# Patient Record
Sex: Female | Born: 1985 | Race: White | Hispanic: No | Marital: Married | Smoking: Never smoker
Health system: Southern US, Community
[De-identification: ages and names within clinical notes are randomized; demographics above are authoritative.]

## PROBLEM LIST (undated history)

## (undated) DIAGNOSIS — E559 Vitamin D deficiency, unspecified: Secondary | ICD-10-CM

## (undated) DIAGNOSIS — G4730 Sleep apnea, unspecified: Secondary | ICD-10-CM

## (undated) DIAGNOSIS — D649 Anemia, unspecified: Secondary | ICD-10-CM

## (undated) DIAGNOSIS — M549 Dorsalgia, unspecified: Secondary | ICD-10-CM

## (undated) DIAGNOSIS — K219 Gastro-esophageal reflux disease without esophagitis: Secondary | ICD-10-CM

## (undated) DIAGNOSIS — E669 Obesity, unspecified: Secondary | ICD-10-CM

## (undated) DIAGNOSIS — M2550 Pain in unspecified joint: Secondary | ICD-10-CM

## (undated) HISTORY — PX: TONSILLECTOMY: SUR1361

## (undated) HISTORY — DX: Dorsalgia, unspecified: M54.9

## (undated) HISTORY — PX: CHOLECYSTECTOMY: SHX55

## (undated) HISTORY — DX: Anemia, unspecified: D64.9

## (undated) HISTORY — DX: Vitamin D deficiency, unspecified: E55.9

## (undated) HISTORY — DX: Gastro-esophageal reflux disease without esophagitis: K21.9

## (undated) HISTORY — PX: TUBAL LIGATION: SHX77

## (undated) HISTORY — DX: Sleep apnea, unspecified: G47.30

## (undated) HISTORY — DX: Pain in unspecified joint: M25.50

---
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
## 2007-01-09 ENCOUNTER — Inpatient Hospital Stay (HOSPITAL_COMMUNITY): Admission: AD | Admit: 2007-01-09 | Discharge: 2007-01-09 | Payer: Self-pay | Admitting: Obstetrics and Gynecology

## 2007-01-22 ENCOUNTER — Inpatient Hospital Stay (HOSPITAL_COMMUNITY): Admission: AD | Admit: 2007-01-22 | Discharge: 2007-01-22 | Payer: Self-pay | Admitting: Obstetrics and Gynecology

## 2007-01-23 ENCOUNTER — Inpatient Hospital Stay (HOSPITAL_COMMUNITY): Admission: AD | Admit: 2007-01-23 | Discharge: 2007-01-24 | Payer: Self-pay | Admitting: Obstetrics & Gynecology

## 2007-01-30 ENCOUNTER — Inpatient Hospital Stay (HOSPITAL_COMMUNITY): Admission: RE | Admit: 2007-01-30 | Discharge: 2007-02-02 | Payer: Self-pay | Admitting: Obstetrics and Gynecology

## 2007-03-14 ENCOUNTER — Emergency Department: Payer: Self-pay | Admitting: Emergency Medicine

## 2007-03-29 ENCOUNTER — Emergency Department: Payer: Self-pay | Admitting: Emergency Medicine

## 2008-01-08 ENCOUNTER — Emergency Department (HOSPITAL_BASED_OUTPATIENT_CLINIC_OR_DEPARTMENT_OTHER): Admission: EM | Admit: 2008-01-08 | Discharge: 2008-01-08 | Payer: Self-pay | Admitting: Emergency Medicine

## 2009-09-13 ENCOUNTER — Emergency Department (HOSPITAL_BASED_OUTPATIENT_CLINIC_OR_DEPARTMENT_OTHER): Admission: EM | Admit: 2009-09-13 | Discharge: 2009-09-13 | Payer: Self-pay | Admitting: Emergency Medicine

## 2010-10-06 ENCOUNTER — Emergency Department (INDEPENDENT_AMBULATORY_CARE_PROVIDER_SITE_OTHER): Payer: Medicaid Other

## 2010-10-06 ENCOUNTER — Emergency Department (HOSPITAL_BASED_OUTPATIENT_CLINIC_OR_DEPARTMENT_OTHER)
Admission: EM | Admit: 2010-10-06 | Discharge: 2010-10-07 | Disposition: A | Discharge status: Home / Self Care | Payer: Medicaid Other | Attending: Emergency Medicine | Admitting: Emergency Medicine

## 2010-10-06 DIAGNOSIS — R51 Headache: Secondary | ICD-10-CM | POA: Insufficient documentation

## 2010-10-07 LAB — CBC
HCT: 38.8 % (ref 36.0–46.0)
Hemoglobin: 13.2 g/dL (ref 12.0–15.0)
MCH: 27 pg (ref 26.0–34.0)
MCHC: 34 g/dL (ref 30.0–36.0)
MCV: 79.3 fL (ref 78.0–100.0)

## 2010-10-07 LAB — BASIC METABOLIC PANEL
BUN: 9 mg/dL (ref 6–23)
CO2: 27 mEq/L (ref 19–32)
Calcium: 9.2 mg/dL (ref 8.4–10.5)
Creatinine, Ser: 0.8 mg/dL (ref 0.4–1.2)
Glucose, Bld: 87 mg/dL (ref 70–99)
Sodium: 143 mEq/L (ref 135–145)

## 2010-10-07 LAB — DIFFERENTIAL
Eosinophils Relative: 2 % (ref 0–5)
Lymphocytes Relative: 31 % (ref 12–46)
Lymphs Abs: 3.1 10*3/uL (ref 0.7–4.0)
Monocytes Absolute: 0.5 10*3/uL (ref 0.1–1.0)
Monocytes Relative: 5 % (ref 3–12)
Neutro Abs: 6.2 10*3/uL (ref 1.7–7.7)

## 2010-10-07 LAB — URINALYSIS, ROUTINE W REFLEX MICROSCOPIC
Ketones, ur: NEGATIVE mg/dL
Nitrite: NEGATIVE
Protein, ur: 30 mg/dL — AB

## 2010-10-07 LAB — URINE MICROSCOPIC-ADD ON

## 2010-10-07 LAB — PREGNANCY, URINE: Preg Test, Ur: NEGATIVE

## 2010-12-14 NOTE — Op Note (Signed)
NAME:  JORI, FRERICHS NO.:  [id number]   MEDICAL RECORD NO.:  [id number]          PATIENT TYPE:  INP   LOCATION:  9147                          FACILITY:  [facility]   PHYSICIAN:  Malva Limes, M.D.    DATE OF BIRTH:  10-17-1985   DATE OF PROCEDURE:  01/31/2007  DATE OF DISCHARGE:                               OPERATIVE REPORT   PREOPERATIVE DIAGNOSES:  1. Intrauterine pregnancy at term.  2. Failure to progress.   POSTOPERATIVE DIAGNOSES:  1. Intrauterine pregnancy at term.  2. Failure to progress.   PROCEDURE:  Primary low transverse cesarean section.   SURGEON:  Malva Limes, M.D.   ANESTHESIA:  Epidural.   ANTIBIOTIC:  Ancef one gram.   DRAINS:  Foley to bedside drainage.   ESTIMATED BLOOD LOSS:  900 mL.   COMPLICATIONS:  None.   SPECIMENS:  None.   FINDINGS:  The patient had normal fallopian tubes and ovaries  bilaterally.  The patient delivered one live, viable, white female infant,  weighing 7 pounds even, Apgars 9 at one minute and 10 at five minutes.   DESCRIPTION OF PROCEDURE:  The patient was taken to the operating room,  where she was placed in the dorsal supine position with a left lateral  tilt.  Once adequate level was reached, the patient was prepped with  Betadine and draped in the usual fashion for this procedure.  A  Pfannenstiel incision was made.  This was carried down through the  fascia.  The fascia was entered in the midline and extended laterally  with blunt dissection.  The fascia was then dissected from the rectus  muscles with the Bovie.  Rectus muscles were divided in the midline,  taken superiorly and inferiorly.  Parietal peritoneum was entered  sharply and taken superiorly and inferiorly.  The bladder flap was taken  down with sharp dissection.  A low transverse uterine incision was made  in the midline and extended laterally with blunt dissection.  Amniotic  fluid was noted to be clear.  The infant was delivered in the  vertex  presentation with the vacuum extractor.  There was a nuchal cord times  one.  Once the infant was delivered, the cord was doubly clamped and cut  and the infant handed to the waiting NICU team.  The placenta was then  manually removed.   The uterus was exteriorized.  The uterine cavity was cleaned with a wet  lap.  The uterine incision was closed with a single layer of 0 Monocryl  in a running, locking fashion.  The bladder flap was closed, using 2-0  Monocryl in a running fashion.  The uterus was placed back in the  abdominal cavity.  Hemostasis was checked and felt to be adequate.  Parietal peritoneum and rectus muscles were reapproximated in the  midline, using 0 Monocryl suture in a running fashion.  The fascia was  closed using 0 Monocryl suture in running fashion.  Subcuticular tissue  was made hemostatic with Bovie.  The subcuticular tissue, which was  rather thick, was closed using interrupted 2-0 plain gut suture.  Stainless steel clips were used to close the skin.   The patient tolerated the procedure well.  She was taken to the recovery  room in stable condition.  Instrument and lap counts were correct times  two.           ______________________________  Malva Limes, M.D.     MA/MEDQ  D:  01/31/2007  T:  01/31/2007  Job:  [id number]

## 2010-12-17 NOTE — Discharge Summary (Signed)
Deborah Mcbride, Deborah Mcbride NO.:  [id number]   MEDICAL RECORD NO.:  [id number]          PATIENT TYPE:  INP   LOCATION:  9147                          FACILITY:  [facility]   PHYSICIAN:  Ilda Mori, M.D.   DATE OF BIRTH:  [birth year]   DATE OF ADMISSION:  01/30/2007  DATE OF DISCHARGE:  02/02/2007                               DISCHARGE SUMMARY   FINAL DIAGNOSIS:  Intrauterine pregnancy at term, induction secondary to  possible macrosomia.  Failure to progress.   PROCEDURE:  Primary low transverse cesarean section.  Surgeon Dr. Malva Limes.   COMPLICATIONS:  None.   HISTORY OF PRESENT ILLNESS:  This 25 year old G1 P0 presents at [redacted] weeks  gestation for induction secondary to possible macrosomia.  The patient's  antepartum course up to this point had been complicated by late prenatal  care which she did not have any prenatal care until about 20 weeks.  She  is also a single female.  The patient did have a normal one-hour glucose  tolerance test and negative group B strep culture at 35 weeks.  The  patient was admitted on July 1 by Dr. Malva Limes.  She progressed to  about 6 cm of dilation.  Did not change over a four hour period.  A  discussion was held with the patient, and decision was made to proceed  with a cesarean section at this time secondary to failure to progress.  The patient was taken to the operating room on January 31, 2007, by Dr. Malva Limes where a primary low transverse cesarean section was performed  with the delivery of a 7 pounds 0 ounces female infant with Apgars of 9  and 10.  Delivery went without complications.  The patient's  postoperative course was benign without complications.  The patient does  not want her little boy circumcised before discharge.  The patient was  felt ready for discharge on postoperative day #3, was sent home on a  regular diet, told to decrease her activities, told to continue her  prenatal vitamins, was given Percocet  #36 one to two every 46 hours as  needed for the pain, told she could use over-the-counter ibuprofen up to  600 mg every 6 hours as needed for pain and was to follow up in our  office in 4 weeks.   LABORATORIES ON DISCHARGE:  The patient had a hemoglobin of 10.0, white  blood cell count of 14.1 and platelets of 2226,000.      Leilani Able, P.A.-C.      Ilda Mori, M.D.  Electronically Signed    MB/MEDQ  D:  03/07/2007  T:  03/07/2007  Job:  [id number]

## 2010-12-17 NOTE — Consult Note (Signed)
NAME:  Deborah Mcbride, Deborah Mcbride NO.:  [id number]   MEDICAL RECORD NO.:  [id number]          PATIENT TYPE:  OIB   LOCATION:  LDR2                          FACILITY:  [facility]   PHYSICIAN:  Tilda Burrow, M.D. DATE OF BIRTH:  [birth year]   DATE OF CONSULTATION:  10/29/2006  DATE OF DISCHARGE:                                 CONSULTATION   CHIEF COMPLAINT:  Epigastric pain x24 hours, mild lower abdominal pain.   HISTORY OF PRESENT ILLNESS:  This 26 year old gravida 1, para 0, EDC  February 01, 2007 at 26 weeks 3 days by LMP criteria.  She presents to [facility] labor and delivery complaining of epigastric discomfort  and lower abdominal discomfort noted while she was in church today at  the [employer] [facility] during services.  She describes the  service itself was uneventful.  The pain has been going on and off for  24 hours, became acutely worse during the service.  No bleeding, no  rupture of membranes.  The pain has resolved since she arrived here.  External monitoring shows a wonderfully reactive fetal heart rate  pattern.  No uterine activity.  Fetal heart rate is within normal range.  There is no bleeding, no gush of fluid.  In discussing with the patient,  the pain is not associated with bowel activity.  The lower abdominal  discomfort is very nonspecific in the inguinal area as noted.  Abdomen  is nontender, gravid uterus consistent with gestational age.  Pelvic  exam deferred.  Blood pressure is 136/58.   IMPRESSION:  1. Round ligament pain lower abdomen.  2. Epigastric discomfort, left upper quadrant predominance.  Probably      heartburn.   PLAN:  Maalox to be given prior to discharge as well as single dose  Protonix.  The patient is advised to use Maalox p.r.n. heartburn to sort  out etiology.  Return for headaches, right quadrant pain of a severe  nature.  Follow-up with Dr. Dareen Piano of [facility] next week if  symptoms persist.      Tilda Burrow, M.D.  Electronically Signed     JVF/MEDQ  D:  10/29/2006  T:  10/29/2006  Job:  [id number]   cc:   [facility]   Malva Limes, M.D.  Fax: [phone]

## 2011-04-28 LAB — URINALYSIS, ROUTINE W REFLEX MICROSCOPIC
Bilirubin Urine: NEGATIVE
Glucose, UA: NEGATIVE
Hgb urine dipstick: NEGATIVE
Protein, ur: NEGATIVE

## 2011-04-28 LAB — URINE MICROSCOPIC-ADD ON

## 2011-04-28 LAB — GC/CHLAMYDIA PROBE AMP, GENITAL: GC Probe Amp, Genital: NEGATIVE

## 2011-04-28 LAB — WET PREP, GENITAL: Trich, Wet Prep: NONE SEEN

## 2011-05-17 LAB — CBC
HCT: 29.2 — ABNORMAL LOW
Hemoglobin: 11.6 — ABNORMAL LOW
MCHC: 34
MCV: 81.6
MCV: 81.7
Platelets: 226
RBC: 4.17
RDW: 13.8

## 2011-05-18 LAB — URINALYSIS, ROUTINE W REFLEX MICROSCOPIC
Bilirubin Urine: NEGATIVE
Ketones, ur: NEGATIVE
Nitrite: NEGATIVE
Urobilinogen, UA: 0.2

## 2016-01-17 ENCOUNTER — Emergency Department (HOSPITAL_BASED_OUTPATIENT_CLINIC_OR_DEPARTMENT_OTHER): Payer: Medicaid Other

## 2016-01-17 ENCOUNTER — Emergency Department (HOSPITAL_BASED_OUTPATIENT_CLINIC_OR_DEPARTMENT_OTHER)
Admission: EM | Admit: 2016-01-17 | Discharge: 2016-01-18 | Disposition: A | Discharge status: Home / Self Care | Payer: Medicaid Other | Attending: Emergency Medicine | Admitting: Emergency Medicine

## 2016-01-17 ENCOUNTER — Encounter (HOSPITAL_BASED_OUTPATIENT_CLINIC_OR_DEPARTMENT_OTHER): Payer: Self-pay | Admitting: Emergency Medicine

## 2016-01-17 DIAGNOSIS — M25541 Pain in joints of right hand: Secondary | ICD-10-CM | POA: Diagnosis not present

## 2016-01-17 DIAGNOSIS — M79641 Pain in right hand: Secondary | ICD-10-CM | POA: Diagnosis present

## 2016-01-17 DIAGNOSIS — M2550 Pain in unspecified joint: Secondary | ICD-10-CM

## 2016-01-17 MED ORDER — HYDROCODONE-ACETAMINOPHEN 5-325 MG PO TABS
1.0000 | ORAL_TABLET | ORAL | Status: AC
  Administered 2016-01-17: 1 via ORAL
  Filled 2016-01-17: qty 1

## 2016-01-17 NOTE — ED Provider Notes (Signed)
CSN: [id number]     Arrival date & time 01/17/16  2200 History  By signing my name below, I, Marisue HumbleMichelle Chaffee, attest that this documentation has been prepared under the direction and in the presence of Linwood DibblesJon Trayonna Bachmeier, MD . Electronically Signed: Marisue HumbleMichelle Chaffee, Scribe. 01/17/2016. 11:02 PM.   Chief Complaint  Patient presents with  . Hand Pain   The history is provided by the patient. No language interpreter was used.   HPI Comments:  Neta MendsHeather D Englert is a 30 y.o. female who presents to the Emergency Department complaining of generalized right hand pain radiating up to right shoulder onset earlier today. Pt states she has not been using the hand more than normal. No alleviating factors noted or treatments attempted PTA. Pt is right handed. Denies injury, trauma, fever or chills.  History reviewed. No pertinent past medical history. Past Surgical History  Procedure Laterality Date  . Cholecystectomy    . Cesarean section    . Tubal ligation    . Tonsillectomy     History reviewed. No pertinent family history. Social History  Substance Use Topics  . Smoking status: Never Smoker   . Smokeless tobacco: None  . Alcohol Use: No   OB History    No data available     Review of Systems  Constitutional: Negative for fever and chills.  Musculoskeletal: Positive for arthralgias.  All other systems reviewed and are negative.   Allergies  Dilaudid  Home Medications   Prior to Admission medications   Medication Sig Start Date End Date Taking? Authorizing Provider  naproxen (NAPROSYN) 500 MG tablet Take 1 tablet (500 mg total) by mouth 2 (two) times daily. 01/18/16   Linwood DibblesJon Davidmichael Zarazua, MD   BP 130/44 mmHg  Pulse 80  Temp(Src) 98.2 F (36.8 C) (Oral)  Resp 20  Ht 5\' 3"  (1.6 m)  Wt 140.615 kg  BMI 54.93 kg/m2  SpO2 99%  LMP 12/21/2015   Physical Exam  Constitutional: She appears well-developed and well-nourished. No distress.  HENT:  Head: Normocephalic and atraumatic.  Right Ear:  External ear normal.  Left Ear: External ear normal.  Eyes: Conjunctivae are normal. Right eye exhibits no discharge. Left eye exhibits no discharge. No scleral icterus.  Neck: Neck supple. No tracheal deviation present.  Cardiovascular: Normal rate.   Pulmonary/Chest: Effort normal. No stridor. No respiratory distress.  Musculoskeletal: She exhibits no edema.       Right shoulder: Normal.       Right elbow: Normal.      Right wrist: She exhibits tenderness. She exhibits no swelling and no effusion.       Right hand: She exhibits tenderness. She exhibits no deformity, no laceration and no swelling.  No swelling, normal radial pulses  Neurological: She is alert. Cranial nerve deficit: no gross deficits.  Skin: Skin is warm and dry. No rash noted.  Psychiatric: She has a normal mood and affect.  Nursing note and vitals reviewed.   ED Course  Procedures  DIAGNOSTIC STUDIES:  Oxygen Saturation is 100% on RA, normal by my interpretation.    COORDINATION OF CARE:  10:59 PM Will order imaging. Discussed treatment plan with pt at bedside and pt agreed to plan.  Labs Review Labs Reviewed - No data to display  Imaging Review Dg Wrist Complete Right  01/18/2016  CLINICAL DATA:  30 year old female with right wrist pain EXAM: RIGHT HAND - COMPLETE 3+ VIEW; RIGHT WRIST - COMPLETE 3+ VIEW COMPARISON:  None. FINDINGS: There is no  evidence of fracture or dislocation. There is no evidence of arthropathy or other focal bone abnormality. Soft tissues are unremarkable. IMPRESSION: Negative. Electronically Signed   By: Elgie Collard M.D.   On: 01/18/2016 00:33   Dg Hand Complete Right  01/18/2016  CLINICAL DATA:  30 year old female with right wrist pain EXAM: RIGHT HAND - COMPLETE 3+ VIEW; RIGHT WRIST - COMPLETE 3+ VIEW COMPARISON:  None. FINDINGS: There is no evidence of fracture or dislocation. There is no evidence of arthropathy or other focal bone abnormality. Soft tissues are unremarkable.  IMPRESSION: Negative. Electronically Signed   By: Elgie Collard M.D.   On: 01/18/2016 00:33   I have personally reviewed and evaluated these images and lab results as part of my medical decision-making.    MDM   Final diagnoses:  Arthralgia   Normal pulses.  No sign of infection.  Doubt referred pain since she has tenderness to palpation.  Denies any recent injuries.  ?tendonities vs arthralgia  Will dc home on nsaids.  Follow up with PCP  I personally performed the services described in this documentation, which was scribed in my presence.  The recorded information has been reviewed and is accurate.    Linwood Dibbles, MD 01/18/16 0040

## 2016-01-17 NOTE — ED Notes (Signed)
Patient states that she started to have pain to her right hand starting in the middle of the day. Denies any injury reports " it just started hurting"

## 2016-01-18 MED ORDER — NAPROXEN 500 MG PO TABS: 500.0000 mg | ORAL_TABLET | Freq: Two times a day (BID) | ORAL | Status: DC

## 2016-01-18 NOTE — Discharge Instructions (Signed)

## 2016-02-14 ENCOUNTER — Encounter (HOSPITAL_BASED_OUTPATIENT_CLINIC_OR_DEPARTMENT_OTHER): Payer: Self-pay | Admitting: *Deleted

## 2016-02-14 ENCOUNTER — Emergency Department (HOSPITAL_BASED_OUTPATIENT_CLINIC_OR_DEPARTMENT_OTHER)
Admission: EM | Admit: 2016-02-14 | Discharge: 2016-02-15 | Disposition: A | Discharge status: Home / Self Care | Payer: Medicaid Other | Attending: Emergency Medicine | Admitting: Emergency Medicine

## 2016-02-14 DIAGNOSIS — K029 Dental caries, unspecified: Secondary | ICD-10-CM | POA: Diagnosis not present

## 2016-02-14 DIAGNOSIS — K0889 Other specified disorders of teeth and supporting structures: Secondary | ICD-10-CM | POA: Diagnosis present

## 2016-02-14 MED ORDER — NAPROXEN 250 MG PO TABS
500.0000 mg | ORAL_TABLET | Freq: Once | ORAL | Status: AC
  Administered 2016-02-14: 500 mg via ORAL
  Filled 2016-02-14: qty 2

## 2016-02-14 MED ORDER — PENICILLIN V POTASSIUM 250 MG PO TABS
500.0000 mg | ORAL_TABLET | Freq: Once | ORAL | Status: AC
  Administered 2016-02-14: 500 mg via ORAL
  Filled 2016-02-14: qty 2

## 2016-02-14 MED ORDER — LIDOCAINE VISCOUS 2 % MT SOLN
15.0000 mL | Freq: Once | OROMUCOSAL | Status: AC
  Administered 2016-02-14: 15 mL via OROMUCOSAL
  Filled 2016-02-14: qty 15

## 2016-02-14 NOTE — ED Provider Notes (Signed)
CSN: [id number]     Arrival date & time 02/14/16  2127 History  By signing my name below, I, Deborah Mcbride, attest that this documentation has been prepared under the direction and in the presence of Wynonia Medero, MD . Electronically Signed: Levon HedgerElizabeth Mcbride, Scribe. 02/14/2016. 11:39 PM.   Chief Complaint  Patient presents with  . Dental Pain   Patient is a 30 y.o. female presenting with tooth pain. The history is provided by the patient. No language interpreter was used.  Dental Pain Location:  Upper Upper teeth location:  15/LU 2nd molar Quality:  Dull Severity:  Severe Onset quality:  Gradual Timing:  Constant Progression:  Unchanged Chronicity:  Recurrent Context: dental caries   Previous work-up:  Dental exam and filled cavity Relieved by:  Nothing Worsened by:  Nothing tried Ineffective treatments:  None tried Associated symptoms: no congestion, no facial swelling and no fever   Risk factors: no diabetes     HPI Comments:  Deborah Mcbride is a 30 y.o. female who presents to the Emergency Department complaining of left sided upper dental pain with radiation to entire mouth onset yesterday. Pain worsened by movement. Pt notes associated difficulty swallowing. She states she has a broken tooth that was previously filled and capped, but the cap broke. No other complaints at this time.  History reviewed. No pertinent past medical history. Past Surgical History  Procedure Laterality Date  . Cholecystectomy    . Cesarean section    . Tubal ligation    . Tonsillectomy     History reviewed. No pertinent family history. Social History  Substance Use Topics  . Smoking status: Never Smoker   . Smokeless tobacco: None  . Alcohol Use: No   OB History    No data available     Review of Systems  Constitutional: Negative for fever.  HENT: Positive for dental problem and trouble swallowing. Negative for congestion and facial swelling.   All other systems reviewed and are  negative.   Allergies  Dilaudid  Home Medications   Prior to Admission medications   Not on File   BP 158/92 mmHg  Pulse 90  Temp(Src) 98.5 F (36.9 C) (Oral)  Resp 18  Ht 5\' 3"  (1.6 m)  Wt 300 lb (136.079 kg)  BMI 53.16 kg/m2  SpO2 98%  LMP 01/29/2016 Physical Exam  Constitutional: She is oriented to person, place, and time. She appears well-developed and well-nourished. No distress.  HENT:  Head: Normocephalic and atraumatic.  Mouth/Throat: Oropharynx is clear and moist. No trismus in the jaw. Dental caries present. No oropharyngeal exudate.  Moist mucous membranes  Left upper 2nd molar ellis tooth fracture  Eyes: Conjunctivae are normal. Pupils are equal, round, and reactive to light.  Neck: No JVD present.  Trachea midline No lymphadenopathy No bruit  Cardiovascular: Normal rate, regular rhythm and normal heart sounds.   Pulmonary/Chest: Effort normal and breath sounds normal. No stridor. No respiratory distress.  Abdominal: Soft. Bowel sounds are normal. She exhibits no distension.  Neurological: She is alert and oriented to person, place, and time. She has normal reflexes.  Skin: Skin is warm and dry.  Psychiatric: She has a normal mood and affect. Her behavior is normal.  Nursing note and vitals reviewed.   ED Course  Procedures  DIAGNOSTIC STUDIES:  Oxygen Saturation is 98% on RA, normal by my interpretation.    COORDINATION OF CARE:  11:36 PM Discussed treatment plan with pt at bedside and pt agreed to  plan.  Labs Review Labs Reviewed - No data to display  Imaging Review No results found. I have personally reviewed and evaluated these images and lab results as part of my medical decision-making.   EKG Interpretation None      MDM   Final diagnoses:  None    Filed Vitals:   02/14/16 2146 02/14/16 2340  BP: 158/92 137/69  Pulse: 90 80  Temp: 98.5 F (36.9 C) 98.1 F (36.7 C)  Resp: 18 20   Medications  penicillin v potassium  (VEETID) tablet 500 mg (500 mg Oral Given 02/14/16 2346)  naproxen (NAPROSYN) tablet 500 mg (500 mg Oral Given 02/14/16 2347)  lidocaine (XYLOCAINE) 2 % viscous mouth solution 15 mL (15 mLs Mouth/Throat Given 02/14/16 2348)     Medication List    TAKE these medications        meloxicam 15 MG tablet  Commonly known as:  MOBIC  Take 1 tablet (15 mg total) by mouth daily.     penicillin v potassium 500 MG tablet  Commonly known as:  VEETID  Take 1 tablet (500 mg total) by mouth 4 (four) times daily.      All questions answered to patient's satisfaction. Based on history and exam patient has been appropriately medically screened and emergency conditions excluded. Patient is stable for discharge at this time. Follow up provided and strict return precautions given  I personally performed the services described in this documentation, which was scribed in my presence. The recorded information has been reviewed and is accurate.     Cy Blamer, MD 02/15/16 [phone]

## 2016-02-14 NOTE — ED Notes (Signed)
MD at bedside. 

## 2016-02-14 NOTE — ED Notes (Signed)
Dental pain x 2 days. No relief with OTC meds.

## 2016-02-14 NOTE — ED Notes (Signed)
Dental pain from a broken tooth x 2 days.

## 2016-02-15 MED ORDER — MELOXICAM 15 MG PO TABS: 15.0000 mg | ORAL_TABLET | Freq: Every day | ORAL | Status: DC

## 2016-02-15 MED ORDER — PENICILLIN V POTASSIUM 500 MG PO TABS: 500.0000 mg | ORAL_TABLET | Freq: Four times a day (QID) | ORAL | Status: AC

## 2016-02-15 NOTE — Discharge Instructions (Signed)
Dental Care and Dentist Visits °Dental care supports good overall health. Regular dental visits can also help you avoid dental pain, bleeding, infection, and other more serious health problems in the future. It is important to keep the mouth healthy because diseases in the teeth, gums, and other oral tissues can spread to other areas of the body. Some problems, such as diabetes, heart disease, and pre-term labor have been associated with poor oral health.  °See your dentist every 6 months. If you experience emergency problems such as a toothache or broken tooth, go to the dentist right away. If you see your dentist regularly, you may catch problems early. It is easier to be treated for problems in the early stages.  °WHAT TO EXPECT AT A DENTIST VISIT  °Your dentist will look for many common oral health problems and recommend proper treatment. At your regular dental visit, you can expect: °· Gentle cleaning of the teeth and gums. This includes scraping and polishing. This helps to remove the sticky substance around the teeth and gums (plaque). Plaque forms in the mouth shortly after eating. Over time, plaque hardens on the teeth as tartar. If tartar is not removed regularly, it can cause problems. Cleaning also helps remove stains. °· Periodic X-rays. These pictures of the teeth and supporting bone will help your dentist assess the health of your teeth. °· Periodic fluoride treatments. Fluoride is a natural mineral shown to help strengthen teeth. Fluoride treatment involves applying a fluoride gel or varnish to the teeth. It is most commonly done in children. °· Examination of the mouth, tongue, jaws, teeth, and gums to look for any oral health problems, such as: °¨ Cavities (dental caries). This is decay on the tooth caused by plaque, sugar, and acid in the mouth. It is best to catch a cavity when it is small. °¨ Inflammation of the gums caused by plaque buildup (gingivitis). °¨ Problems with the mouth or malformed  or misaligned teeth. °¨ Oral cancer or other diseases of the soft tissues or jaws.  °KEEP YOUR TEETH AND GUMS HEALTHY °For healthy teeth and gums, follow these general guidelines as well as your dentist's specific advice: °· Have your teeth professionally cleaned at the dentist every 6 months. °· Brush twice daily with a fluoride toothpaste. °· Floss your teeth daily.  °· Ask your dentist if you need fluoride supplements, treatments, or fluoride toothpaste. °· Eat a healthy diet. Reduce foods and drinks with added sugar. °· Avoid smoking. °TREATMENT FOR ORAL HEALTH PROBLEMS °If you have oral health problems, treatment varies depending on the conditions present in your teeth and gums. °· Your caregiver will most likely recommend good oral hygiene at each visit. °· For cavities, gingivitis, or other oral health disease, your caregiver will perform a procedure to treat the problem. This is typically done at a separate appointment. Sometimes your caregiver will refer you to another dental specialist for specific tooth problems or for surgery. °SEEK IMMEDIATE DENTAL CARE IF: °· You have pain, bleeding, or soreness in the gum, tooth, jaw, or mouth area. °· A permanent tooth becomes loose or separated from the gum socket. °· You experience a blow or injury to the mouth or jaw area. °  °This information is not intended to replace advice given to you by your health care provider. Make sure you discuss any questions you have with your health care provider. °  °Document Released: 03/30/2011 Document Revised: 10/10/2011 Document Reviewed: 03/30/2011 °Elsevier Interactive Patient Education ©2016 [employer] ° °

## 2016-02-15 NOTE — ED Notes (Signed)
Pt given d/c instructions as per chart. Rx x 2. Verbalizes understanding. No questions. 

## 2016-05-16 ENCOUNTER — Encounter (HOSPITAL_BASED_OUTPATIENT_CLINIC_OR_DEPARTMENT_OTHER): Payer: Self-pay

## 2016-05-16 ENCOUNTER — Emergency Department (HOSPITAL_BASED_OUTPATIENT_CLINIC_OR_DEPARTMENT_OTHER): Payer: Medicaid Other

## 2016-05-16 ENCOUNTER — Emergency Department (HOSPITAL_BASED_OUTPATIENT_CLINIC_OR_DEPARTMENT_OTHER)
Admission: EM | Admit: 2016-05-16 | Discharge: 2016-05-17 | Disposition: A | Discharge status: Home / Self Care | Payer: Medicaid Other | Attending: Emergency Medicine | Admitting: Emergency Medicine

## 2016-05-16 DIAGNOSIS — N390 Urinary tract infection, site not specified: Secondary | ICD-10-CM | POA: Insufficient documentation

## 2016-05-16 DIAGNOSIS — R1031 Right lower quadrant pain: Secondary | ICD-10-CM | POA: Diagnosis present

## 2016-05-16 DIAGNOSIS — N3000 Acute cystitis without hematuria: Secondary | ICD-10-CM

## 2016-05-16 DIAGNOSIS — I880 Nonspecific mesenteric lymphadenitis: Secondary | ICD-10-CM

## 2016-05-16 LAB — URINALYSIS, ROUTINE W REFLEX MICROSCOPIC
Bilirubin Urine: NEGATIVE
GLUCOSE, UA: NEGATIVE mg/dL
HGB URINE DIPSTICK: NEGATIVE
Ketones, ur: NEGATIVE mg/dL
Nitrite: NEGATIVE
PROTEIN: NEGATIVE mg/dL
SPECIFIC GRAVITY, URINE: 1.021 (ref 1.005–1.030)
pH: 7.5 (ref 5.0–8.0)

## 2016-05-16 LAB — BASIC METABOLIC PANEL
ANION GAP: 8 (ref 5–15)
BUN: 10 mg/dL (ref 6–20)
CALCIUM: 9.4 mg/dL (ref 8.9–10.3)
CHLORIDE: 103 mmol/L (ref 101–111)
CO2: 25 mmol/L (ref 22–32)
Creatinine, Ser: 0.72 mg/dL (ref 0.44–1.00)
GFR calc Af Amer: >60 mL/min (ref >60)
GFR calc non Af Amer: >60 mL/min (ref >60)
GLUCOSE: 92 mg/dL (ref 65–99)
POTASSIUM: 3.7 mmol/L (ref 3.5–5.1)
Sodium: 136 mmol/L (ref 135–145)

## 2016-05-16 LAB — URINE MICROSCOPIC-ADD ON

## 2016-05-16 LAB — CBC WITH DIFFERENTIAL/PLATELET
BAND NEUTROPHILS: 2 %
BASOS ABS: 0 10*3/uL (ref 0.0–0.1)
BASOS PCT: 0 %
Eosinophils Absolute: 0.2 10*3/uL (ref 0.0–0.7)
Eosinophils Relative: 2 %
HEMATOCRIT: 39.5 % (ref 36.0–46.0)
Hemoglobin: 13.1 g/dL (ref 12.0–15.0)
LYMPHS ABS: 3.5 10*3/uL (ref 0.7–4.0)
LYMPHS PCT: 30 %
MCH: 27.3 pg (ref 26.0–34.0)
MCHC: 33.2 g/dL (ref 30.0–36.0)
MCV: 82.5 fL (ref 78.0–100.0)
Metamyelocytes Relative: 2 %
Monocytes Absolute: 0.6 10*3/uL (ref 0.1–1.0)
Monocytes Relative: 5 %
Neutro Abs: 7.5 10*3/uL (ref 1.7–7.7)
Neutrophils Relative %: 59 %
Platelets: 409 10*3/uL — ABNORMAL HIGH (ref 150–400)
RBC: 4.79 MIL/uL (ref 3.87–5.11)
RDW: 15.4 % (ref 11.5–15.5)
WBC: 11.8 10*3/uL — ABNORMAL HIGH (ref 4.0–10.5)

## 2016-05-16 LAB — PREGNANCY, URINE: Preg Test, Ur: NEGATIVE

## 2016-05-16 MED ORDER — IOPAMIDOL (ISOVUE-300) INJECTION 61%
100.0000 mL | Freq: Once | INTRAVENOUS | Status: AC | PRN
  Administered 2016-05-16: 100 mL via INTRAVENOUS

## 2016-05-16 MED ORDER — CEPHALEXIN 500 MG PO CAPS: 500.0000 mg | ORAL_CAPSULE | Freq: Two times a day (BID) | ORAL | 0 refills | Status: DC

## 2016-05-16 NOTE — Discharge Instructions (Signed)
Take antibiotics and as prescribed for potential urinary tract infection.  Return for worsening symptoms, including fever, worsening pain, intractable vomiting or any other symptoms concerning to you.

## 2016-05-16 NOTE — ED Triage Notes (Signed)
Pt c/o lower abdominal pain for the last few days with painful urination.  No n/v/d, also c/o lower back pain that is worse with sitting or standing for long periods of time.  Pt denies vaginal discharge and fevers as well.

## 2016-05-16 NOTE — ED Provider Notes (Signed)
[facility] Provider Note   CSN: [id number] Arrival date & time: 05/16/16  2029  By signing my name below, I, Soijett Blue, attest that this documentation has been prepared under the direction and in the presence of Lavera Guise, MD. Electronically Signed: Soijett Blue, ED Scribe. 05/16/16. 9:24 PM.   History   Chief Complaint Chief Complaint  Patient presents with  . Abdominal Pain    HPI  Deborah Mcbride is a 30 y.o. female who presents to the Emergency Department complaining of 7/10, constant, RLQ abdominal pain onset 2 days. Pt denies having an UTI in the past. She states that she is having associated symptoms of lower back pain and abdominal pain with urination. She states that she has not tried any medications for the relief of her symptoms. She denies diarrhea, vaginal bleeding, vaginal discharge, fever, urinary frequency, and any other symptoms. Pt reports that she has had two C-sections and a tubal ligation.    The history is provided by the patient. No language interpreter was used.    History reviewed. No pertinent past medical history.  There are no active problems to display for this patient.   Past Surgical History:  Procedure Laterality Date  . CESAREAN SECTION    . CHOLECYSTECTOMY    . TONSILLECTOMY    . TUBAL LIGATION      OB History    No data available       Home Medications    Prior to Admission medications   Medication Sig Start Date End Date Taking? Authorizing Provider  cephALEXin (KEFLEX) 500 MG capsule Take 1 capsule (500 mg total) by mouth 2 (two) times daily. 05/16/16   Lavera Guise, MD  meloxicam (MOBIC) 15 MG tablet Take 1 tablet (15 mg total) by mouth daily. 02/15/16   April Palumbo, MD    Family History No family history on file.  Social History Social History  Substance Use Topics  . Smoking status: Never Smoker  . Smokeless tobacco: Not on file  . Alcohol use No     Allergies   Dilaudid [hydromorphone  hcl]   Review of Systems Review of Systems 10/14 systems reviewed and are negative other than those stated in the HPI  Physical Exam Updated Vital Signs BP 109/59 (BP Location: Right Arm)   Pulse 80   Temp 98.1 F (36.7 C) (Oral)   Resp 19   Ht 5\' 3"  (1.6 m)   Wt (!) 322 lb 8 oz (146.3 kg)   LMP 03/23/2016 Comment: (-) urine preg//a.c.  SpO2 97%   BMI 57.13 kg/m   Physical Exam Physical Exam  Nursing note and vitals reviewed. Constitutional: Well developed, well nourished, non-toxic, and in no acute distress Head: Normocephalic and atraumatic.  Mouth/Throat: Oropharynx is clear and moist.  Neck: Normal range of motion. Neck supple.  Cardiovascular: Normal rate and regular rhythm.   Pulmonary/Chest: Effort normal and breath sounds normal.  Abdominal: Soft. There is no tenderness. There is no rebound and no guarding. Non-distended. Bilateral lower abdominal TTP. Bilateral flank tenderness to palpation. No significant CVA tenderenss.  Musculoskeletal: Normal range of motion.  Neurological: Alert, no facial droop, fluent speech, moves all extremities symmetrically Skin: Skin is warm and dry.  Psychiatric: Cooperative  ED Treatments / Results  DIAGNOSTIC STUDIES: Oxygen Saturation is 98% on RA, nl by my interpretation.    COORDINATION OF CARE: 9:23 PM Discussed treatment plan with pt at bedside which includes UA, labs, CT abdomen pelvis, and pt  agreed to plan.   Labs (all labs ordered are listed, but only abnormal results are displayed) Labs Reviewed  URINALYSIS, ROUTINE W REFLEX MICROSCOPIC (NOT AT [facility]) - Abnormal; Notable for the following:       Result Value   APPearance CLOUDY (*)    Leukocytes, UA MODERATE (*)    All other components within normal limits  URINE MICROSCOPIC-ADD ON - Abnormal; Notable for the following:    Squamous Epithelial / LPF 6-30 (*)    Bacteria, UA MANY (*)    All other components within normal limits  CBC WITH DIFFERENTIAL/PLATELET -  Abnormal; Notable for the following:    WBC 11.8 (*)    Platelets 409 (*)    All other components within normal limits  URINE CULTURE  PREGNANCY, URINE  BASIC METABOLIC PANEL    Radiology Ct Abdomen Pelvis W Contrast  Result Date: 05/17/2016 CLINICAL DATA:  Right lower quadrant, suprapubic and bilateral flank pain for 3 days. EXAM: CT ABDOMEN AND PELVIS WITH CONTRAST TECHNIQUE: Multidetector CT imaging of the abdomen and pelvis was performed using the standard protocol following bolus administration of intravenous contrast. CONTRAST:  100mL ISOVUE-300 IOPAMIDOL (ISOVUE-300) INJECTION 61% COMPARISON:  08/16/2011 CT FINDINGS: LOWER CHEST: Lung bases are clear. Included heart size is normal. No pericardial effusion. HEPATOBILIARY: Hepatic steatosis without space-occupying mass. Cholecystectomy. No biliary dilatation. PANCREAS: Normal. SPLEEN: Normal. ADRENALS/URINARY TRACT: Kidneys are orthotopic, demonstrating symmetric enhancement. No nephrolithiasis, hydronephrosis or solid renal masses. The unopacified ureters are normal in course and caliber. Urinary bladder is partially distended and unremarkable. Normal adrenal glands. STOMACH/BOWEL: The stomach, small and large bowel are normal in course and caliber without inflammatory changes. The appendix is not visualized overt no pericecal inflammation is noted. VASCULAR/LYMPHATIC: No aortic aneurysm. No significant calcific atherosclerosis. Small mesenteric lymph nodes are stable in appearance in the right lower quadrant the largest approximately 11 mm. REPRODUCTIVE: Normal. OTHER: No intraperitoneal free fluid or free air. MUSCULOSKELETAL: Chronic bilateral pars defects at L5. No spondylolisthesis. IMPRESSION: 1. Hepatic steatosis. 2. Cholecystectomy. 3. Small right lower quadrant mesenteric lymph nodes. Mesenteric adenitis is not entirely excluded. 4. Appendix is not visualized but no pericecal inflammation is noted. Electronically Signed   By: Tollie Ethavid  Kwon  M.D.   On: 05/17/2016 00:16    Procedures Procedures (including critical care time)  Medications Ordered in ED Medications  iopamidol (ISOVUE-300) 61 % injection 100 mL (100 mLs Intravenous Contrast Given 05/16/16 2312)     Initial Impression / Assessment and Plan / ED Course  I have reviewed the triage vital signs and the nursing notes.  Pertinent labs & imaging results that were available during my care of the patient were reviewed by me and considered in my medical decision making (see chart for details).  Clinical Course    Presenting with 2 days of RLQ abdominal pain with some urinary complaints. Afebrile and hemodynamically stable. Blood work with mild leukocytosis. Abdomen soft and nonsurgical with suprapubic and RLQ pain.  UA with signs of infection. Will obtain CT abd/pelvis to rule out acute appendicitis. If negative will treat outpatient for UTI with course of keflex.   Final Clinical Impressions(s) / ED Diagnoses   Final diagnoses:  Acute cystitis without hematuria  Lower urinary tract infectious disease  Mesenteric adenitis    New Prescriptions Discharge Medication List as of 05/17/2016 12:24 AM    START taking these medications   Details  cephALEXin (KEFLEX) 500 MG capsule Take 1 capsule (500 mg total) by mouth 2 (two)  times daily., Starting Mon 05/16/2016, Print        I personally performed the services described in this documentation, which was scribed in my presence. The recorded information has been reviewed and is accurate.     Lavera Guise, MD 05/17/16 [phone]

## 2016-05-18 LAB — URINE CULTURE

## 2017-01-19 ENCOUNTER — Encounter (HOSPITAL_BASED_OUTPATIENT_CLINIC_OR_DEPARTMENT_OTHER): Payer: Self-pay | Admitting: *Deleted

## 2017-01-19 DIAGNOSIS — K0889 Other specified disorders of teeth and supporting structures: Secondary | ICD-10-CM | POA: Diagnosis present

## 2017-01-19 DIAGNOSIS — Z5321 Procedure and treatment not carried out due to patient leaving prior to being seen by health care provider: Secondary | ICD-10-CM | POA: Diagnosis not present

## 2017-01-19 NOTE — ED Triage Notes (Signed)
Pt c/o dental pain x 3 days.

## 2017-01-20 ENCOUNTER — Emergency Department (HOSPITAL_BASED_OUTPATIENT_CLINIC_OR_DEPARTMENT_OTHER)
Admission: EM | Admit: 2017-01-20 | Discharge: 2017-01-20 | Disposition: A | Discharge status: Home / Self Care | Payer: Medicaid Other | Attending: Emergency Medicine | Admitting: Emergency Medicine

## 2017-05-15 ENCOUNTER — Emergency Department (HOSPITAL_BASED_OUTPATIENT_CLINIC_OR_DEPARTMENT_OTHER)
Admission: EM | Admit: 2017-05-15 | Discharge: 2017-05-15 | Disposition: A | Discharge status: Home / Self Care | Payer: Medicaid Other | Attending: Emergency Medicine | Admitting: Emergency Medicine

## 2017-05-15 ENCOUNTER — Encounter (HOSPITAL_BASED_OUTPATIENT_CLINIC_OR_DEPARTMENT_OTHER): Payer: Self-pay

## 2017-05-15 DIAGNOSIS — M5489 Other dorsalgia: Secondary | ICD-10-CM | POA: Diagnosis not present

## 2017-05-15 DIAGNOSIS — M549 Dorsalgia, unspecified: Secondary | ICD-10-CM

## 2017-05-15 MED ORDER — KETOROLAC TROMETHAMINE 60 MG/2ML IM SOLN
60.0000 mg | Freq: Once | INTRAMUSCULAR | Status: DC
  Filled 2017-05-15: qty 2

## 2017-05-15 MED ORDER — IBUPROFEN 200 MG PO TABS
600.0000 mg | ORAL_TABLET | Freq: Once | ORAL | Status: AC
  Administered 2017-05-15: 600 mg via ORAL
  Filled 2017-05-15: qty 1

## 2017-05-15 MED ORDER — PREDNISONE 20 MG PO TABS: 40.0000 mg | ORAL_TABLET | Freq: Every day | ORAL | 0 refills | Status: AC

## 2017-05-15 MED ORDER — PREDNISONE 50 MG PO TABS
60.0000 mg | ORAL_TABLET | Freq: Once | ORAL | Status: AC
  Administered 2017-05-15: 60 mg via ORAL
  Filled 2017-05-15: qty 1

## 2017-05-15 MED ORDER — IBUPROFEN 400 MG PO TABS: 400.0000 mg | ORAL_TABLET | Freq: Three times a day (TID) | ORAL | 0 refills | Status: DC | PRN

## 2017-05-15 MED ORDER — CYCLOBENZAPRINE HCL 5 MG PO TABS
5.0000 mg | ORAL_TABLET | Freq: Once | ORAL | Status: AC
  Administered 2017-05-15: 5 mg via ORAL
  Filled 2017-05-15: qty 1

## 2017-05-15 MED ORDER — OXYCODONE-ACETAMINOPHEN 5-325 MG PO TABS
1.0000 | ORAL_TABLET | Freq: Once | ORAL | Status: AC
  Administered 2017-05-15: 1 via ORAL
  Filled 2017-05-15: qty 1

## 2017-05-15 MED ORDER — CYCLOBENZAPRINE HCL 10 MG PO TABS: 10.0000 mg | ORAL_TABLET | Freq: Three times a day (TID) | ORAL | 0 refills | Status: DC | PRN

## 2017-05-15 NOTE — ED Provider Notes (Signed)
[facility]   CSN: [id number] Arrival date & time: 05/15/17  1104     History   Chief Complaint Chief Complaint  Patient presents with  . Back Pain    HPI Deborah Mcbride is a 31 y.o. female.  HPI Patient said 31 year old female who reports 4 days of worsening right-sided back pain including both her upper and lower back.  No recent injury or heavy lifting.  Denies fevers and chills.  Denies weakness of her lower extremities. No history of IV drug abuse. o use of anticoagulants.  No recent back surgery. No chest pain or shortness of breath.  No recent cough.  Denies dysuria or urinary frequency.  No vaginal complaints. Patient reports that she is intermittently had back pain before but reports this is more severe than usual.    History reviewed. No pertinent past medical history.  There are no active problems to display for this patient.   Past Surgical History:  Procedure Laterality Date  . CESAREAN SECTION    . CHOLECYSTECTOMY    . TONSILLECTOMY    . TUBAL LIGATION      OB History    No data available       Home Medications    Prior to Admission medications   Medication Sig Start Date End Date Taking? Authorizing Provider  cyclobenzaprine (FLEXERIL) 10 MG tablet Take 1 tablet (10 mg total) by mouth 3 (three) times daily as needed for muscle spasms. 05/15/17   Azalia Bilis, MD  ibuprofen (ADVIL,MOTRIN) 400 MG tablet Take 1 tablet (400 mg total) by mouth every 8 (eight) hours as needed. 05/15/17   Azalia Bilis, MD  predniSONE (DELTASONE) 20 MG tablet Take 2 tablets (40 mg total) by mouth daily. 05/15/17 05/20/17  Azalia Bilis, MD    Family History No family history on file.  Social History Social History  Substance Use Topics  . Smoking status: Never Smoker  . Smokeless tobacco: Never Used  . Alcohol use No     Allergies   Dilaudid [hydromorphone hcl]   Review of Systems Review of Systems  All other systems reviewed  and are negative.    Physical Exam Updated Vital Signs BP (!) 122/57 (BP Location: Right Arm)   Pulse 87   Temp 98.3 F (36.8 C) (Oral)   Resp 20   Ht  (1.6 m)   Wt (!) 144.2 kg (318 lb)   LMP 04/21/2017   SpO2 99%   BMI 56.33 kg/m   Physical Exam  Constitutional: She is oriented to person, place, and time. She appears well-developed and well-nourished.  Morbidly obese  HENT:  Head: Normocephalic.  Eyes: EOM are normal.  Neck: Normal range of motion.  Cardiovascular: Normal rate.   Pulmonary/Chest: Effort normal and breath sounds normal.  Abdominal: She exhibits no distension. There is no tenderness.  Musculoskeletal:  full range of motion of right knee and right hip.  Normal pulses right foot.  Mild right sided parathoracic and paralumbar tenderness without significant spasm. No rash.  No erythema  Neurological: She is alert and oriented to person, place, and time.  Psychiatric: She has a normal mood and affect.  Nursing note and vitals reviewed.    ED Treatments / Results  Labs (all labs ordered are listed, but only abnormal results are displayed) Labs Reviewed - No data to display  EKG  EKG Interpretation None       Radiology No results found.  Procedures Procedures (including critical care  time)  Medications Ordered in ED Medications  ketorolac (TORADOL) injection 60 mg (not administered)  predniSONE (DELTASONE) tablet 60 mg (not administered)  cyclobenzaprine (FLEXERIL) tablet 5 mg (not administered)  oxyCODONE-acetaminophen (PERCOCET/ROXICET) 5-325 MG per tablet 1 tablet (not administered)     Initial Impression / Assessment and Plan / ED Course  I have reviewed the triage vital signs and the nursing notes.  Pertinent labs & imaging results that were available during my care of the patient were reviewed by me and considered in my medical decision making (see chart for details).     Appears to be muscle skeletal upper lower back pain.   Home with anti-inflammatories and muscle relaxants as well as a short course of steroids for inflammation.  Pain treated in the emergency department.  No weakness of her lower extremities.  No thoracic or abdominal complaints  Patient understands return to the ER for new or worsening symptoms.  Recommended close primary care follow-up.  Final Clinical Impressions(s) / ED Diagnoses   Final diagnoses:  Upper back pain    New Prescriptions New Prescriptions   CYCLOBENZAPRINE (FLEXERIL) 10 MG TABLET    Take 1 tablet (10 mg total) by mouth 3 (three) times daily as needed for muscle spasms.   IBUPROFEN (ADVIL,MOTRIN) 400 MG TABLET    Take 1 tablet (400 mg total) by mouth every 8 (eight) hours as needed.   PREDNISONE (DELTASONE) 20 MG TABLET    Take 2 tablets (40 mg total) by mouth daily.     Azalia Bilis, MD 05/15/17 1149

## 2017-05-15 NOTE — ED Triage Notes (Signed)
C/o pain to entire back x 4 days-denies injury-ambulated into ED presents to triage in w/c

## 2017-06-11 ENCOUNTER — Encounter (HOSPITAL_BASED_OUTPATIENT_CLINIC_OR_DEPARTMENT_OTHER): Payer: Self-pay | Admitting: Emergency Medicine

## 2017-06-11 ENCOUNTER — Other Ambulatory Visit: Payer: Self-pay

## 2017-06-11 ENCOUNTER — Emergency Department (HOSPITAL_BASED_OUTPATIENT_CLINIC_OR_DEPARTMENT_OTHER)
Admission: EM | Admit: 2017-06-11 | Discharge: 2017-06-11 | Disposition: A | Discharge status: Home / Self Care | Payer: Medicaid Other | Attending: Emergency Medicine | Admitting: Emergency Medicine

## 2017-06-11 DIAGNOSIS — J3489 Other specified disorders of nose and nasal sinuses: Secondary | ICD-10-CM

## 2017-06-11 LAB — RAPID STREP SCREEN (MED CTR MEBANE ONLY): STREPTOCOCCUS, GROUP A SCREEN (DIRECT): NEGATIVE

## 2017-06-11 MED ORDER — SALINE SPRAY 0.65 % NA SOLN: 1.0000 | NASAL | 0 refills | Status: DC | PRN

## 2017-06-11 MED ORDER — FLUTICASONE PROPIONATE 50 MCG/ACT NA SUSP: 2.0000 | Freq: Every day | NASAL | 2 refills | Status: DC

## 2017-06-11 NOTE — Discharge Instructions (Signed)
You were seen today for congestion and upper respiratory symptoms.  You may have some sinus pressure.  Use Flonase and nasal saline to drain the sinuses.  You can use over-the-counter medications for systemic relief.  If you develop fevers or worsening symptoms she may need to be reevaluated.  At this time no antibiotics are indicated.  This is likely a viral infection.

## 2017-06-11 NOTE — ED Provider Notes (Signed)
[facility] EMERGENCY DEPARTMENT Provider Note   CSN: [id number] Arrival date & time: 06/11/17  2132     History   Chief Complaint No chief complaint on file.   HPI Deborah Mcbride is a 31 y.o. female.  HPI  This is a 31 year old female who presents with congestion, sinus pressure, and neck pain.  Patient reports 2-3-day history of worsening sinus congestion.  She has noted today that she feels like her neck is swollen.  Mild sore throat.  No fevers.  She took Sudafed, ibuprofen, and Tylenol with minimal relief.  No difficulty swallowing.  She denies any chest pain, shortness of breath, abdominal pain, nausea, vomiting, diarrhea.  She does endorse dry cough.  History reviewed. No pertinent past medical history.  There are no active problems to display for this patient.   Past Surgical History:  Procedure Laterality Date  . CESAREAN SECTION    . CHOLECYSTECTOMY    . TONSILLECTOMY    . TUBAL LIGATION      OB History    No data available       Home Medications    Prior to Admission medications   Medication Sig Start Date End Date Taking? Authorizing Provider  cyclobenzaprine (FLEXERIL) 10 MG tablet Take 1 tablet (10 mg total) by mouth 3 (three) times daily as needed for muscle spasms. 05/15/17   Azalia Bilisampos, Kevin, MD  fluticasone (FLONASE) 50 MCG/ACT nasal spray Place 2 sprays daily into both nostrils. 06/11/17   Horton, Mayer Maskerourtney F, MD  ibuprofen (ADVIL,MOTRIN) 400 MG tablet Take 1 tablet (400 mg total) by mouth every 8 (eight) hours as needed. 05/15/17   Azalia Bilisampos, Kevin, MD  sodium chloride (OCEAN) 0.65 % SOLN nasal spray Place 1 spray as needed into both nostrils for congestion. 06/11/17   Horton, Mayer Maskerourtney F, MD    Family History No family history on file.  Social History Social History   Tobacco Use  . Smoking status: Never Smoker  . Smokeless tobacco: Never Used  Substance Use Topics  . Alcohol use: No  . Drug use: No     Allergies   Dilaudid  [hydromorphone hcl]   Review of Systems Review of Systems  Constitutional: Negative for fever.  HENT: Positive for congestion, sinus pressure and sore throat. Negative for trouble swallowing.   Respiratory: Positive for cough. Negative for shortness of breath.   Cardiovascular: Negative for chest pain.  Gastrointestinal: Negative for abdominal pain, diarrhea and vomiting.  All other systems reviewed and are negative.    Physical Exam Updated Vital Signs BP (!) 148/67 (BP Location: Left Arm)   Pulse 81   Temp 97.8 F (36.6 C) (Oral)   Resp 17   LMP 05/28/2017   SpO2 98%   Physical Exam  Constitutional: She is oriented to person, place, and time. She appears well-developed and well-nourished.  Obese, no acute distress  HENT:  Head: Normocephalic and atraumatic.  Right Ear: External ear normal.  Left Ear: External ear normal.  Status post tonsillectomy, uvula midline, no significant erythema noted, postnasal drip noted, no fullness noted under the tongue  Eyes: Pupils are equal, round, and reactive to light.  Neck: Neck supple.  Bilateral symmetric cervical adenopathy  Cardiovascular: Normal rate, regular rhythm and normal heart sounds.  Pulmonary/Chest: Effort normal and breath sounds normal. No respiratory distress. She has no wheezes.  Abdominal: Soft. There is no tenderness.  Lymphadenopathy:    She has cervical adenopathy.  Neurological: She is alert and oriented to person,  place, and time.  Skin: Skin is warm and dry.  Psychiatric: She has a normal mood and affect.  Nursing note and vitals reviewed.    ED Treatments / Results  Labs (all labs ordered are listed, but only abnormal results are displayed) Labs Reviewed  RAPID STREP SCREEN (NOT AT [facility])  CULTURE, GROUP A STREP [facility])    EKG  EKG Interpretation None       Radiology No results found.  Procedures Procedures (including critical care time)  Medications Ordered in ED Medications - No data  to display   Initial Impression / Assessment and Plan / ED Course  I have reviewed the triage vital signs and the nursing notes.  Pertinent labs & imaging results that were available during my care of the patient were reviewed by me and considered in my medical decision making (see chart for details).     Patient presents with upper respiratory symptoms and sinus pressure.  Likely related to a viral illness.  She is nontoxic appearing.  Vital signs are reassuring.  Afebrile.  No suggestion on exam of deep space infection of the throat or neck.  Recommend supportive measures including continued ibuprofen or Tylenol.  Flonase and nasal saline for congestion and sinus pressure.  No indication at this time for antibiotics.  Patient was advised if symptoms persist for 10 days or more she should be reevaluated have indications for antibiotics at that time.  After history, exam, and medical workup I feel the patient has been appropriately medically screened and is safe for discharge home. Pertinent diagnoses were discussed with the patient. Patient was given return precautions.   Final Clinical Impressions(s) / ED Diagnoses   Final diagnoses:  Sinus pressure    ED Discharge Orders        Ordered    fluticasone (FLONASE) 50 MCG/ACT nasal spray  Daily     06/11/17 2317    sodium chloride (OCEAN) 0.65 % SOLN nasal spray  As needed     06/11/17 2317       Horton, Mayer Maskerourtney F, MD 06/11/17 2324

## 2017-06-11 NOTE — ED Notes (Signed)
Pt given d/c instructions as per chart. Rx x 2. Verbalizes understanding. No questions. 

## 2017-06-11 NOTE — ED Triage Notes (Signed)
PT presents with c/o neck pain she states from her sinus. No c/o sore throat.

## 2017-06-12 ENCOUNTER — Emergency Department (HOSPITAL_COMMUNITY)
Admission: EM | Admit: 2017-06-12 | Discharge: 2017-06-12 | Disposition: A | Discharge status: Home / Self Care | Payer: Medicaid Other | Attending: Emergency Medicine | Admitting: Emergency Medicine

## 2017-06-12 ENCOUNTER — Encounter (HOSPITAL_COMMUNITY): Payer: Self-pay | Admitting: Emergency Medicine

## 2017-06-12 DIAGNOSIS — B9789 Other viral agents as the cause of diseases classified elsewhere: Secondary | ICD-10-CM | POA: Insufficient documentation

## 2017-06-12 DIAGNOSIS — J069 Acute upper respiratory infection, unspecified: Secondary | ICD-10-CM | POA: Diagnosis not present

## 2017-06-12 DIAGNOSIS — R05 Cough: Secondary | ICD-10-CM | POA: Diagnosis present

## 2017-06-12 MED ORDER — BENZONATATE 100 MG PO CAPS: 100.0000 mg | ORAL_CAPSULE | Freq: Three times a day (TID) | ORAL | 0 refills | Status: DC

## 2017-06-12 NOTE — ED Triage Notes (Signed)
Per pt, states [facility]-states she is worse-states voice is hoarse and throat still hurts

## 2017-06-12 NOTE — Discharge Instructions (Signed)
Please read attached information. If you experience any new or worsening signs or symptoms please return to the emergency room for evaluation. Please follow-up with your primary care provider or specialist as discussed. Please use medication prescribed only as directed and discontinue taking if you have any concerning signs or symptoms.   °

## 2017-06-12 NOTE — ED Provider Notes (Signed)
[facility]-EMERGENCY DEPT Provider Note   CSN: [id number] Arrival date & time: 06/12/17  1030     History   Chief Complaint Chief Complaint  Patient presents with  . URI    HPI Deborah Mcbride is a 31 y.o. female.  HPI   31 year old female presents today with complaints of upper respiratory infection.  Patient notes that for the last 3 days she has had rhinorrhea, congestion, sore throat and cough.  She notes she was seen yesterday at urgent care, was diagnosed with viral upper respiratory infection and discharge.  She notes using Sudafed last night which did not improve her symptoms.  No medications prior to arrival.  Patient without any shortness of breath, fever, difficulty swallowing.  She does note a history of allergies, not currently taking any antihistamines.  History reviewed. No pertinent past medical history.  There are no active problems to display for this patient.   Past Surgical History:  Procedure Laterality Date  . CESAREAN SECTION    . CHOLECYSTECTOMY    . TONSILLECTOMY    . TUBAL LIGATION      OB History    No data available       Home Medications    Prior to Admission medications   Medication Sig Start Date End Date Taking? Authorizing Provider  benzonatate (TESSALON) 100 MG capsule Take 1 capsule (100 mg total) every 8 (eight) hours by mouth. 06/12/17   Dashawn Golda, Tinnie GensJeffrey, PA-C  cyclobenzaprine (FLEXERIL) 10 MG tablet Take 1 tablet (10 mg total) by mouth 3 (three) times daily as needed for muscle spasms. 05/15/17   Azalia Bilisampos, Kevin, MD  fluticasone (FLONASE) 50 MCG/ACT nasal spray Place 2 sprays daily into both nostrils. 06/11/17   Horton, Mayer Maskerourtney F, MD  ibuprofen (ADVIL,MOTRIN) 400 MG tablet Take 1 tablet (400 mg total) by mouth every 8 (eight) hours as needed. 05/15/17   Azalia Bilisampos, Kevin, MD  sodium chloride (OCEAN) 0.65 % SOLN nasal spray Place 1 spray as needed into both nostrils for congestion. 06/11/17   Horton, Mayer Maskerourtney F,  MD    Family History No family history on file.  Social History Social History   Tobacco Use  . Smoking status: Never Smoker  . Smokeless tobacco: Never Used  Substance Use Topics  . Alcohol use: No  . Drug use: No     Allergies   Dilaudid [hydromorphone hcl]   Review of Systems Review of Systems  All other systems reviewed and are negative.    Physical Exam Updated Vital Signs BP (!) 146/94 (BP Location: Left Arm)   Pulse 69   Temp 97.8 F (36.6 C) (Oral)   Resp 18   LMP 05/28/2017   SpO2 100%   Physical Exam  Constitutional: She is oriented to person, place, and time. She appears well-developed and well-nourished.  HENT:  Head: Normocephalic and atraumatic.  Surgically absent tonsils, no swelling or edema, no exudate, no pooling of secretions  Tender anterior cervical lymphadenopathy  Bilateral TMs normal  Eyes: Conjunctivae are normal. Pupils are equal, round, and reactive to light. Right eye exhibits no discharge. Left eye exhibits no discharge. No scleral icterus.  Neck: Normal range of motion. No JVD present. No tracheal deviation present.  Pulmonary/Chest: Effort normal and breath sounds normal. No stridor. No respiratory distress. She has no wheezes. She has no rales. She exhibits no tenderness.  Neurological: She is alert and oriented to person, place, and time. Coordination normal.  Psychiatric: She has a normal mood and  affect. Her behavior is normal. Judgment and thought content normal.  Nursing note and vitals reviewed.    ED Treatments / Results  Labs (all labs ordered are listed, but only abnormal results are displayed) Labs Reviewed - No data to display  EKG  EKG Interpretation None       Radiology No results found.  Procedures Procedures (including critical care time)  Medications Ordered in ED Medications - No data to display   Initial Impression / Assessment and Plan / ED Course  I have reviewed the triage vital signs  and the nursing notes.  Pertinent labs & imaging results that were available during my care of the patient were reviewed by me and considered in my medical decision making (see chart for details).      Final Clinical Impressions(s) / ED Diagnoses   Final diagnoses:  Viral URI with cough    31 year old female presents today with 3-day history of upper respiratory infection.  This is most likely viral in nature.  She is afebrile with no signs of systemic illness.  I find no objective findings on her exam.  Patient was seen in urgent care yesterday diagnosed with the same, rapid strep at that time..  Patient is requesting antibiotics, had a lengthy counseling sensation with her on the appropriate use of antibiotics and strict return precautions.  Patient verbalized understanding and agreement to today's plan had no further questions or concerns the time discharge.  ED Discharge Orders        Ordered    benzonatate (TESSALON) 100 MG capsule  Every 8 hours     06/12/17 1130         Eyvonne MechanicHedges, Aqsa Sensabaugh, PA-C 06/12/17 1139    Rolland PorterJames, Mark, MD 06/18/17 [phone]

## 2017-06-14 LAB — CULTURE, GROUP A STREP (THRC)

## 2017-09-10 ENCOUNTER — Emergency Department (HOSPITAL_BASED_OUTPATIENT_CLINIC_OR_DEPARTMENT_OTHER)
Admission: EM | Admit: 2017-09-10 | Discharge: 2017-09-10 | Disposition: A | Discharge status: Home / Self Care | Payer: Medicaid Other | Attending: Emergency Medicine | Admitting: Emergency Medicine

## 2017-09-10 ENCOUNTER — Encounter (HOSPITAL_BASED_OUTPATIENT_CLINIC_OR_DEPARTMENT_OTHER): Payer: Self-pay | Admitting: *Deleted

## 2017-09-10 ENCOUNTER — Emergency Department (HOSPITAL_BASED_OUTPATIENT_CLINIC_OR_DEPARTMENT_OTHER): Payer: Medicaid Other

## 2017-09-10 ENCOUNTER — Other Ambulatory Visit: Payer: Self-pay

## 2017-09-10 DIAGNOSIS — Z79899 Other long term (current) drug therapy: Secondary | ICD-10-CM | POA: Diagnosis not present

## 2017-09-10 DIAGNOSIS — J209 Acute bronchitis, unspecified: Secondary | ICD-10-CM | POA: Diagnosis not present

## 2017-09-10 DIAGNOSIS — J208 Acute bronchitis due to other specified organisms: Secondary | ICD-10-CM

## 2017-09-10 DIAGNOSIS — R030 Elevated blood-pressure reading, without diagnosis of hypertension: Secondary | ICD-10-CM | POA: Insufficient documentation

## 2017-09-10 DIAGNOSIS — R05 Cough: Secondary | ICD-10-CM | POA: Diagnosis present

## 2017-09-10 MED ORDER — AEROCHAMBER PLUS W/MASK MISC
1.0000 | Freq: Once | Status: AC
  Administered 2017-09-10: 1
  Filled 2017-09-10: qty 1

## 2017-09-10 MED ORDER — ALBUTEROL SULFATE HFA 108 (90 BASE) MCG/ACT IN AERS
2.0000 | INHALATION_SPRAY | RESPIRATORY_TRACT | Status: DC | PRN
  Administered 2017-09-10: 2 via RESPIRATORY_TRACT
  Filled 2017-09-10: qty 6.7

## 2017-09-10 NOTE — ED Triage Notes (Signed)
Pt c/o URi sytmpoms x 3 weeks

## 2017-09-10 NOTE — ED Provider Notes (Signed)
[facility] EMERGENCY DEPARTMENT Provider Note   CSN: [id number] Arrival date & time: 09/10/17  1104     History   Chief Complaint Chief Complaint  Patient presents with  . URI    HPI Deborah Mcbride is a 32 y.o. female.  HPI Complains of cough for 3 weeks.  She reports that cough is productive of green sputum until a week ago now cough is dry.  She denies any shortness of breath denies fever.  She treated herself with Robitussin without relief.  She denies sneeze or rhinorrhea.  Denies fever.  Denies other associated symptoms History reviewed. No pertinent past medical history.  There are no active problems to display for this patient.   Past Surgical History:  Procedure Laterality Date  . CESAREAN SECTION    . CHOLECYSTECTOMY    . TONSILLECTOMY    . TUBAL LIGATION      OB History    No data available       Home Medications    Prior to Admission medications   Medication Sig Start Date End Date Taking? Authorizing Provider  sodium chloride (OCEAN) 0.65 % SOLN nasal spray Place 1 spray as needed into both nostrils for congestion. 06/11/17   Horton, Mayer Maskerourtney F, MD    Family History History reviewed. No pertinent family history.  Social History Social History   Tobacco Use  . Smoking status: Never Smoker  . Smokeless tobacco: Never Used  Substance Use Topics  . Alcohol use: No  . Drug use: No     Allergies   Dilaudid [hydromorphone hcl]   Review of Systems Review of Systems  Constitutional: Negative.   HENT: Negative.   Respiratory: Positive for cough.   Cardiovascular: Negative.   Gastrointestinal: Negative.   Musculoskeletal: Negative.   Skin: Negative.   Neurological: Negative.   Psychiatric/Behavioral: Negative.   All other systems reviewed and are negative.    Physical Exam Updated Vital Signs BP (!) 151/98   Pulse 89   Temp 98.1 F (36.7 C)   Resp 20   Ht 5\' 3"  (1.6 m)   Wt (!) 144.2 kg (318 lb)   LMP 08/31/2017    SpO2 96%   BMI 56.33 kg/m   Physical Exam  Constitutional: She appears well-developed and well-nourished.  HENT:  Head: Normocephalic and atraumatic.  Eyes: Conjunctivae are normal. Pupils are equal, round, and reactive to light.  Neck: Neck supple. No tracheal deviation present. No thyromegaly present.  Cardiovascular: Normal rate and regular rhythm.  No murmur heard. Pulmonary/Chest: Effort normal and breath sounds normal.  Coughing occasionally  Abdominal: Soft. Bowel sounds are normal. She exhibits no distension. There is no tenderness.  Morbidly obese  Musculoskeletal: Normal range of motion. She exhibits no edema or tenderness.  Neurological: She is alert. Coordination normal.  Skin: Skin is warm and dry. No rash noted.  Psychiatric: She has a normal mood and affect.  Nursing note and vitals reviewed.    ED Treatments / Results  Labs (all labs ordered are listed, but only abnormal results are displayed) Labs Reviewed - No data to display  EKG  EKG Interpretation None       Radiology No results found.  Procedures Procedures (including critical care time)  Medications Ordered in ED Medications - No data to display  Chest x-ray viewed by me. Results for orders placed or performed during the hospital encounter of 06/11/17  Rapid strep screen  Result Value Ref Range   Streptococcus, Group A  Screen (Direct) NEGATIVE NEGATIVE  Culture, group A strep  Result Value Ref Range   Specimen Description THROAT    Special Requests NONE Reflexed from [id number]    Culture      NO GROUP A STREP (S.PYOGENES) ISOLATED Performed at [facility], [address], [place], [place] [address]    Report Status 06/14/2017 FINAL    Dg Chest 2 View  Result Date: 09/10/2017 CLINICAL DATA:  URI symptoms for 3 weeks EXAM: CHEST  2 VIEW COMPARISON:  06/09/2014 FINDINGS: Normal heart size and mediastinal contours. No acute infiltrate or edema. No effusion or pneumothorax. No acute  osseous findings. IMPRESSION: Negative chest. Electronically Signed   By: Marnee Spring M.D.   On: 09/10/2017 13:44   Initial Impression / Assessment and Plan / ED Course  I have reviewed the triage vital signs and the nursing notes.  Pertinent labs & imaging results that were available during my care of the patient were reviewed by me and considered in my medical decision making (see chart for details).     Plan she will receive an albuterol HFA with spacer to go to use 2 puffs every 4 hours as needed for cough or shortness of breath.  She is to keep her scheduled appointment with her PCP on September 14, 2017.Marland Kitchen  Blood pressure recheck  Final Clinical Impressions(s) / ED Diagnoses  dx #1 acute bronchitis #2 elevated blood pressure Final diagnoses:  None    ED Discharge Orders    None       Doug Sou, MD 09/10/17 [phone]

## 2017-09-10 NOTE — Discharge Instructions (Signed)
Use your inhaler with spacer 2 puffs every 4 hours as needed for cough or shortness of breath.  Keep your scheduled appointment with your primary care physician on September 14, 2017.  Ask your doctor to repeat your blood pressure.  Today's was mildly elevated at 136/86

## 2017-10-01 ENCOUNTER — Emergency Department (HOSPITAL_COMMUNITY)
Admission: EM | Admit: 2017-10-01 | Discharge: 2017-10-02 | Disposition: A | Discharge status: Home / Self Care | Payer: Medicaid Other | Attending: Emergency Medicine | Admitting: Emergency Medicine

## 2017-10-01 ENCOUNTER — Encounter (HOSPITAL_COMMUNITY): Payer: Self-pay | Admitting: Emergency Medicine

## 2017-10-01 ENCOUNTER — Other Ambulatory Visit: Payer: Self-pay

## 2017-10-01 DIAGNOSIS — R109 Unspecified abdominal pain: Secondary | ICD-10-CM | POA: Diagnosis present

## 2017-10-01 DIAGNOSIS — Z885 Allergy status to narcotic agent status: Secondary | ICD-10-CM | POA: Diagnosis not present

## 2017-10-01 NOTE — ED Notes (Signed)
Bed: [id number] Expected date:  Expected time:  Means of arrival:  Comments: 31yo F/ Flank pain

## 2017-10-01 NOTE — ED Triage Notes (Signed)
Pt brought inj by EMS from home with c/o right flank pain, no N/V/D.  Pt denies hx of kidney stones.

## 2017-10-02 ENCOUNTER — Emergency Department (HOSPITAL_COMMUNITY): Payer: Medicaid Other

## 2017-10-02 LAB — URINALYSIS, ROUTINE W REFLEX MICROSCOPIC
Bilirubin Urine: NEGATIVE
GLUCOSE, UA: NEGATIVE mg/dL
HGB URINE DIPSTICK: NEGATIVE
Ketones, ur: NEGATIVE mg/dL
LEUKOCYTES UA: NEGATIVE
Nitrite: NEGATIVE
Protein, ur: NEGATIVE mg/dL
SPECIFIC GRAVITY, URINE: 1.019 (ref 1.005–1.030)
pH: 7 (ref 5.0–8.0)

## 2017-10-02 LAB — LIPASE, BLOOD: Lipase: 18 U/L (ref 11–51)

## 2017-10-02 LAB — CBC WITH DIFFERENTIAL/PLATELET
BASOS ABS: 0.1 10*3/uL (ref 0.0–0.1)
BASOS PCT: 1 %
EOS ABS: 0.2 10*3/uL (ref 0.0–0.7)
EOS PCT: 2 %
HCT: 39.7 % (ref 36.0–46.0)
Hemoglobin: 13 g/dL (ref 12.0–15.0)
Lymphocytes Relative: 27 %
Lymphs Abs: 3.5 10*3/uL (ref 0.7–4.0)
MCH: 27.3 pg (ref 26.0–34.0)
MCHC: 32.7 g/dL (ref 30.0–36.0)
MCV: 83.2 fL (ref 78.0–100.0)
MONO ABS: 0.8 10*3/uL (ref 0.1–1.0)
Monocytes Relative: 6 %
Neutro Abs: 8.5 10*3/uL — ABNORMAL HIGH (ref 1.7–7.7)
Neutrophils Relative %: 64 %
PLATELETS: 440 10*3/uL — AB (ref 150–400)
RBC: 4.77 MIL/uL (ref 3.87–5.11)
RDW: 15 % (ref 11.5–15.5)
WBC: 13.1 10*3/uL — AB (ref 4.0–10.5)

## 2017-10-02 LAB — COMPREHENSIVE METABOLIC PANEL
ALBUMIN: 3.7 g/dL (ref 3.5–5.0)
ALT: 15 U/L (ref 14–54)
AST: 18 U/L (ref 15–41)
Alkaline Phosphatase: 71 U/L (ref 38–126)
Anion gap: 9 (ref 5–15)
BUN: 11 mg/dL (ref 6–20)
CHLORIDE: 106 mmol/L (ref 101–111)
CO2: 24 mmol/L (ref 22–32)
CREATININE: 0.8 mg/dL (ref 0.44–1.00)
Calcium: 9.2 mg/dL (ref 8.9–10.3)
GFR calc Af Amer: >60 mL/min (ref >60)
GFR calc non Af Amer: >60 mL/min (ref >60)
GLUCOSE: 96 mg/dL (ref 65–99)
POTASSIUM: 3.8 mmol/L (ref 3.5–5.1)
SODIUM: 139 mmol/L (ref 135–145)
Total Bilirubin: 0.3 mg/dL (ref 0.3–1.2)
Total Protein: 6.7 g/dL (ref 6.5–8.1)

## 2017-10-02 LAB — I-STAT BETA HCG BLOOD, ED (MC, WL, AP ONLY): I-stat hCG, quantitative: <5 m[IU]/mL (ref <5)

## 2017-10-02 MED ORDER — KETOROLAC TROMETHAMINE 30 MG/ML IJ SOLN
30.0000 mg | Freq: Once | INTRAMUSCULAR | Status: AC
  Administered 2017-10-02: 30 mg via INTRAVENOUS
  Filled 2017-10-02: qty 1

## 2017-10-02 MED ORDER — TRAMADOL HCL 50 MG PO TABS: 50.0000 mg | ORAL_TABLET | Freq: Four times a day (QID) | ORAL | 0 refills | Status: DC | PRN

## 2017-10-02 MED ORDER — DICYCLOMINE HCL 20 MG PO TABS: 20.0000 mg | ORAL_TABLET | Freq: Three times a day (TID) | ORAL | 0 refills | Status: DC

## 2017-10-02 NOTE — ED Provider Notes (Signed)
[facility]-EMERGENCY DEPT Provider Note   CSN: [id number] Arrival date & time: 10/01/17  2341     History   Chief Complaint Chief Complaint  Patient presents with  . Flank Pain    HPI Deborah Mcbride is a 32 y.o. female.  Patient presents to the emergency department for evaluation of right flank pain.  Patient reports that symptoms have been present since early this morning.  She reports continuous pain.  Pain worsens with movements.  She has not noticed any urinary symptoms.  Pain goes from the right lower back around her side to the groin area.  No associated fever, nausea, vomiting, diarrhea or constipation.  She has never had a kidney stone.      History reviewed. No pertinent past medical history.  There are no active problems to display for this patient.   Past Surgical History:  Procedure Laterality Date  . CESAREAN SECTION    . CHOLECYSTECTOMY    . TONSILLECTOMY    . TUBAL LIGATION      OB History    No data available       Home Medications    Prior to Admission medications   Medication Sig Start Date End Date Taking? Authorizing Provider  dicyclomine (BENTYL) 20 MG tablet Take 1 tablet (20 mg total) by mouth 3 (three) times daily before meals. 10/02/17   Gilda Crease, MD  sodium chloride (OCEAN) 0.65 % SOLN nasal spray Place 1 spray as needed into both nostrils for congestion. 06/11/17   Horton, Mayer Masker, MD  traMADol (ULTRAM) 50 MG tablet Take 1 tablet (50 mg total) by mouth every 6 (six) hours as needed. 10/02/17   Gilda Crease, MD    Family History History reviewed. No pertinent family history.  Social History Social History   Tobacco Use  . Smoking status: Never Smoker  . Smokeless tobacco: Never Used  Substance Use Topics  . Alcohol use: No  . Drug use: No     Allergies   Dilaudid [hydromorphone hcl]   Review of Systems Review of Systems  Gastrointestinal: Positive for abdominal pain.    Genitourinary: Positive for flank pain.  Musculoskeletal: Positive for back pain.  All other systems reviewed and are negative.    Physical Exam Updated Vital Signs BP 140/76   Pulse 79   Temp 98.4 F (36.9 C) (Oral)   Resp 18   Ht 5\' 3"  (1.6 m)   Wt (!) 144.2 kg (318 lb)   SpO2 97%   BMI 56.33 kg/m   Physical Exam  Constitutional: She is oriented to person, place, and time. She appears well-developed and well-nourished. No distress.  HENT:  Head: Normocephalic and atraumatic.  Right Ear: Hearing normal.  Left Ear: Hearing normal.  Nose: Nose normal.  Mouth/Throat: Oropharynx is clear and moist and mucous membranes are normal.  Eyes: Conjunctivae and EOM are normal. Pupils are equal, round, and reactive to light.  Neck: Normal range of motion. Neck supple.  Cardiovascular: Regular rhythm, S1 normal and S2 normal. Exam reveals no gallop and no friction rub.  No murmur heard. Pulmonary/Chest: Effort normal and breath sounds normal. No respiratory distress. She exhibits no tenderness.  Abdominal: Soft. Normal appearance and bowel sounds are normal. There is no hepatosplenomegaly. There is no tenderness. There is no rebound, no guarding, no tenderness at McBurney's point and negative Murphy's sign. No hernia.  Diffuse right-sided tenderness without guarding, rebound or focal tenderness  Musculoskeletal: Normal range of motion.  Neurological: She is alert and oriented to person, place, and time. She has normal strength. No cranial nerve deficit or sensory deficit. Coordination normal. GCS eye subscore is 4. GCS verbal subscore is 5. GCS motor subscore is 6.  Skin: Skin is warm, dry and intact. No rash noted. No cyanosis.  Psychiatric: She has a normal mood and affect. Her speech is normal and behavior is normal. Thought content normal.  Nursing note and vitals reviewed.    ED Treatments / Results  Labs (all labs ordered are listed, but only abnormal results are  displayed) Labs Reviewed  CBC WITH DIFFERENTIAL/PLATELET - Abnormal; Notable for the following components:      Result Value   WBC 13.1 (*)    Platelets 440 (*)    Neutro Abs 8.5 (*)    All other components within normal limits  COMPREHENSIVE METABOLIC PANEL  LIPASE, BLOOD  URINALYSIS, ROUTINE W REFLEX MICROSCOPIC  I-STAT BETA HCG BLOOD, ED ([facility], [facility], [facility] ONLY)    EKG  EKG Interpretation None       Radiology Ct Renal Stone Study  Result Date: 10/02/2017 CLINICAL DATA:  Right-sided flank pain EXAM: CT ABDOMEN AND PELVIS WITHOUT CONTRAST TECHNIQUE: Multidetector CT imaging of the abdomen and pelvis was performed following the standard protocol without IV contrast. COMPARISON:  CT 05/16/2016 FINDINGS: Lower chest: No acute abnormality. Hepatobiliary: Hepatic steatosis. Surgical clips in the gallbladder fossa. No biliary dilatation Pancreas: Unremarkable. No pancreatic ductal dilatation or surrounding inflammatory changes. Spleen: Normal in size without focal abnormality. Adrenals/Urinary Tract: Adrenal glands are unremarkable. Kidneys are normal, without renal calculi, focal lesion, or hydronephrosis. Bladder is unremarkable. Stomach/Bowel: Stomach is within normal limits. Appendix appears normal. No evidence of bowel wall thickening, distention, or inflammatory changes. Vascular/Lymphatic: No significant vascular findings are present. No enlarged abdominal or pelvic lymph nodes. Reproductive: Uterus and bilateral adnexa are unremarkable. Other: Negative for free air or free fluid. Fat in the umbilical region Musculoskeletal: No acute or significant osseous findings. IMPRESSION: 1. Negative for nephrolithiasis, hydronephrosis or ureteral stone 2. No CT evidence for acute intra-abdominal or pelvic abnormality 3. Hepatic steatosis Electronically Signed   By: Jasmine PangKim  Fujinaga M.D.   On: 10/02/2017 01:11    Procedures Procedures (including critical care time)  Medications Ordered in ED Medications   ketorolac (TORADOL) 30 MG/ML injection 30 mg (not administered)     Initial Impression / Assessment and Plan / ED Course  I have reviewed the triage vital signs and the nursing notes.  Pertinent labs & imaging results that were available during my care of the patient were reviewed by me and considered in my medical decision making (see chart for details).     Patient presented to the emergency department for evaluation of right-sided pain.  Pain is in the right back, side and abdomen region.  She does not have a gallbladder.  Patient not having any pelvic complaints.  She did have a slightly elevated white blood cell counts, otherwise lab work was unremarkable.  Patient underwent CT of abdomen and pelvis to evaluate for ureterolithiasis, appendicitis, or other pathology.  No acute pathology is noted, this was a normal exam including normal appendix and adnexa.  Etiology of pain is unclear, but does not require any further workup.  Treat with analgesia and rest.  Final Clinical Impressions(s) / ED Diagnoses   Final diagnoses:  Abdominal pain, unspecified abdominal location  Flank pain    ED Discharge Orders        Ordered  dicyclomine (BENTYL) 20 MG tablet  3 times daily before meals     10/02/17 0146    traMADol (ULTRAM) 50 MG tablet  Every 6 hours PRN     10/02/17 0146       Gilda Crease, MD 10/02/17 [phone]

## 2017-11-01 ENCOUNTER — Encounter (HOSPITAL_COMMUNITY): Payer: Self-pay

## 2017-11-01 ENCOUNTER — Emergency Department (HOSPITAL_COMMUNITY)
Admission: EM | Admit: 2017-11-01 | Discharge: 2017-11-01 | Disposition: A | Discharge status: Home / Self Care | Payer: Medicaid Other | Attending: Emergency Medicine | Admitting: Emergency Medicine

## 2017-11-01 ENCOUNTER — Emergency Department (HOSPITAL_COMMUNITY): Payer: Medicaid Other

## 2017-11-01 ENCOUNTER — Other Ambulatory Visit: Payer: Self-pay

## 2017-11-01 DIAGNOSIS — D72828 Other elevated white blood cell count: Secondary | ICD-10-CM | POA: Diagnosis not present

## 2017-11-01 DIAGNOSIS — R197 Diarrhea, unspecified: Secondary | ICD-10-CM

## 2017-11-01 DIAGNOSIS — R10819 Abdominal tenderness, unspecified site: Secondary | ICD-10-CM | POA: Diagnosis present

## 2017-11-01 LAB — COMPREHENSIVE METABOLIC PANEL
ALK PHOS: 86 U/L (ref 38–126)
ALT: 14 U/L (ref 14–54)
AST: 17 U/L (ref 15–41)
Albumin: 4 g/dL (ref 3.5–5.0)
Anion gap: 9 (ref 5–15)
BILIRUBIN TOTAL: 0.6 mg/dL (ref 0.3–1.2)
BUN: 12 mg/dL (ref 6–20)
CALCIUM: 9 mg/dL (ref 8.9–10.3)
CO2: 23 mmol/L (ref 22–32)
CREATININE: 0.83 mg/dL (ref 0.44–1.00)
Chloride: 107 mmol/L (ref 101–111)
Glucose, Bld: 78 mg/dL (ref 65–99)
Potassium: 3.9 mmol/L (ref 3.5–5.1)
Sodium: 139 mmol/L (ref 135–145)
TOTAL PROTEIN: 7.9 g/dL (ref 6.5–8.1)

## 2017-11-01 LAB — CBC WITH DIFFERENTIAL/PLATELET
Band Neutrophils: 0 %
Basophils Absolute: 0 10*3/uL (ref 0.0–0.1)
Basophils Relative: 0 %
Blasts: 0 %
EOS PCT: 2 %
Eosinophils Absolute: 0.3 10*3/uL (ref 0.0–0.7)
HCT: 43.1 % (ref 36.0–46.0)
HEMOGLOBIN: 14 g/dL (ref 12.0–15.0)
LYMPHS ABS: 3 10*3/uL (ref 0.7–4.0)
Lymphocytes Relative: 20 %
MCH: 26.8 pg (ref 26.0–34.0)
MCHC: 32.5 g/dL (ref 30.0–36.0)
MCV: 82.4 fL (ref 78.0–100.0)
MONOS PCT: 5 %
MYELOCYTES: 0 %
Metamyelocytes Relative: 0 %
Monocytes Absolute: 0.7 10*3/uL (ref 0.1–1.0)
NEUTROS PCT: 73 %
Neutro Abs: 10.9 10*3/uL — ABNORMAL HIGH (ref 1.7–7.7)
Other: 0 %
PLATELETS: 468 10*3/uL — AB (ref 150–400)
Promyelocytes Absolute: 0 %
RBC: 5.23 MIL/uL — AB (ref 3.87–5.11)
RDW: 14.7 % (ref 11.5–15.5)
WBC: 14.9 10*3/uL — AB (ref 4.0–10.5)
nRBC: 0 /100 WBC

## 2017-11-01 LAB — URINALYSIS, ROUTINE W REFLEX MICROSCOPIC
BILIRUBIN URINE: NEGATIVE
GLUCOSE, UA: NEGATIVE mg/dL
Hgb urine dipstick: NEGATIVE
KETONES UR: NEGATIVE mg/dL
Leukocytes, UA: NEGATIVE
Nitrite: NEGATIVE
PH: 5 (ref 5.0–8.0)
Protein, ur: NEGATIVE mg/dL

## 2017-11-01 LAB — I-STAT BETA HCG BLOOD, ED (MC, WL, AP ONLY)

## 2017-11-01 LAB — LIPASE, BLOOD: LIPASE: 18 U/L (ref 11–51)

## 2017-11-01 MED ORDER — SODIUM CHLORIDE 0.9 % IV BOLUS
1000.0000 mL | Freq: Once | INTRAVENOUS | Status: AC
  Administered 2017-11-01: 1000 mL via INTRAVENOUS

## 2017-11-01 MED ORDER — IOPAMIDOL (ISOVUE-300) INJECTION 61%
INTRAVENOUS | Status: AC
  Filled 2017-11-01: qty 100

## 2017-11-01 MED ORDER — ONDANSETRON HCL 4 MG PO TABS: 4.0000 mg | ORAL_TABLET | Freq: Four times a day (QID) | ORAL | 0 refills | Status: DC

## 2017-11-01 MED ORDER — SODIUM CHLORIDE 0.9 % IV SOLN
Freq: Once | INTRAVENOUS | Status: AC
  Administered 2017-11-01: 14:00:00 via INTRAVENOUS

## 2017-11-01 MED ORDER — IOPAMIDOL (ISOVUE-300) INJECTION 61%
100.0000 mL | Freq: Once | INTRAVENOUS | Status: AC | PRN
  Administered 2017-11-01: 100 mL via INTRAVENOUS

## 2017-11-01 NOTE — ED Triage Notes (Signed)
N/V/D, fatigue, decreased appetite. May have "passed out" today. Not orthostatic. EMS orthostats: Laying: 126/68 Sitting: 130/70 Standing134/86

## 2017-11-01 NOTE — ED Notes (Signed)
Pt aware UA needed. Unable to void at this time.

## 2017-11-01 NOTE — ED Provider Notes (Signed)
Took over patient care from Dr. Erma HeritageIsaacs at 1700.  Patient's UA returned without signs of infection.  On reevaluation patient is stable and well appearing.  Recommended follow-up with PCP for possible stool cultures if symptoms worsen as she has had no diarrhea here.  Also recommended following up with GI if her symptoms do not improve.  Patient was given Zofran.   Deborah Mcbride, Deborah Bains, MD 11/01/17 1750

## 2017-11-01 NOTE — ED Notes (Signed)
Unsuccessful IV start x2.  

## 2017-11-01 NOTE — ED Notes (Signed)
Bed: WA17 Expected date:  Expected time:  Means of arrival:  Comments: EMS n/v/d 

## 2017-11-01 NOTE — ED Provider Notes (Signed)
[facility]-EMERGENCY DEPT Provider Note   CSN: [id number] Arrival date & time: 11/01/17  1245     History   Chief Complaint Chief Complaint  Patient presents with  . Weakness    HPI Deborah Mcbride is a 32 y.o. female.  HPI   32 yo F here with n/v/d. Pt states that for 2 weeks she's had progressively worsening nausea and poor appetite. Over the past 48 hours, she's begun to have multiple loose, watery stools. She's had associated fatigue, and lightheadedness with possible syncope today. She was on the phone with her husband while lying in bed then reportedly passed out versus went to sleep. She denies any CP or SOB. Awoke to her husband coming into the house to check on her. Denies any pain, just nausea and diarrhea. She's not been able to eat/drink much. No vaginal bleeding or discharge. NO urinary sx. No alleviating factors and has not taken any meds. No aggravating factors. NO known fevers.  History reviewed. No pertinent past medical history.  There are no active problems to display for this patient.   Past Surgical History:  Procedure Laterality Date  . CESAREAN SECTION    . CHOLECYSTECTOMY    . TONSILLECTOMY    . TUBAL LIGATION       OB History   None      Home Medications    Prior to Admission medications   Medication Sig Start Date End Date Taking? Authorizing Provider  ibuprofen (ADVIL,MOTRIN) 200 MG tablet Take 400-600 mg by mouth every 6 (six) hours as needed for moderate pain.   Yes [provider]  dicyclomine (BENTYL) 20 MG tablet Take 1 tablet (20 mg total) by mouth 3 (three) times daily before meals. Patient not taking: Reported on 11/01/2017 10/02/17   Gilda Crease, MD  traMADol (ULTRAM) 50 MG tablet Take 1 tablet (50 mg total) by mouth every 6 (six) hours as needed. Patient not taking: Reported on 11/01/2017 10/02/17   Gilda Crease, MD    Family History No family history on file.  Social  History Social History   Tobacco Use  . Smoking status: Never Smoker  . Smokeless tobacco: Never Used  Substance Use Topics  . Alcohol use: No  . Drug use: No     Allergies   Dilaudid [hydromorphone hcl]   Review of Systems Review of Systems  Constitutional: Positive for chills and fatigue. Negative for fever.  HENT: Negative for congestion, rhinorrhea and sore throat.   Eyes: Negative for visual disturbance.  Respiratory: Negative for cough, shortness of breath and wheezing.   Cardiovascular: Negative for chest pain and leg swelling.  Gastrointestinal: Positive for diarrhea and nausea. Negative for abdominal pain and vomiting.  Genitourinary: Negative for dysuria, flank pain, vaginal bleeding and vaginal discharge.  Musculoskeletal: Negative for neck pain and neck stiffness.  Skin: Negative for rash and wound.  Allergic/Immunologic: Negative for immunocompromised state.  Neurological: Positive for syncope and weakness. Negative for headaches.  Hematological: Does not bruise/bleed easily.  All other systems reviewed and are negative.    Physical Exam Updated Vital Signs BP 130/76   Pulse (!) 119   Temp 98.6 F (37 C) (Oral)   Resp (!) 21   Ht 5\' 3"  (1.6 m)   Wt (!) 144.2 kg (318 lb)   LMP 10/18/2017   SpO2 95%   BMI 56.33 kg/m   Physical Exam  Constitutional: She is oriented to person, place, and time. She appears well-developed  and well-nourished. No distress.  HENT:  Head: Normocephalic and atraumatic.  Dry MM  Eyes: Conjunctivae are normal.  Neck: Neck supple.  Cardiovascular: Normal rate, regular rhythm and normal heart sounds. Exam reveals no friction rub.  No murmur heard. Pulmonary/Chest: Effort normal and breath sounds normal. No respiratory distress. She has no wheezes. She has no rales.  Abdominal: Soft. Bowel sounds are normal. She exhibits no distension. There is tenderness (mild, diffuse). There is no rebound and no guarding.  Musculoskeletal:  She exhibits no edema.  Neurological: She is alert and oriented to person, place, and time. She exhibits normal muscle tone.  Skin: Skin is warm. Capillary refill takes less than 2 seconds.  Psychiatric: She has a normal mood and affect.  Nursing note and vitals reviewed.    ED Treatments / Results  Labs (all labs ordered are listed, but only abnormal results are displayed) Labs Reviewed  CBC WITH DIFFERENTIAL/PLATELET - Abnormal; Notable for the following components:      Result Value   WBC 14.9 (*)    RBC 5.23 (*)    Platelets 468 (*)    Neutro Abs 10.9 (*)    All other components within normal limits  COMPREHENSIVE METABOLIC PANEL  LIPASE, BLOOD  URINALYSIS, ROUTINE W REFLEX MICROSCOPIC  I-STAT BETA HCG BLOOD, ED ([facility], [facility], [facility] ONLY)    EKG EKG Interpretation  Date/Time:  Wednesday November 01 2017 14:02:19 EDT Ventricular Rate:  88 PR Interval:    QRS Duration: 85 QT Interval:  353 QTC Calculation: 428 R Axis:   81 Text Interpretation:  Sinus rhythm No old tracing to compare Confirmed by Shaune Pollack [phone]) on 11/01/2017 3:38:40 PM   Radiology Ct Abdomen Pelvis W Contrast  Result Date: 11/01/2017 CLINICAL DATA:  Nausea, vomiting, diarrhea, fatigue, syncope, abdominal distension EXAM: CT ABDOMEN AND PELVIS WITH CONTRAST TECHNIQUE: Multidetector CT imaging of the abdomen and pelvis was performed using the standard protocol following bolus administration of intravenous contrast. Sagittal and coronal MPR images reconstructed from axial data set. CONTRAST:  ISOVUE-300 IOPAMIDOL (ISOVUE-300) INJECTION 61% IV IV. No oral contrast. COMPARISON:  None FINDINGS: Lower chest: Lung bases clear Hepatobiliary: Gallbladder surgically absent. Minimal fatty infiltration of liver. No focal hepatic lesions. Pancreas: Normal appearance Spleen: Normal appearance Adrenals/Urinary Tract: Adrenal glands, kidneys, ureters, and bladder normal appearance Stomach/Bowel: Normal appendix. Stomach and  bowel loops normal appearance Vascular/Lymphatic: Vascular structures unremarkable. 12 mm short axis mesenteric lymph node medial to the ascending colon image 52 previously 11 mm. No additional adenopathy. Reproductive: Unremarkable uterus and adnexa Other: No free air or free fluid. No hernia or acute inflammatory process. Musculoskeletal: BILATERAL spondylolysis L5 without spondylolisthesis. No other osseous abnormalities. IMPRESSION: Minimal fatty infiltration of liver. No acute intra-abdominal or intrapelvic abnormalities. 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 Electronically Signed   By: Ulyses Southward M.D.   On: 11/01/2017 16:04    Procedures Procedures (including critical care time)  Medications Ordered in ED Medications  iopamidol (ISOVUE-300) 61 % injection (has no administration in time range)  0.9 %  sodium chloride infusion ( Intravenous New Bag/Given 11/01/17 1417)  sodium chloride 0.9 % bolus 1,000 mL (1,000 mLs Intravenous New Bag/Given 11/01/17 1417)  sodium chloride 0.9 % bolus 1,000 mL (1,000 mLs Intravenous New Bag/Given 11/01/17 1417)  iopamidol (ISOVUE-300) 61 % injection 100 mL (100 mLs Intravenous Contrast Given 11/01/17 1515)     Initial Impression / Assessment and Plan / ED Course  I have reviewed the triage vital signs and the  nursing notes.  Pertinent labs & imaging results that were available during my care of the patient were reviewed by me and considered in my medical decision making (see chart for details).  Clinical Course as of Nov 01 1609  Wed Nov 01, 2017  1358 32 yo F here with gen weakness, nausea and diarrhea. Dehydrated clinically. Suspect viral GI illness, diverticulitis, colitis, less likely appendicitis. Labs, CT ordered. UPT neg. Pt is o/w HDS and well appearing. ? Of syncope today - no CP, no signs of PE, dissection, or ACS. EKG normal. F/u labs , likely 2/2 her profound dehydration.   [CI]  1543 Lab work reassuring thus far. UA needs to be  collected. CT pending. Pt does have moderate leukocytosis but this is apparently chronic, slightly increased.   [CI]  1609 CT shows no acute abnormality. Still awaiting UA. Continue tx. Plan to f/u UA, symptom control. Likely viral GI illness versus atypical UTI.   [CI]    Clinical Course User Index [CI] Shaune PollackIsaacs, Denisse Whitenack, MD    Patient care transferred to Dr. Anitra LauthPlunkett at the end of my shift. Patient presentation, ED course, and plan of care discussed with review of all pertinent labs and imaging. Please see his/her note for further details regarding further ED course and disposition.   Final Clinical Impressions(s) / ED Diagnoses   Final diagnoses:  Other elevated white blood cell (WBC) count  Diarrhea of presumed infectious origin    ED Discharge Orders    None       Shaune PollackIsaacs, Zeplin Aleshire, MD 11/01/17 1611

## 2017-11-01 NOTE — ED Notes (Signed)
Pt states she is still unable to void 

## 2017-11-06 ENCOUNTER — Other Ambulatory Visit: Payer: Self-pay

## 2017-11-06 DIAGNOSIS — R5381 Other malaise: Secondary | ICD-10-CM

## 2017-11-09 ENCOUNTER — Other Ambulatory Visit: Payer: Self-pay | Admitting: Obstetrics and Gynecology

## 2017-11-09 DIAGNOSIS — N6310 Unspecified lump in the right breast, unspecified quadrant: Secondary | ICD-10-CM

## 2017-11-09 DIAGNOSIS — N6320 Unspecified lump in the left breast, unspecified quadrant: Secondary | ICD-10-CM

## 2017-11-15 ENCOUNTER — Ambulatory Visit
Admission: RE | Admit: 2017-11-15 | Discharge: 2017-11-15 | Disposition: A | Discharge status: Home / Self Care | Payer: Medicaid Other | Source: Ambulatory Visit | Attending: Obstetrics and Gynecology | Admitting: Obstetrics and Gynecology

## 2017-11-15 ENCOUNTER — Ambulatory Visit: Payer: Medicaid Other

## 2017-11-15 DIAGNOSIS — N6320 Unspecified lump in the left breast, unspecified quadrant: Secondary | ICD-10-CM

## 2017-11-15 DIAGNOSIS — N6310 Unspecified lump in the right breast, unspecified quadrant: Secondary | ICD-10-CM

## 2017-12-27 ENCOUNTER — Encounter (HOSPITAL_COMMUNITY): Payer: Self-pay

## 2017-12-27 DIAGNOSIS — J069 Acute upper respiratory infection, unspecified: Secondary | ICD-10-CM | POA: Diagnosis not present

## 2017-12-27 DIAGNOSIS — R0602 Shortness of breath: Secondary | ICD-10-CM | POA: Diagnosis present

## 2017-12-27 NOTE — ED Triage Notes (Signed)
Pt complains of being short of breath when laying down and it persisted over three hours

## 2017-12-28 ENCOUNTER — Emergency Department (HOSPITAL_COMMUNITY)
Admission: EM | Admit: 2017-12-28 | Discharge: 2017-12-28 | Disposition: A | Discharge status: Home / Self Care | Payer: Medicaid Other | Attending: Emergency Medicine | Admitting: Emergency Medicine

## 2017-12-28 ENCOUNTER — Emergency Department (HOSPITAL_COMMUNITY): Payer: Medicaid Other

## 2017-12-28 DIAGNOSIS — J069 Acute upper respiratory infection, unspecified: Secondary | ICD-10-CM

## 2017-12-28 LAB — CBC WITH DIFFERENTIAL/PLATELET
BASOS ABS: 0 10*3/uL (ref 0.0–0.1)
Basophils Relative: 0 %
EOS PCT: 1 %
Eosinophils Absolute: 0.1 10*3/uL (ref 0.0–0.7)
HCT: 43.4 % (ref 36.0–46.0)
Hemoglobin: 14.2 g/dL (ref 12.0–15.0)
LYMPHS PCT: 18 %
Lymphs Abs: 3 10*3/uL (ref 0.7–4.0)
MCH: 26.3 pg (ref 26.0–34.0)
MCHC: 32.7 g/dL (ref 30.0–36.0)
MCV: 80.5 fL (ref 78.0–100.0)
MONO ABS: 0.9 10*3/uL (ref 0.1–1.0)
Monocytes Relative: 5 %
Neutro Abs: 13 10*3/uL — ABNORMAL HIGH (ref 1.7–7.7)
Neutrophils Relative %: 76 %
PLATELETS: 485 10*3/uL — AB (ref 150–400)
RBC: 5.39 MIL/uL — ABNORMAL HIGH (ref 3.87–5.11)
RDW: 14.6 % (ref 11.5–15.5)
WBC: 17.1 10*3/uL — ABNORMAL HIGH (ref 4.0–10.5)

## 2017-12-28 LAB — COMPREHENSIVE METABOLIC PANEL
ALT: 15 U/L (ref 14–54)
AST: 16 U/L (ref 15–41)
Albumin: 4.3 g/dL (ref 3.5–5.0)
Alkaline Phosphatase: 90 U/L (ref 38–126)
Anion gap: 10 (ref 5–15)
BUN: 10 mg/dL (ref 6–20)
CHLORIDE: 102 mmol/L (ref 101–111)
CO2: 25 mmol/L (ref 22–32)
Calcium: 9.6 mg/dL (ref 8.9–10.3)
Creatinine, Ser: 0.85 mg/dL (ref 0.44–1.00)
Glucose, Bld: 96 mg/dL (ref 65–99)
POTASSIUM: 3.8 mmol/L (ref 3.5–5.1)
Sodium: 137 mmol/L (ref 135–145)
Total Bilirubin: 0.6 mg/dL (ref 0.3–1.2)
Total Protein: 8.1 g/dL (ref 6.5–8.1)

## 2017-12-28 LAB — URINALYSIS, ROUTINE W REFLEX MICROSCOPIC
Bilirubin Urine: NEGATIVE
Glucose, UA: NEGATIVE mg/dL
Hgb urine dipstick: NEGATIVE
Ketones, ur: NEGATIVE mg/dL
LEUKOCYTES UA: NEGATIVE
NITRITE: NEGATIVE
PROTEIN: NEGATIVE mg/dL
Specific Gravity, Urine: 1.012 (ref 1.005–1.030)
pH: 7 (ref 5.0–8.0)

## 2017-12-28 LAB — I-STAT CG4 LACTIC ACID, ED: LACTIC ACID, VENOUS: 1.19 mmol/L (ref 0.5–1.9)

## 2017-12-28 MED ORDER — LEVOFLOXACIN 750 MG PO TABS: 750.0000 mg | ORAL_TABLET | Freq: Every day | ORAL | 0 refills | Status: DC

## 2017-12-28 MED ORDER — LEVOFLOXACIN 750 MG PO TABS
750.0000 mg | ORAL_TABLET | Freq: Once | ORAL | Status: AC
Start: 2017-12-28 — End: 2017-12-28
  Administered 2017-12-28: 750 mg via ORAL
  Filled 2017-12-28: qty 1

## 2017-12-28 MED ORDER — SODIUM CHLORIDE 0.9 % IV BOLUS
1000.0000 mL | Freq: Once | INTRAVENOUS | Status: AC
  Administered 2017-12-28: 1000 mL via INTRAVENOUS

## 2017-12-28 MED ORDER — ACETAMINOPHEN 500 MG PO TABS
1000.0000 mg | ORAL_TABLET | Freq: Once | ORAL | Status: AC
  Administered 2017-12-28: 1000 mg via ORAL
  Filled 2017-12-28: qty 2

## 2017-12-28 NOTE — ED Provider Notes (Signed)
[facility]-EMERGENCY DEPT Provider Note   CSN: [id number] Arrival date & time: 12/27/17  2235     History   Chief Complaint Chief Complaint  Patient presents with  . Shortness of Breath    HPI Deborah Mcbride is a 32 y.o. female.  Patient presents to the ER for evaluation of heart palpitations, weakness, shortness of breath.  Symptoms began earlier this evening.  She has had cough and chest congestion.  Patient was unaware of her fever that was identified at arrival.  She has not had any nausea, vomiting, diarrhea, abdominal pain, flank pain, urinary symptoms.     History reviewed. No pertinent past medical history.  There are no active problems to display for this patient.   Past Surgical History:  Procedure Laterality Date  . CESAREAN SECTION    . CHOLECYSTECTOMY    . TONSILLECTOMY    . TUBAL LIGATION       OB History   None      Home Medications    Prior to Admission medications   Medication Sig Start Date End Date Taking? Authorizing Provider  ibuprofen (ADVIL,MOTRIN) 200 MG tablet Take 400-600 mg by mouth every 6 (six) hours as needed for moderate pain.   Yes [provider]  dicyclomine (BENTYL) 20 MG tablet Take 1 tablet (20 mg total) by mouth 3 (three) times daily before meals. Patient not taking: Reported on 11/01/2017 10/02/17   Gilda Crease, MD  levofloxacin (LEVAQUIN) 750 MG tablet Take 1 tablet (750 mg total) by mouth daily. 12/28/17   Gilda Crease, MD  ondansetron (ZOFRAN) 4 MG tablet Take 1 tablet (4 mg total) by mouth every 6 (six) hours. Patient not taking: Reported on 12/28/2017 11/01/17   Gwyneth Sprout, MD  traMADol (ULTRAM) 50 MG tablet Take 1 tablet (50 mg total) by mouth every 6 (six) hours as needed. Patient not taking: Reported on 11/01/2017 10/02/17   Gilda Crease, MD    Family History History reviewed. No pertinent family history.  Social History Social History   Tobacco Use    . Smoking status: Never Smoker  . Smokeless tobacco: Never Used  Substance Use Topics  . Alcohol use: No  . Drug use: No     Allergies   Dilaudid [hydromorphone hcl]   Review of Systems Review of Systems  Constitutional: Positive for fatigue.  Respiratory: Positive for cough and shortness of breath.   Cardiovascular: Positive for palpitations.  All other systems reviewed and are negative.    Physical Exam Updated Vital Signs BP 121/68   Pulse 95   Temp 99.2 F (37.3 C) (Oral)   Resp 18   LMP 12/20/2017   SpO2 93%   Physical Exam  Constitutional: She is oriented to person, place, and time. She appears well-developed and well-nourished. No distress.  HENT:  Head: Normocephalic and atraumatic.  Right Ear: Hearing normal.  Left Ear: Hearing normal.  Nose: Nose normal.  Mouth/Throat: Oropharynx is clear and moist and mucous membranes are normal.  Eyes: Pupils are equal, round, and reactive to light. Conjunctivae and EOM are normal.  Neck: Normal range of motion. Neck supple.  Cardiovascular: Regular rhythm, S1 normal and S2 normal. Tachycardia present. Exam reveals no gallop and no friction rub.  No murmur heard. Pulmonary/Chest: Effort normal and breath sounds normal. No respiratory distress. She exhibits no tenderness.  Abdominal: Soft. Normal appearance and bowel sounds are normal. There is no hepatosplenomegaly. There is no tenderness. There is no  rebound, no guarding, no tenderness at McBurney's point and negative Murphy's sign. No hernia.  Musculoskeletal: Normal range of motion.  Neurological: She is alert and oriented to person, place, and time. She has normal strength. No cranial nerve deficit or sensory deficit. Coordination normal. GCS eye subscore is 4. GCS verbal subscore is 5. GCS motor subscore is 6.  Skin: Skin is warm, dry and intact. No rash noted. No cyanosis.  Psychiatric: She has a normal mood and affect. Her speech is normal and behavior is normal.  Thought content normal.  Nursing note and vitals reviewed.    ED Treatments / Results  Labs (all labs ordered are listed, but only abnormal results are displayed) Labs Reviewed  CBC WITH DIFFERENTIAL/PLATELET - Abnormal; Notable for the following components:      Result Value   WBC 17.1 (*)    RBC 5.39 (*)    Platelets 485 (*)    Neutro Abs 13.0 (*)    All other components within normal limits  COMPREHENSIVE METABOLIC PANEL  URINALYSIS, ROUTINE W REFLEX MICROSCOPIC  I-STAT CG4 LACTIC ACID, ED    EKG None  Radiology Dg Chest 2 View  Result Date: 12/28/2017 CLINICAL DATA:  Fever, shortness of breath EXAM: CHEST - 2 VIEW COMPARISON:  09/10/2017 FINDINGS: Lungs are clear.  No pleural effusion or pneumothorax. The heart is normal in size. Visualized osseous structures are within normal limits. IMPRESSION: Normal chest radiographs. Electronically Signed   By: Charline Bills M.D.   On: 12/28/2017 01:59    Procedures Procedures (including critical care time)  Medications Ordered in ED Medications  levofloxacin (LEVAQUIN) tablet 750 mg (has no administration in time range)  sodium chloride 0.9 % bolus 1,000 mL (1,000 mLs Intravenous New Bag/Given 12/28/17 0208)  acetaminophen (TYLENOL) tablet 1,000 mg (1,000 mg Oral Given 12/28/17 0158)     Initial Impression / Assessment and Plan / ED Course  I have reviewed the triage vital signs and the nursing notes.  Pertinent labs & imaging results that were available during my care of the patient were reviewed by me and considered in my medical decision making (see chart for details).     Patient presents to the emergency department for evaluation of generalized weakness, feeling palpitations and short of breath.  Patient was noted to have low-grade fever here in the ER.  Her only symptoms are the cough and chest congestion.  Chest x-ray does not show evidence of pneumonia.  She does not have a urine infection.  Lab work reveals  leukocytosis which has been persistently seen in the past, otherwise no acute abnormality.  Normal lactic acid.  Slight tachycardia has resolved as her temperature has come down.  No hypotension.  Patient does not appear septic at this time.  Final Clinical Impressions(s) / ED Diagnoses   Final diagnoses:  Upper respiratory tract infection, unspecified type    ED Discharge Orders        Ordered    levofloxacin (LEVAQUIN) 750 MG tablet  Daily     12/28/17 0346       Gilda Crease, MD 12/28/17 [phone]

## 2018-03-05 ENCOUNTER — Encounter (HOSPITAL_BASED_OUTPATIENT_CLINIC_OR_DEPARTMENT_OTHER): Payer: Self-pay | Admitting: Emergency Medicine

## 2018-03-05 ENCOUNTER — Emergency Department (HOSPITAL_BASED_OUTPATIENT_CLINIC_OR_DEPARTMENT_OTHER): Payer: Medicaid Other

## 2018-03-05 ENCOUNTER — Emergency Department (HOSPITAL_BASED_OUTPATIENT_CLINIC_OR_DEPARTMENT_OTHER)
Admission: EM | Admit: 2018-03-05 | Discharge: 2018-03-05 | Disposition: A | Discharge status: Home / Self Care | Payer: Medicaid Other | Attending: Emergency Medicine | Admitting: Emergency Medicine

## 2018-03-05 ENCOUNTER — Other Ambulatory Visit: Payer: Self-pay

## 2018-03-05 DIAGNOSIS — Z79899 Other long term (current) drug therapy: Secondary | ICD-10-CM | POA: Insufficient documentation

## 2018-03-05 DIAGNOSIS — R110 Nausea: Secondary | ICD-10-CM | POA: Insufficient documentation

## 2018-03-05 DIAGNOSIS — R1084 Generalized abdominal pain: Secondary | ICD-10-CM | POA: Diagnosis present

## 2018-03-05 HISTORY — DX: Obesity, unspecified: E66.9

## 2018-03-05 LAB — COMPREHENSIVE METABOLIC PANEL
ALT: 11 U/L (ref 0–44)
AST: 18 U/L (ref 15–41)
Albumin: 3.4 g/dL — ABNORMAL LOW (ref 3.5–5.0)
Alkaline Phosphatase: 69 U/L (ref 38–126)
Anion gap: 7 (ref 5–15)
BUN: 10 mg/dL (ref 6–20)
CALCIUM: 8.7 mg/dL — AB (ref 8.9–10.3)
CO2: 25 mmol/L (ref 22–32)
CREATININE: 0.7 mg/dL (ref 0.44–1.00)
Chloride: 105 mmol/L (ref 98–111)
GFR calc Af Amer: >60 mL/min (ref >60)
GLUCOSE: 91 mg/dL (ref 70–99)
Potassium: 3.2 mmol/L — ABNORMAL LOW (ref 3.5–5.1)
Sodium: 137 mmol/L (ref 135–145)
TOTAL PROTEIN: 6.9 g/dL (ref 6.5–8.1)
Total Bilirubin: 0.2 mg/dL — ABNORMAL LOW (ref 0.3–1.2)

## 2018-03-05 LAB — CBC WITH DIFFERENTIAL/PLATELET
BASOS PCT: 0 %
Basophils Absolute: 0 10*3/uL (ref 0.0–0.1)
EOS ABS: 0.2 10*3/uL (ref 0.0–0.7)
Eosinophils Relative: 2 %
HCT: 39.8 % (ref 36.0–46.0)
HEMOGLOBIN: 13 g/dL (ref 12.0–15.0)
LYMPHS ABS: 2.4 10*3/uL (ref 0.7–4.0)
Lymphocytes Relative: 21 %
MCH: 26.1 pg (ref 26.0–34.0)
MCHC: 32.7 g/dL (ref 30.0–36.0)
MCV: 79.9 fL (ref 78.0–100.0)
MONO ABS: 0.8 10*3/uL (ref 0.1–1.0)
Monocytes Relative: 7 %
NEUTROS ABS: 8.1 10*3/uL — AB (ref 1.7–7.7)
Neutrophils Relative %: 70 %
PLATELETS: 395 10*3/uL (ref 150–400)
RBC: 4.98 MIL/uL (ref 3.87–5.11)
RDW: 15.5 % (ref 11.5–15.5)
WBC: 11.5 10*3/uL — ABNORMAL HIGH (ref 4.0–10.5)

## 2018-03-05 LAB — URINALYSIS, ROUTINE W REFLEX MICROSCOPIC
Bilirubin Urine: NEGATIVE
GLUCOSE, UA: NEGATIVE mg/dL
HGB URINE DIPSTICK: NEGATIVE
KETONES UR: NEGATIVE mg/dL
Nitrite: NEGATIVE
PROTEIN: NEGATIVE mg/dL
Specific Gravity, Urine: 1.01 (ref 1.005–1.030)
pH: 7 (ref 5.0–8.0)

## 2018-03-05 LAB — URINALYSIS, MICROSCOPIC (REFLEX): RBC / HPF: NONE SEEN RBC/hpf (ref 0–5)

## 2018-03-05 LAB — PREGNANCY, URINE: Preg Test, Ur: NEGATIVE

## 2018-03-05 LAB — LIPASE, BLOOD: Lipase: 19 U/L (ref 11–51)

## 2018-03-05 MED ORDER — ONDANSETRON HCL 4 MG PO TABS: 4.0000 mg | ORAL_TABLET | Freq: Three times a day (TID) | ORAL | 0 refills | Status: DC | PRN

## 2018-03-05 MED ORDER — SODIUM CHLORIDE 0.9 % IV BOLUS
1000.0000 mL | Freq: Once | INTRAVENOUS | Status: AC
  Administered 2018-03-05: 1000 mL via INTRAVENOUS

## 2018-03-05 MED ORDER — DICYCLOMINE HCL 20 MG PO TABS
20.0000 mg | ORAL_TABLET | Freq: Two times a day (BID) | ORAL | 0 refills | Status: DC
Start: 2018-03-05 — End: 2019-03-30

## 2018-03-05 MED ORDER — IOPAMIDOL (ISOVUE-300) INJECTION 61%
100.0000 mL | Freq: Once | INTRAVENOUS | Status: AC | PRN
  Administered 2018-03-05: 100 mL via INTRAVENOUS

## 2018-03-05 NOTE — ED Notes (Signed)
Patient transported to CT 

## 2018-03-05 NOTE — ED Notes (Signed)
Pt given hat for urine sample but states she cannot get one right now.

## 2018-03-05 NOTE — ED Triage Notes (Addendum)
Bilateral low abd pain and bil flank pain with urinary frequency and dysuria x4 days.

## 2018-03-05 NOTE — Discharge Instructions (Addendum)
Please follow-up with your primary care provider regarding your visit today. Follow-up with your OB/GYN as well regarding your visit today.  You have refused a pelvic examination today which I believe would complete your work-up, please follow-up with your OB/GYN as soon as possible to have this done to look for signs of infection and other possible causes for your pain. You may return to the emergency department at any time for further evaluation. Please return to the emergency department for any new or worsening symptoms or if your symptoms do not improve. You may use the antinausea medication, Zofran, as prescribed. Please be sure to drink plenty water to avoid dehydration.  Contact a health care provider if: Your abdominal pain changes or gets worse. You are not hungry or you lose weight without trying. You are constipated or have diarrhea for more than 2-3 days. You have pain when you urinate or have a bowel movement. Your abdominal pain wakes you up at night. Your pain gets worse with meals, after eating, or with certain foods. You are throwing up and cannot keep anything down. You have a fever. Get help right away if: Your pain does not go away as soon as your health care provider told you to expect. You cannot stop throwing up. Your pain is only in areas of the abdomen, such as the right side or the left lower portion of the abdomen. You have bloody or black stools, or stools that look like tar. You have severe pain, cramping, or bloating in your abdomen. You have signs of dehydration, such as: Dark urine, very little urine, or no urine. Cracked lips. Dry mouth. Sunken eyes. Sleepiness. Weakness.

## 2018-03-05 NOTE — ED Notes (Signed)
Patient attempted to provide urine sample. Not enough urine for a sample.

## 2018-03-05 NOTE — ED Provider Notes (Signed)
[facility] EMERGENCY DEPARTMENT Provider Note   CSN: [id number] Arrival date & time: 03/05/18  1808     History   Chief Complaint Chief Complaint  Patient presents with  . Urinary Frequency    HPI Deborah Mcbride is a 32 y.o. female presenting with a 4-day history of left flank and left-sided abdominal pain.  Patient states that the pain began gradually and she describes it as a throbbing constant pain worse with movement and palpation.  Patient also endorses dysuria that began this morning.  Patient states that she has had UTIs in the past but has never felt this bad before.  Associated symptoms include nausea.  Patient denies history of fever, chest pain/shortness of breath, vomiting.  Patient states that her last bowel movement was this morning and was loose.  HPI  Past Medical History:  Diagnosis Date  . Obesity     There are no active problems to display for this patient.   Past Surgical History:  Procedure Laterality Date  . CESAREAN SECTION    . CHOLECYSTECTOMY    . TONSILLECTOMY    . TUBAL LIGATION       OB History   None      Home Medications    Prior to Admission medications   Medication Sig Start Date End Date Taking? Authorizing Provider  dicyclomine (BENTYL) 20 MG tablet Take 1 tablet (20 mg total) by mouth 2 (two) times daily. 03/05/18   Harlene SaltsMorelli, Yamari Ventola A, PA-C  ibuprofen (ADVIL,MOTRIN) 200 MG tablet Take 400-600 mg by mouth every 6 (six) hours as needed for moderate pain.    [provider]  levofloxacin (LEVAQUIN) 750 MG tablet Take 1 tablet (750 mg total) by mouth daily. 12/28/17   Gilda CreasePollina, Christopher J, MD  ondansetron (ZOFRAN) 4 MG tablet Take 1 tablet (4 mg total) by mouth every 8 (eight) hours as needed for nausea or vomiting. 03/05/18   Harlene SaltsMorelli, Anuja Manka A, PA-C  traMADol (ULTRAM) 50 MG tablet Take 1 tablet (50 mg total) by mouth every 6 (six) hours as needed. Patient not taking: Reported on 11/01/2017 10/02/17   Gilda CreasePollina,  Christopher J, MD    Family History No family history on file.  Social History Social History   Tobacco Use  . Smoking status: Never Smoker  . Smokeless tobacco: Never Used  Substance Use Topics  . Alcohol use: No  . Drug use: No     Allergies   Dilaudid [hydromorphone hcl]   Review of Systems Review of Systems  Constitutional: Negative.  Negative for chills and fever.  HENT: Negative.  Negative for rhinorrhea and sore throat.   Eyes: Negative.  Negative for visual disturbance.  Respiratory: Negative.  Negative for cough and shortness of breath.   Cardiovascular: Negative.  Negative for chest pain.  Gastrointestinal: Positive for abdominal pain and nausea. Negative for blood in stool, diarrhea and vomiting.  Genitourinary: Positive for dysuria, flank pain and urgency. Negative for hematuria, pelvic pain, vaginal bleeding and vaginal discharge.  Musculoskeletal: Negative for arthralgias and myalgias.  Skin: Negative.  Negative for rash.  Neurological: Negative.  Negative for dizziness, weakness and headaches.     Physical Exam Updated Vital Signs BP (!) 146/93 (BP Location: Right Arm)   Pulse 94   Temp 98.7 F (37.1 C) (Oral)   Resp 18   Wt (!) 152 kg (335 lb 1.6 oz)   LMP 02/10/2018   SpO2 98%   BMI 59.36 kg/m   Physical Exam  Constitutional:  She is oriented to person, place, and time. She appears well-developed and well-nourished. She appears distressed.  HENT:  Head: Normocephalic and atraumatic.  Right Ear: External ear normal.  Left Ear: External ear normal.  Nose: Nose normal.  Eyes: Pupils are equal, round, and reactive to light. EOM are normal.  Neck: Trachea normal and normal range of motion. No tracheal deviation present.  Cardiovascular: Normal rate, regular rhythm, normal heart sounds and intact distal pulses.  Pulmonary/Chest: Effort normal and breath sounds normal. No respiratory distress. She has no wheezes.  Abdominal: Soft. There is  generalized tenderness. There is no rigidity, no rebound and no guarding.  Obese abdomen.  Genitourinary:  Genitourinary Comments: Deferred by patient x2.  Musculoskeletal: Normal range of motion.  Neurological: She is alert and oriented to person, place, and time.  Skin: Skin is warm and dry.  Psychiatric: She has a normal mood and affect. Her behavior is normal.     ED Treatments / Results  Labs (all labs ordered are listed, but only abnormal results are displayed) Labs Reviewed  URINALYSIS, ROUTINE W REFLEX MICROSCOPIC - Abnormal; Notable for the following components:      Result Value   Leukocytes, UA SMALL (*)    All other components within normal limits  CBC WITH DIFFERENTIAL/PLATELET - Abnormal; Notable for the following components:   WBC 11.5 (*)    Neutro Abs 8.1 (*)    All other components within normal limits  URINALYSIS, MICROSCOPIC (REFLEX) - Abnormal; Notable for the following components:   Bacteria, UA RARE (*)    All other components within normal limits  COMPREHENSIVE METABOLIC PANEL - Abnormal; Notable for the following components:   Potassium 3.2 (*)    Calcium 8.7 (*)    Albumin 3.4 (*)    Total Bilirubin 0.2 (*)    All other components within normal limits  URINE CULTURE  PREGNANCY, URINE  LIPASE, BLOOD    EKG None  Radiology Ct Abdomen Pelvis W Contrast  Result Date: 03/05/2018 CLINICAL DATA:  Acute onset of bilateral flank pain, increased urinary frequency and dysuria. EXAM: CT ABDOMEN AND PELVIS WITH CONTRAST TECHNIQUE: Multidetector CT imaging of the abdomen and pelvis was performed using the standard protocol following bolus administration of intravenous contrast. CONTRAST:  ISOVUE-300 IOPAMIDOL (ISOVUE-300) INJECTION 61% COMPARISON:  CT of the abdomen and pelvis from 11/01/2017 FINDINGS: Lower chest: The visualized lung bases are grossly clear. The visualized portions of the mediastinum are unremarkable. Hepatobiliary: The liver is  unremarkable in appearance. The patient is status post cholecystectomy, with clips noted at the gallbladder fossa. The common bile duct remains normal in caliber. Pancreas: The pancreas is within normal limits. Spleen: The spleen is unremarkable in appearance. Adrenals/Urinary Tract: The adrenal glands are unremarkable in appearance. The kidneys are within normal limits. There is no evidence of hydronephrosis. No renal or ureteral stones are identified. No perinephric stranding is seen. Stomach/Bowel: The stomach is unremarkable in appearance. The small bowel is within normal limits. The appendix is normal in caliber, without evidence of appendicitis. The colon is unremarkable in appearance. Vascular/Lymphatic: The abdominal aorta is unremarkable in appearance. The inferior vena cava is grossly unremarkable. No retroperitoneal lymphadenopathy is seen. No pelvic sidewall lymphadenopathy is identified. Reproductive: The bladder is mildly distended and grossly unremarkable. The uterus is unremarkable in appearance. The ovaries are relatively symmetric. No suspicious adnexal masses are seen. Other: No additional soft tissue abnormalities are seen. Musculoskeletal: No acute osseous abnormalities are identified. 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 The visualized musculature is unremarkable in appearance. IMPRESSION: 1. No acute abnormality seen within the abdomen or pelvis. 2. Chronic bilateral pars defects at L5, without evidence of anterolisthesis. Electronically Signed   By: Roanna Raider M.D.   On: 03/05/2018 22:23    Procedures Procedures (including critical care time)  Medications Ordered in ED Medications  sodium chloride 0.9 % bolus 1,000 mL ( Intravenous Stopped 03/05/18 2019)  sodium chloride 0.9 % bolus 1,000 mL (0 mLs Intravenous Stopped 03/05/18 2212)  iopamidol (ISOVUE-300) 61 % injection 100 mL (100 mLs Intravenous Contrast Given 03/05/18 2207)      Initial Impression / Assessment and Plan / ED Course  I have reviewed the triage vital signs and the nursing notes.  Pertinent labs & imaging results that were available during my care of the patient were reviewed by me and considered in my medical decision making (see chart for details).  Clinical Course as of Mar 06 13  Mon Mar 05, 2018  2307 Patient ambulatory in emergency department with her children, no acute distress.   [BM]  2320 Patient has declined pelvic examination at this time.  I have informed the patient that this would complete her work-up however she still refused stating that she wishes to follow-up with her OB/GYN tomorrow for further evaluation instead.   [BM]    Clinical Course User Index [BM] Bill Salinas, PA-C   32 year old female presenting abdominal pain. At time of discharge Patient is afebrile, non-toxic appearing, sitting comfortably on examination table. Patient's pain and other symptoms adequately managed in emergency department. Fluid bolus given. Labs, imaging and vitals reviewed.  Patient does not meet the SIRS or Sepsis criteria.  CBC nonacute, CMP nonacute, lipase within normal limits, urinalysis does not show overt urinary tract infection, sent for urine culture.  Urine Preg negative. CT abdomen/pelvis without acute abnormalities.  Patient with associated symptom of dysuria, unremarkable urinalysis sent for culture.  I informed the patient that a pelvic exam would complete her examination today to look for signs of infection or other causes for her pain.  Patient refused pelvic examination today stating that she we will follow-up with her OB/GYN for pelvic examination tomorrow.  I have informed her that it would be against medical advise to not complete her work-up today and I informed her of the dangers of potential pelvic infections.  Patient again denied pelvic examination today and also denies vaginal symptoms including discharge..  At  discharge patient is sitting comfortably on edge of bed no acute distress, stating that her pain has somewhat diminished and she wishes to be discharged at this time.  Patient given prescription for Zofran for nausea as well as Bentyl which the patient says has worked somewhat for her in the past.  Patient encouraged to follow-up with her OB/GYN as soon as possible as well as with her primary care provider.  Patient appears reliable for follow-up and safe for discharge at this time.  At this time there does not appear to be any evidence of an acute emergency medical condition and the patient appears stable for discharge with appropriate outpatient follow up. Diagnosis was discussed with patient who verbalizes understanding of care plan and is agreeable to discharge. I have discussed return precautions with patient and family at bedside who verbalize understanding of return precautions. Patient strongly encouraged to follow-up with their OB/GYN and PCP. All questions answered.  Patient informed that she may return  to the emergency department at any time for further evaluation.  This patient's case was discussed with Dr. Criss Alvine who agrees that a pelvic exam would complete her work-up today however at this time she appears safe and reliable for follow-up with her OB/GYN and can be discharged.   Note: Portions of this report may have been transcribed using voice recognition software. Every effort was made to ensure accuracy; however, inadvertent computerized transcription errors may still be present.    Final Clinical Impressions(s) / ED Diagnoses   Final diagnoses:  Generalized abdominal pain    ED Discharge Orders        Ordered    ondansetron (ZOFRAN) 4 MG tablet  Every 8 hours PRN     03/05/18 2325    dicyclomine (BENTYL) 20 MG tablet  2 times daily     03/05/18 2330       Elizabeth Palau 03/06/18 Raford Pitcher, MD 03/06/18 1539

## 2018-03-07 DIAGNOSIS — R10811 Right upper quadrant abdominal tenderness: Secondary | ICD-10-CM | POA: Diagnosis not present

## 2018-03-07 DIAGNOSIS — R10815 Periumbilic abdominal tenderness: Secondary | ICD-10-CM | POA: Diagnosis not present

## 2018-03-07 DIAGNOSIS — N390 Urinary tract infection, site not specified: Secondary | ICD-10-CM | POA: Insufficient documentation

## 2018-03-07 DIAGNOSIS — R350 Frequency of micturition: Secondary | ICD-10-CM | POA: Diagnosis present

## 2018-03-07 DIAGNOSIS — R1031 Right lower quadrant pain: Secondary | ICD-10-CM | POA: Diagnosis not present

## 2018-03-07 DIAGNOSIS — R112 Nausea with vomiting, unspecified: Secondary | ICD-10-CM | POA: Diagnosis not present

## 2018-03-07 LAB — URINE CULTURE

## 2018-03-07 MED ORDER — ONDANSETRON 4 MG PO TBDP
4.0000 mg | ORAL_TABLET | Freq: Once | ORAL | Status: AC | PRN
  Administered 2018-03-07: 4 mg via ORAL
  Filled 2018-03-07: qty 1

## 2018-03-07 MED ORDER — KETOROLAC TROMETHAMINE 30 MG/ML IJ SOLN
30.00 | INTRAMUSCULAR | Status: DC
Start: ? — End: 2018-03-07

## 2018-03-07 NOTE — ED Triage Notes (Signed)
Pt from home via EMS with c/o flank pain, urinary frequency, nausea with 1 episode of emesis, and abdominal pain that began 8/1. [facility]. Pt was told she is not completely emptying her bladder.   138/92 18 RR, 94 HR

## 2018-03-08 ENCOUNTER — Emergency Department (HOSPITAL_COMMUNITY)
Admission: EM | Admit: 2018-03-08 | Discharge: 2018-03-08 | Disposition: A | Discharge status: Home / Self Care | Payer: Medicaid Other | Attending: Emergency Medicine | Admitting: Emergency Medicine

## 2018-03-08 DIAGNOSIS — M7918 Myalgia, other site: Secondary | ICD-10-CM

## 2018-03-08 DIAGNOSIS — N390 Urinary tract infection, site not specified: Secondary | ICD-10-CM

## 2018-03-08 LAB — URINALYSIS, ROUTINE W REFLEX MICROSCOPIC
BILIRUBIN URINE: NEGATIVE
GLUCOSE, UA: NEGATIVE mg/dL
KETONES UR: NEGATIVE mg/dL
Nitrite: NEGATIVE
PROTEIN: NEGATIVE mg/dL
Specific Gravity, Urine: 1.017 (ref 1.005–1.030)
pH: 6 (ref 5.0–8.0)

## 2018-03-08 LAB — COMPREHENSIVE METABOLIC PANEL
ALBUMIN: 3.7 g/dL (ref 3.5–5.0)
ALT: 13 U/L (ref 0–44)
AST: 15 U/L (ref 15–41)
Alkaline Phosphatase: 72 U/L (ref 38–126)
Anion gap: 10 (ref 5–15)
BUN: 10 mg/dL (ref 6–20)
CHLORIDE: 104 mmol/L (ref 98–111)
CO2: 26 mmol/L (ref 22–32)
CREATININE: 0.97 mg/dL (ref 0.44–1.00)
Calcium: 9.3 mg/dL (ref 8.9–10.3)
GFR calc Af Amer: >60 mL/min (ref >60)
GFR calc non Af Amer: >60 mL/min (ref >60)
Glucose, Bld: 92 mg/dL (ref 70–99)
POTASSIUM: 3.4 mmol/L — AB (ref 3.5–5.1)
SODIUM: 140 mmol/L (ref 135–145)
Total Bilirubin: 0.4 mg/dL (ref 0.3–1.2)
Total Protein: 7.2 g/dL (ref 6.5–8.1)

## 2018-03-08 LAB — CBC
HCT: 39.7 % (ref 36.0–46.0)
Hemoglobin: 13 g/dL (ref 12.0–15.0)
MCH: 26.4 pg (ref 26.0–34.0)
MCHC: 32.7 g/dL (ref 30.0–36.0)
MCV: 80.5 fL (ref 78.0–100.0)
PLATELETS: 441 10*3/uL — AB (ref 150–400)
RBC: 4.93 MIL/uL (ref 3.87–5.11)
RDW: 15.2 % (ref 11.5–15.5)
WBC: 9.9 10*3/uL (ref 4.0–10.5)

## 2018-03-08 LAB — LIPASE, BLOOD: Lipase: 21 U/L (ref 11–51)

## 2018-03-08 MED ORDER — CYCLOBENZAPRINE HCL 10 MG PO TABS
ORAL_TABLET | ORAL | Status: AC
  Administered 2018-03-08: 10 mg
  Filled 2018-03-08: qty 1

## 2018-03-08 MED ORDER — DICYCLOMINE HCL 10 MG/ML IM SOLN
20.0000 mg | Freq: Once | INTRAMUSCULAR | Status: AC
  Administered 2018-03-08: 20 mg via INTRAMUSCULAR
  Filled 2018-03-08: qty 2

## 2018-03-08 MED ORDER — CEPHALEXIN 500 MG PO CAPS
ORAL_CAPSULE | ORAL | Status: AC
  Administered 2018-03-08: 500 mg
  Filled 2018-03-08: qty 1

## 2018-03-08 MED ORDER — CYCLOBENZAPRINE HCL 5 MG PO TABS: 5.0000 mg | ORAL_TABLET | Freq: Three times a day (TID) | ORAL | 0 refills | Status: DC | PRN

## 2018-03-08 MED ORDER — CEPHALEXIN 500 MG PO CAPS: 500.0000 mg | ORAL_CAPSULE | Freq: Three times a day (TID) | ORAL | 0 refills | Status: DC

## 2018-03-08 NOTE — ED Notes (Signed)
Discharge instructions reviewed with pt. Pt verbalized understanding. Pt to follow up with PCP. Pt given scripts to take to pharmacy. Pt assisted to waiting room via wheelchair.

## 2018-03-08 NOTE — Discharge Instructions (Addendum)
Use heat for comfort.  Get the [facility]roxen you are [facility] by [facility] and start taking it with the Flexeril you were prescribed tonight.  Take the Bentyl you were prescribed earlier this week for your discomfort.  Take the antibiotics until gone.  Use the Zofran you were prescribed earlier this week for your nausea or vomiting.  Recheck if you get a fever or have uncontrolled vomiting.

## 2018-03-08 NOTE — ED Provider Notes (Signed)
[facility]-EMERGENCY DEPT Provider Note   CSN: [id number] Arrival date & time: 03/07/18  2309  Time seen 01:45 AM  History   Chief Complaint Chief Complaint  Patient presents with  . Back Pain  . Urinary Frequency  . Abdominal Pain    HPI Deborah Mcbride is a 32 y.o. female.  HPI patient states August 2 she started getting pain in her right flank that radiates into her right abdomen.  She states she also had urinary frequency.  She states the pain is been getting gradually worse.  She states the pain has been there constantly.  She describes it as a pressure feeling.  She was seen at [facility] on August 5 and had a CT abdomen/pelvis done which was normal and a normal urinalysis.  She states she was not given any treatment when she was discharged.  She went to [facility] on August 7 and she states they did a bladder scan after she had urinated and she had 120 cc of urine in her bladder and they are arranging for her to get a urology referral.  She states tonight she started having nausea and started having vomiting, she states she had one episode of several smaller episodes of vomiting.  This was about 10:30 PM.  She is unsure if fever but denies chills.  She denies coughing or shortness of breath.  She states her abdomen is bloating when she eats today.  She is status post cholecystectomy.  She states she has pain over her suprapubic area in her right upper abdomen.  She states she is been told she had UTIs in the past when they did testing for other things and she did not have symptoms.  She states when she went to [facility] they told her to take Tylenol and ibuprofen for pain.  PCP Patient, No Pcp Per   Past Medical History:  Diagnosis Date  . Obesity     There are no active problems to display for this patient.   Past Surgical History:  Procedure Laterality Date  . CESAREAN SECTION    . CHOLECYSTECTOMY    . TONSILLECTOMY    .  TUBAL LIGATION       OB History   None      Home Medications    Prior to Admission medications   Medication Sig Start Date End Date Taking? Authorizing Provider  ibuprofen (ADVIL,MOTRIN) 200 MG tablet Take 400-600 mg by mouth every 6 (six) hours as needed for moderate pain.   Yes [provider]  cephALEXin (KEFLEX) 500 MG capsule Take 1 capsule (500 mg total) by mouth 3 (three) times daily. 03/08/18   Devoria Albe, MD  cyclobenzaprine (FLEXERIL) 5 MG tablet Take 1 tablet (5 mg total) by mouth 3 (three) times daily as needed for muscle spasms. 03/08/18   Devoria Albe, MD  dicyclomine (BENTYL) 20 MG tablet Take 1 tablet (20 mg total) by mouth 2 (two) times daily. Patient not taking: Reported on 03/08/2018 03/05/18   Harlene Salts A, PA-C  levofloxacin (LEVAQUIN) 750 MG tablet Take 1 tablet (750 mg total) by mouth daily. Patient not taking: Reported on 03/08/2018 12/28/17   Gilda Crease, MD  ondansetron (ZOFRAN) 4 MG tablet Take 1 tablet (4 mg total) by mouth every 8 (eight) hours as needed for nausea or vomiting. Patient not taking: Reported on 03/08/2018 03/05/18   Harlene Salts A, PA-C  traMADol (ULTRAM) 50 MG tablet Take 1 tablet (50  mg total) by mouth every 6 (six) hours as needed. Patient not taking: Reported on 11/01/2017 10/02/17   Gilda Crease, MD    Family History No family history on file.  Social History Social History   Tobacco Use  . Smoking status: Never Smoker  . Smokeless tobacco: Never Used  Substance Use Topics  . Alcohol use: No  . 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    Physical Exam Updated Vital Signs BP 130/67 (BP Location: Left Arm)   Pulse 88   Temp 98.1 F (36.7 C) (Oral)   Resp 20   Ht 5\' 3"  (1.6 m)   LMP 02/10/2018   SpO2 99%   BMI 59.36 kg/m   Vital signs normal    Physical Exam    Constitutional: She is oriented to person, place, and time. She appears well-developed and well-nourished.  Non-toxic appearance. She does not appear ill. No distress.  HENT:  Head: Normocephalic and atraumatic.  Right Ear: External ear normal.  Left Ear: External ear normal.  Nose: Nose normal. No mucosal edema or rhinorrhea.  Mouth/Throat: Oropharynx is clear and moist and mucous membranes are normal. No dental abscesses or uvula swelling.  Eyes: Pupils are equal, round, and reactive to light. Conjunctivae and EOM are normal.  Neck: Normal range of motion and full passive range of motion without pain. Neck supple.  Cardiovascular: Normal rate, regular rhythm and normal heart sounds. Exam reveals no gallop and no friction rub.  No murmur heard. Pulmonary/Chest: Effort normal and breath sounds normal. No respiratory distress. She has no wheezes. She has no rhonchi. She has no rales. She exhibits no tenderness and no crepitus.  Abdominal: Soft. Normal appearance and bowel sounds are normal. She exhibits no distension. There is no tenderness. There is no rebound and no guarding.  Plus right CVA tenderness, she is tender over the suprapubic area and the right upper quadrant.  Musculoskeletal: Normal range of motion. She exhibits no edema or tenderness.  Moves all extremities well.   Neurological: She is alert and oriented to person, place, and time. She has normal strength. No cranial nerve deficit.  Skin: Skin is warm, dry and intact. No rash noted. No erythema. No pallor.  Psychiatric: She has a normal mood and affect. Her speech is normal and behavior is normal. Her mood appears not anxious.  Nursing note and vitals reviewed.    ED Treatments / Results  Labs (all labs ordered are listed, but only abnormal results are displayed)  Results for orders placed or performed during the hospital encounter of 03/08/18  CBC  Result Value Ref Range   WBC 9.9 4.0 - 10.5 K/uL   RBC 4.93 3.87 -  5.11 MIL/uL   Hemoglobin 13.0 12.0 - 15.0 g/dL   HCT 16.1 09.6 - 04.5 %   MCV 80.5 78.0 - 100.0 fL   MCH 26.4 26.0 - 34.0 pg   MCHC 32.7 30.0 - 36.0 g/dL   RDW 40.9 81.1 - 91.4 %   Platelets 441 (H) 150 - 400 K/uL  Comprehensive metabolic panel  Result Value Ref Range   Sodium 140 135 - 145 mmol/L   Potassium 3.4 (L) 3.5 - 5.1 mmol/L   Chloride 104 98 - 111 mmol/L   CO2 26 22 - 32 mmol/L   Glucose, Bld 92 70 - 99  mg/dL   BUN 10 6 - 20 mg/dL   Creatinine, Ser 1.610.97 0.44 - 1.00 mg/dL   Calcium 9.3 8.9 - 09.610.3 mg/dL   Total Protein 7.2 6.5 - 8.1 g/dL   Albumin 3.7 3.5 - 5.0 g/dL   AST 15 15 - 41 U/L   ALT 13 0 - 44 U/L   Alkaline Phosphatase 72 38 - 126 U/L   Total Bilirubin 0.4 0.3 - 1.2 mg/dL   GFR calc non Af Amer >60 >60 mL/min   GFR calc Af Amer >60 >60 mL/min   Anion gap 10 5 - 15  Lipase, blood  Result Value Ref Range   Lipase 21 11 - 51 U/L  Urinalysis, Routine w reflex microscopic  Result Value Ref Range   Color, Urine YELLOW YELLOW   APPearance HAZY (A) CLEAR   Specific Gravity, Urine 1.017 1.005 - 1.030   pH 6.0 5.0 - 8.0   Glucose, UA NEGATIVE NEGATIVE mg/dL   Hgb urine dipstick SMALL (A) NEGATIVE   Bilirubin Urine NEGATIVE NEGATIVE   Ketones, ur NEGATIVE NEGATIVE mg/dL   Protein, ur NEGATIVE NEGATIVE mg/dL   Nitrite NEGATIVE NEGATIVE   Leukocytes, UA SMALL (A) NEGATIVE   RBC / HPF 0-5 0 - 5 RBC/hpf   WBC, UA 6-10 0 - 5 WBC/hpf   Bacteria, UA RARE (A) NONE SEEN   Squamous Epithelial / LPF 0-5 0 - 5   Mucus PRESENT    Laboratory interpretation all normal except possible UTI    EKG None  Radiology No results found.   Ct Abdomen Pelvis W Contrast  Result Date: 03/05/2018 CLINICAL DATA:  Acute onset of bilateral flank pain, increased urinary frequency and dysuria. Marland Kitchen. IMPRESSION: 1. No acute abnormality seen within the abdomen or pelvis. 2. Chronic bilateral pars defects at L5, without evidence of anterolisthesis. Electronically Signed   By: Roanna RaiderJeffery  Chang  M.D.   On: 03/05/2018 22:23   Procedures Procedures (including critical care time)  Medications Ordered in ED Medications  ondansetron (ZOFRAN-ODT) disintegrating tablet 4 mg (4 mg Oral Given 03/07/18 2321)  dicyclomine (BENTYL) injection 20 mg (20 mg Intramuscular Given 03/08/18 0326)  cyclobenzaprine (FLEXERIL) 10 MG tablet (10 mg  Given 03/08/18 0326)  cephALEXin (KEFLEX) 500 MG capsule (500 mg  Given 03/08/18 0326)     Initial Impression / Assessment and Plan / ED Course  I have reviewed the triage vital signs and the nursing notes.  Pertinent labs & imaging results that were available during my care of the patient were reviewed by me and considered in my medical decision making (see chart for details).     Patient had a urine culture done on the fifth which showed multiple species.  Patient was given Zofran for her nausea, Bentyl IM for her pain.  She was given oral Flexeril for pain and started on oral Keflex while waiting for her test to return.  I talked to the patient at time of discharge, her urine now is slightly suggestive of UTI, she will be discharged home on Keflex.    Review of her chart show patient has been prescribed Zofran, Bentyl, and naproxen for the same pain that she can take.  Final Clinical Impressions(s) / ED Diagnoses   Final diagnoses:  Urinary tract infection without hematuria, site unspecified  Musculoskeletal pain    ED Discharge Orders         Ordered    cephALEXin (KEFLEX) 500 MG capsule  3 times daily  03/08/18 0536    cyclobenzaprine (FLEXERIL) 5 MG tablet  3 times daily PRN     03/08/18 0536          Plan discharge  Devoria Albe, MD, Concha Pyo, MD 03/08/18 [phone]

## 2018-03-09 LAB — URINE CULTURE

## 2019-03-30 ENCOUNTER — Encounter (HOSPITAL_BASED_OUTPATIENT_CLINIC_OR_DEPARTMENT_OTHER): Payer: Self-pay | Admitting: Emergency Medicine

## 2019-03-30 ENCOUNTER — Emergency Department (HOSPITAL_BASED_OUTPATIENT_CLINIC_OR_DEPARTMENT_OTHER)
Admission: EM | Admit: 2019-03-30 | Discharge: 2019-03-30 | Disposition: A | Discharge status: Home / Self Care | Payer: Medicaid Other | Attending: Emergency Medicine | Admitting: Emergency Medicine

## 2019-03-30 ENCOUNTER — Emergency Department (HOSPITAL_BASED_OUTPATIENT_CLINIC_OR_DEPARTMENT_OTHER): Payer: Medicaid Other

## 2019-03-30 ENCOUNTER — Other Ambulatory Visit: Payer: Self-pay

## 2019-03-30 DIAGNOSIS — R590 Localized enlarged lymph nodes: Secondary | ICD-10-CM | POA: Insufficient documentation

## 2019-03-30 DIAGNOSIS — Z3202 Encounter for pregnancy test, result negative: Secondary | ICD-10-CM | POA: Diagnosis not present

## 2019-03-30 DIAGNOSIS — R1031 Right lower quadrant pain: Secondary | ICD-10-CM | POA: Diagnosis present

## 2019-03-30 DIAGNOSIS — R300 Dysuria: Secondary | ICD-10-CM | POA: Diagnosis not present

## 2019-03-30 DIAGNOSIS — Z6841 Body Mass Index (BMI) 40.0 and over, adult: Secondary | ICD-10-CM | POA: Insufficient documentation

## 2019-03-30 DIAGNOSIS — E669 Obesity, unspecified: Secondary | ICD-10-CM | POA: Diagnosis not present

## 2019-03-30 LAB — BASIC METABOLIC PANEL
Anion gap: 10 (ref 5–15)
BUN: 10 mg/dL (ref 6–20)
CO2: 24 mmol/L (ref 22–32)
Calcium: 9.2 mg/dL (ref 8.9–10.3)
Chloride: 103 mmol/L (ref 98–111)
Creatinine, Ser: 0.81 mg/dL (ref 0.44–1.00)
GFR calc Af Amer: >60 mL/min (ref >60)
GFR calc non Af Amer: >60 mL/min (ref >60)
Glucose, Bld: 115 mg/dL — ABNORMAL HIGH (ref 70–99)
Potassium: 3.8 mmol/L (ref 3.5–5.1)
Sodium: 137 mmol/L (ref 135–145)

## 2019-03-30 LAB — CBC WITH DIFFERENTIAL/PLATELET
Abs Immature Granulocytes: 0.17 10*3/uL — ABNORMAL HIGH (ref 0.00–0.07)
Basophils Absolute: 0.1 10*3/uL (ref 0.0–0.1)
Basophils Relative: 1 %
Eosinophils Absolute: 0.2 10*3/uL (ref 0.0–0.5)
Eosinophils Relative: 2 %
HCT: 38.6 % (ref 36.0–46.0)
Hemoglobin: 12 g/dL (ref 12.0–15.0)
Immature Granulocytes: 2 %
Lymphocytes Relative: 24 %
Lymphs Abs: 2.5 10*3/uL (ref 0.7–4.0)
MCH: 25.5 pg — ABNORMAL LOW (ref 26.0–34.0)
MCHC: 31.1 g/dL (ref 30.0–36.0)
MCV: 82.1 fL (ref 80.0–100.0)
Monocytes Absolute: 0.6 10*3/uL (ref 0.1–1.0)
Monocytes Relative: 6 %
Neutro Abs: 6.8 10*3/uL (ref 1.7–7.7)
Neutrophils Relative %: 65 %
Platelets: 405 10*3/uL — ABNORMAL HIGH (ref 150–400)
RBC: 4.7 MIL/uL (ref 3.87–5.11)
RDW: 15.5 % (ref 11.5–15.5)
WBC: 10.3 10*3/uL (ref 4.0–10.5)
nRBC: 0 % (ref 0.0–0.2)

## 2019-03-30 LAB — URINALYSIS, ROUTINE W REFLEX MICROSCOPIC
Bilirubin Urine: NEGATIVE
Glucose, UA: NEGATIVE mg/dL
Hgb urine dipstick: NEGATIVE
Ketones, ur: NEGATIVE mg/dL
Leukocytes,Ua: NEGATIVE
Nitrite: NEGATIVE
Protein, ur: NEGATIVE mg/dL
Specific Gravity, Urine: 1.025 (ref 1.005–1.030)
pH: 5.5 (ref 5.0–8.0)

## 2019-03-30 LAB — PREGNANCY, URINE: Preg Test, Ur: NEGATIVE

## 2019-03-30 MED ORDER — OXYCODONE-ACETAMINOPHEN 5-325 MG PO TABS
1.0000 | ORAL_TABLET | Freq: Once | ORAL | Status: AC
  Administered 2019-03-30: 1 via ORAL
  Filled 2019-03-30: qty 1

## 2019-03-30 MED ORDER — FENTANYL CITRATE (PF) 100 MCG/2ML IJ SOLN
50.0000 ug | Freq: Once | INTRAMUSCULAR | Status: AC
  Administered 2019-03-30: 13:00:00 50 ug via INTRAVENOUS
  Filled 2019-03-30: qty 2

## 2019-03-30 MED ORDER — ONDANSETRON HCL 4 MG/2ML IJ SOLN
INTRAMUSCULAR | Status: AC
  Administered 2019-03-30: 4 mg
  Filled 2019-03-30: qty 2

## 2019-03-30 MED ORDER — IOHEXOL 300 MG/ML  SOLN
100.0000 mL | Freq: Once | INTRAMUSCULAR | Status: AC | PRN
  Administered 2019-03-30: 100 mL via INTRAVENOUS

## 2019-03-30 NOTE — Discharge Instructions (Signed)
Follow-up with Dr. Marin Olp for the swollen lymph nodes in your abdomen.

## 2019-03-30 NOTE — ED Provider Notes (Signed)
[facility] EMERGENCY DEPARTMENT Provider Note   CSN: [id number] Arrival date & time: 03/30/19  1042     History   Chief Complaint Chief Complaint  Patient presents with  . Back Pain    HPI Deborah Mcbride is a 33 y.o. female.     HPI Patient presents with right flank and abdominal pain.  Has had for around 4 days.  Some difficulty urinating/urinary frequency.  No fevers.  No nausea or vomiting.  States she has had difficulty sleeping due to the pain.  Has had previous urinary tract infections.  States no relief with Tylenol.  Denies pregnancy. Past Medical History:  Diagnosis Date  . Obesity     There are no active problems to display for this patient.   Past Surgical History:  Procedure Laterality Date  . CESAREAN SECTION    . CHOLECYSTECTOMY    . TONSILLECTOMY    . TUBAL LIGATION       OB History   No obstetric history on file.      Home Medications    Prior to Admission medications   Not on File    Family History No family history on file.  Social History Social History   Tobacco Use  . Smoking status: Never Smoker  . Smokeless tobacco: Never Used  Substance Use Topics  . Alcohol use: No  . Drug use: No     Allergies   Dilaudid [hydromorphone hcl]   Review of Systems Review of Systems  Constitutional: Negative for appetite change.  Respiratory: Negative for shortness of breath.   Cardiovascular: Negative for chest pain.  Gastrointestinal: Positive for abdominal pain.  Genitourinary: Positive for difficulty urinating, dysuria and flank pain.  Musculoskeletal: Positive for back pain.  Skin: Negative for rash.  Neurological: Negative for weakness.  Psychiatric/Behavioral: Negative for confusion.     Physical Exam Updated Vital Signs BP 112/64 (BP Location: Right Wrist)   Pulse 86   Temp 98.4 F (36.9 C) (Oral)   Resp 16   Ht 5\' 3"  (1.6 m)   Wt (!) 152 kg   LMP 03/14/2019   SpO2 98%   BMI 59.36 kg/m   Physical  Exam Vitals signs and nursing note reviewed.  Constitutional:      Comments: Patient is obese  HENT:     Head: Normocephalic.  Neck:     Musculoskeletal: Neck supple.  Cardiovascular:     Rate and Rhythm: Regular rhythm.  Abdominal:     Tenderness: There is abdominal tenderness.  Genitourinary:    Comments: Some right lower quadrant tenderness.  Right CVA tenderness. Skin:    General: Skin is warm.     Capillary Refill: Capillary refill takes less than 2 seconds.  Neurological:     General: No focal deficit present.     Mental Status: She is alert.      ED Treatments / Results  Labs (all labs ordered are listed, but only abnormal results are displayed) Labs Reviewed  BASIC METABOLIC PANEL - Abnormal; Notable for the following components:      Result Value   Glucose, Bld 115 (*)    All other components within normal limits  CBC WITH DIFFERENTIAL/PLATELET - Abnormal; Notable for the following components:   MCH 25.5 (*)    Platelets 405 (*)    Abs Immature Granulocytes 0.17 (*)    All other components within normal limits  PREGNANCY, URINE  URINALYSIS, ROUTINE W REFLEX MICROSCOPIC    EKG None  Radiology Ct Abdomen Pelvis W Contrast  Result Date: 03/30/2019 CLINICAL DATA:  Right lower quadrant abdominal pain for several days, worsening. EXAM: CT ABDOMEN AND PELVIS WITH CONTRAST TECHNIQUE: Multidetector CT imaging of the abdomen and pelvis was performed using the standard protocol following bolus administration of intravenous contrast. CONTRAST:  100mL OMNIPAQUE IOHEXOL 300 MG/ML  SOLN COMPARISON:  03/06/2018 CT abdomen/pelvis. FINDINGS: Lower chest: No significant pulmonary nodules or acute consolidative airspace disease. Hepatobiliary: Diffuse hepatic steatosis. No definite liver surface irregularity. No liver masses. Cholecystectomy. No biliary ductal dilatation. Pancreas: Normal, with no mass or duct dilation. Spleen: Normal size. No mass. Adrenals/Urinary Tract: Normal  adrenals. Normal kidneys with no hydronephrosis and no renal mass. Normal bladder. Stomach/Bowel: Normal non-distended stomach. Normal caliber small bowel with no small bowel wall thickening. Normal appendix. Normal large bowel with no diverticulosis, large bowel wall thickening or pericolonic fat stranding. Vascular/Lymphatic: Normal caliber abdominal aorta. Patent portal, splenic, hepatic and renal veins. Mild right mesenteric adenopathy up to 1.6 cm (series 2/image 50), increased from 1.0 cm on 03/05/2018. No additional pathologically enlarged lymph nodes in the abdomen or pelvis. Reproductive: Grossly normal uterus.  No adnexal mass. Other: No pneumoperitoneum, ascites or focal fluid collection. Musculoskeletal: No aggressive appearing focal osseous lesions. IMPRESSION: 1. No acute abnormality. No evidence of bowel obstruction or acute bowel inflammation. Normal appendix. 2. Nonspecific mild right mesenteric lymphadenopathy is mildly increased since 03/05/2018 CT. Consider heme-onc consultation for further evaluation. 3. Diffuse hepatic steatosis. Electronically Signed   By: Delbert PhenixJason A Poff M.D.   On: 03/30/2019 13:41    Procedures Procedures (including critical care time)  Medications Ordered in ED Medications  oxyCODONE-acetaminophen (PERCOCET/ROXICET) 5-325 MG per tablet 1 tablet (1 tablet Oral Given 03/30/19 1112)  fentaNYL (SUBLIMAZE) injection 50 mcg (50 mcg Intravenous Given 03/30/19 1245)  iohexol (OMNIPAQUE) 300 MG/ML solution 100 mL (100 mLs Intravenous Contrast Given 03/30/19 1314)  ondansetron (ZOFRAN) 4 MG/2ML injection (4 mg  Given 03/30/19 1245)     Initial Impression / Assessment and Plan / ED Course  I have reviewed the triage vital signs and the nursing notes.  Pertinent labs & imaging results that were available during my care of the patient were reviewed by me and considered in my medical decision making (see chart for details).        Patient with abdominal pain.  Right  lower quadrant and coming from back.  No fevers.  Lab work reassuring.  Urine reassuring.  CT scan done and showed some persistent and possibly worsening lymphadenopathy in her mesentery.  Will need outpatient follow-up.  Hematology oncology recommended by radiology.  Patient will be discharged home.  Final Clinical Impressions(s) / ED Diagnoses   Final diagnoses:  Right lower quadrant abdominal pain  Mesenteric lymphadenopathy    ED Discharge Orders    None       Benjiman CorePickering, Luvena Wentling, MD 03/30/19 [phone]

## 2019-03-30 NOTE — ED Triage Notes (Signed)
R lower back pain radiating into her abd x 4 days. Endorses dysuria.

## 2019-04-09 ENCOUNTER — Telehealth: Payer: Self-pay | Admitting: Physician Assistant

## 2019-04-09 ENCOUNTER — Telehealth: Payer: Self-pay | Admitting: *Deleted

## 2019-04-09 NOTE — Telephone Encounter (Signed)
Received a call from [name] to schedule an appt for mesenteric lymphadenopathy. She has been scheduled to see [name] on 9/21 at 1:30pm w/an arrival time 20 minutes early. She has been made aware of our no visitor policy.

## 2019-04-09 NOTE — Telephone Encounter (Signed)
Facsimile received from [employer] on 04-05-2019 4:36 pm from "LAVERE SHINSKY [phone]).  Trying to make appointment for the last three to four days but keeps getting transferred to the answering service.  Seen in [facility] a few days ago.  Told I have cancer, to make appointment with oncology because lymph nodes have doubled in size."

## 2019-04-22 ENCOUNTER — Inpatient Hospital Stay: Payer: Medicaid Other | Attending: Physician Assistant | Admitting: Physician Assistant

## 2019-04-22 ENCOUNTER — Encounter: Payer: Self-pay | Admitting: Physician Assistant

## 2019-04-22 ENCOUNTER — Other Ambulatory Visit: Payer: Self-pay

## 2019-04-22 VITALS — BP 153/77 | HR 80 | Temp 98.5°F | Resp 18 | Ht 63.0 in | Wt 348.2 lb

## 2019-04-22 DIAGNOSIS — K219 Gastro-esophageal reflux disease without esophagitis: Secondary | ICD-10-CM | POA: Diagnosis not present

## 2019-04-22 DIAGNOSIS — R591 Generalized enlarged lymph nodes: Secondary | ICD-10-CM | POA: Diagnosis not present

## 2019-04-22 DIAGNOSIS — E669 Obesity, unspecified: Secondary | ICD-10-CM | POA: Diagnosis not present

## 2019-04-22 DIAGNOSIS — G4733 Obstructive sleep apnea (adult) (pediatric): Secondary | ICD-10-CM | POA: Diagnosis not present

## 2019-04-22 DIAGNOSIS — R590 Localized enlarged lymph nodes: Secondary | ICD-10-CM

## 2019-04-22 NOTE — Progress Notes (Signed)
[facility] [phone]   [phone]  CONSULT NOTE  REFERRING PHYSICIAN: Benjiman CoreNathan Pickering MD  REASON FOR CONSULTATION:  1.6 cm right mesenteric lymph node  HPI Deborah Mcbride is a 33 y.o. female with a past medical history significant for obstructive sleep apnea and acid reflux presents to the clinic for evaluation of mesenteric lymphadenopathy.  The patient had presented to the emergency room on March 30, 2019 with the chief complaint of back pain, urinary frequency, abdominal pain, and dysuria. Work up with a urinalysis was negative for infection or any acute abnormality.  A CT of the abdomen and pelvis was performed which noted no acute abnormalities except for a mild 1.6 cm right mesenteric lymph node which was mildly increased from 1.0 cm when compared to a CT of the abdomen and pelvis on 03/05/2018.  The patient was referred to our clinic for further evaluation and recommendation regarding this finding.  The patient is currently feeling fine today without any concerning complaints. She denies any fever, chills, night sweats, weight loss, or palpable lymphadenopathy. She reports that she experiences mild fatigue at baseline. She has mild intermittent nausea but she denies any vomiting, diarrhea, constipation, anorexia, early satiety, or abdominal fullness.  She denies any headache, visual changes, rashes, or gait changes. She denies any bleeding or bruising including epistaxis, melena, hematochezia, hematuria, or vaginal bleeding.  The patient's family history significant for mother who has some unspecified urinary problem.  The patient is unsure of her father's medical history. 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  She has a son who is 31 years old with ADHD and autism.  She also has a 31 year old daughter has asthma and ADHD.  She has no history of drug abuse, smoking, or alcohol use.  Past Medical History:  Diagnosis Date     Obesity     Past Surgical History:  Procedure Laterality Date   CESAREAN SECTION     CHOLECYSTECTOMY     TONSILLECTOMY     TUBAL LIGATION      No family history on file.  Social History Social History   Tobacco Use   Smoking status: Never Smoker   Smokeless tobacco: Never Used  Substance Use Topics   Alcohol use: No   Drug use: No    Allergies  Allergen Reactions   Fentanyl Anaphylaxis   Dilaudid [Hydromorphone Hcl] Itching    No current outpatient medications on file.   No current facility-administered medications for this visit.     Review of Systems  Pertinent items noted in HPI and remainder of comprehensive ROS otherwise negative.  Physical Exam: Constitutional: Oriented to person, place, and time and well-developed, well-nourished, and in no distress.  HENT:  Head: Normocephalic and atraumatic.  Mouth/Throat: Oropharynx is clear and moist. No oropharyngeal exudate.  Eyes: Conjunctivae are normal. Right eye exhibits no discharge. Left eye exhibits no discharge. No scleral icterus.  Neck: Normal range of motion. Neck supple.  Cardiovascular: Normal rate, regular rhythm, normal heart sounds and intact distal pulses.   Pulmonary/Chest: Effort normal and breath sounds normal. No respiratory distress. No wheezes. No rales.  Abdominal: Soft. Bowel sounds are normal. Exhibits no distension and no mass. There is no tenderness.  Musculoskeletal: Normal range of motion. Exhibits no edema.  Lymphadenopathy:    No cervical adenopathy.  Neurological: Alert and oriented to person, place, and time. Exhibits normal muscle tone. Gait normal. Coordination normal.  Skin: Skin is  warm and dry. No rash noted. Not diaphoretic. No erythema. No pallor.  Psychiatric: Mood, memory and judgment normal  PERFORMANCE STATUS: ECOG 0  LABORATORY DATA: Lab Results  Component Value Date   WBC 10.3 03/30/2019   HGB 12.0 03/30/2019   HCT 38.6 03/30/2019   MCV 82.1  03/30/2019   PLT 405 (H) 03/30/2019      Chemistry      Component Value Date/Time   NA 137 03/30/2019 1138   K 3.8 03/30/2019 1138   CL 103 03/30/2019 1138   CO2 24 03/30/2019 1138   BUN 10 03/30/2019 1138   CREATININE 0.81 03/30/2019 1138      Component Value Date/Time   CALCIUM 9.2 03/30/2019 1138   ALKPHOS 72 03/08/2018 0228   AST 15 03/08/2018 0228   ALT 13 03/08/2018 0228   BILITOT 0.4 03/08/2018 0228       RADIOGRAPHIC STUDIES: Ct Abdomen Pelvis W Contrast  Result Date: 03/30/2019 CLINICAL DATA:  Right lower quadrant abdominal pain for several days, worsening. EXAM: CT ABDOMEN AND PELVIS WITH CONTRAST TECHNIQUE: Multidetector CT imaging of the abdomen and pelvis was performed using the standard protocol following bolus administration of intravenous contrast. CONTRAST:  OMNIPAQUE IOHEXOL 300 MG/ML  SOLN COMPARISON:  03/06/2018 CT abdomen/pelvis. FINDINGS: Lower chest: No significant pulmonary nodules or acute consolidative airspace disease. Hepatobiliary: Diffuse hepatic steatosis. No definite liver surface irregularity. No liver masses. Cholecystectomy. No biliary ductal dilatation. Pancreas: Normal, with no mass or duct dilation. Spleen: Normal size. No mass. Adrenals/Urinary Tract: Normal adrenals. Normal kidneys with no hydronephrosis and no renal mass. Normal bladder. Stomach/Bowel: Normal non-distended stomach. Normal caliber small bowel with no small bowel wall thickening. Normal appendix. Normal large bowel with no diverticulosis, large bowel wall thickening or pericolonic fat stranding. Vascular/Lymphatic: Normal caliber abdominal aorta. Patent portal, splenic, hepatic and renal veins. Mild right mesenteric adenopathy up to 1.6 cm (series 2/image 50), increased from 1.0 cm on 03/05/2018. No additional pathologically enlarged lymph nodes in the abdomen or pelvis. Reproductive: Grossly normal uterus.  No adnexal mass. Other: No pneumoperitoneum, ascites or focal fluid  collection. Musculoskeletal: No aggressive appearing focal osseous lesions. IMPRESSION: 1. No acute abnormality. No evidence of bowel obstruction or acute bowel inflammation. Normal appendix. 2. Nonspecific mild right mesenteric lymphadenopathy is mildly increased since 03/05/2018 CT. Consider heme-onc consultation for further evaluation. 3. Diffuse hepatic steatosis. Electronically Signed   By: Delbert Phenix M.D.   On: 03/30/2019 13:41    ASSESSMENT: This is a very pleasant 33 year old female who was referred to the clinic for evaluation of a 1.6 cm right mesenteric lymph node.   PLAN: Dr. Shirline Frees had a lengthy discussion patient today about her current condition and treatment options. The patient's most recent labs including a CBC and CMP were unremarkable except for a mildly increased platelet count.   Dr. Arbutus Ped personally and independently reviewed the CT scan of the  abdomen and pelvis that was performed on 03/05/2018 as well as 03/30/2019. Dr. Arbutus Ped believes this is likely reactive to an inflammary or infectious process and recommends that the patient continue on observation with a repeat CT of the abdomen and pelvis in 6 months to assess for resolution or change in the mesenteric lymph node. We will discuss further recommendations at her follow up appointment pending the scan results.   We will see the patient back for a follow up visit in 6 months with a repeat CT scan of the abdomen and pelvis as well  as repeat lab work with a CBC, CMP, and LDH.   The patient voices understanding of current disease status and treatment options and is in agreement with the current care plan.  All questions were answered. The patient knows to call the clinic with any problems, questions or concerns. We can certainly see the patient much sooner if necessary.  Thank you so much for allowing me to participate in the care of Glenna Fellows. I will continue to follow up the patient with you and assist in her  care.  I spent 40 minutes counseling the patient face to face. The total time spent in the appointment was 60 minutes.  Disclaimer: This note was dictated with voice recognition software. Similar sounding words can inadvertently be transcribed and may not be corrected upon review.   Tel Hevia L Ieshia Hatcher April 22, 2019, 3:26 PM  ADDENDUM: Hematology/Oncology Attending: I had a face-to-face encounter with the patient.  I recommended her care plan. This is a very pleasant 33 years old white female with moderate obesity as well as history of C-section, cholecystectomy as well as tubal ligation.  The patient has no smoking history.  She was seen at the emergency department recently complaining of urinary frequency, dysuria as well as right flank and abdominal pain. During her evaluation she had CT scan of the abdomen pelvis performed on 03/30/2019 and that showed no acute abnormality and no evidence of bowel obstruction or acute bowel inflammatory process.  There was nonspecific mild right mesenteric lymphadenopathy is at mildly increased from previous scan a year before and the lymph node measured 1.6 cm on the current scan. I personally and independently reviewed the scan images and discussed the results with the patient. This is likely inflammatory in origin but underlying lymphoproliferative disorder could not be completely excluded. Her recent blood work is unremarkable. I recommended for the patient to continue on observation with repeat CT scan of the abdomen and pelvis in 6 months to rule out any progression of the lymphadenopathy. The patient was also advised to call immediately if she developed any other symptoms or other palpable lymphadenopathy in the interval.   Disclaimer: This note was dictated with voice recognition software. Similar sounding words can inadvertently be transcribed and may be missed upon review.  Eilleen Kempf, MD 04/22/19

## 2019-04-24 ENCOUNTER — Telehealth: Payer: Self-pay | Admitting: Internal Medicine

## 2019-04-24 NOTE — Telephone Encounter (Signed)
Scheduled appt per 9/21 los - pt aware of appt date and time

## 2019-10-21 ENCOUNTER — Telehealth: Payer: Self-pay | Admitting: Internal Medicine

## 2019-10-21 ENCOUNTER — Inpatient Hospital Stay: Payer: Medicaid Other

## 2019-10-21 ENCOUNTER — Ambulatory Visit (HOSPITAL_COMMUNITY): Admission: RE | Admit: 2019-10-21 | Payer: Medicaid Other | Source: Ambulatory Visit

## 2019-10-21 NOTE — Telephone Encounter (Signed)
R/s appt per 3/ 22 sch message - unable to reach pt  .left message with new appt date and time

## 2019-10-28 ENCOUNTER — Ambulatory Visit: Payer: Medicaid Other | Admitting: Internal Medicine

## 2019-10-29 ENCOUNTER — Ambulatory Visit (HOSPITAL_COMMUNITY): Admission: RE | Admit: 2019-10-29 | Payer: Medicaid Other | Source: Ambulatory Visit

## 2019-10-29 ENCOUNTER — Inpatient Hospital Stay: Payer: Medicaid Other | Attending: Internal Medicine

## 2019-10-31 ENCOUNTER — Telehealth: Payer: Self-pay | Admitting: Medical Oncology

## 2019-10-31 ENCOUNTER — Inpatient Hospital Stay: Payer: Medicaid Other | Admitting: Internal Medicine

## 2019-10-31 NOTE — Telephone Encounter (Signed)
Missed appt today -car broke down.  Schedule message sent for labs and f/u the patient said she will call [facility] scheduling to schedule CT scan and needs waterbased contrast. Schedule message sent to schedule labs and f/u/

## 2019-11-08 ENCOUNTER — Telehealth: Payer: Self-pay | Admitting: Internal Medicine

## 2019-11-08 NOTE — Telephone Encounter (Signed)
Called pt to confirm appts for next week . No answer left message with apt dates and times

## 2019-11-12 ENCOUNTER — Ambulatory Visit (HOSPITAL_COMMUNITY): Admission: RE | Admit: 2019-11-12 | Payer: Medicaid Other | Source: Ambulatory Visit

## 2019-11-12 ENCOUNTER — Other Ambulatory Visit: Payer: Medicaid Other

## 2019-11-14 ENCOUNTER — Inpatient Hospital Stay: Payer: Medicaid Other | Attending: Internal Medicine | Admitting: Internal Medicine

## 2020-05-03 ENCOUNTER — Inpatient Hospital Stay (HOSPITAL_COMMUNITY)
Admission: AD | Admit: 2020-05-03 | Discharge: 2020-05-03 | Disposition: A | Discharge status: Home / Self Care | Payer: Medicaid Other | Attending: Obstetrics & Gynecology | Admitting: Obstetrics & Gynecology

## 2020-05-03 ENCOUNTER — Other Ambulatory Visit: Payer: Self-pay

## 2020-05-03 DIAGNOSIS — Z3202 Encounter for pregnancy test, result negative: Secondary | ICD-10-CM | POA: Insufficient documentation

## 2020-05-03 DIAGNOSIS — R102 Pelvic and perineal pain: Secondary | ICD-10-CM

## 2020-05-03 LAB — POCT PREGNANCY, URINE: Preg Test, Ur: NEGATIVE

## 2020-05-03 LAB — HCG, QUANTITATIVE, PREGNANCY: hCG, Beta Chain, Quant, S: <1 m[IU]/mL (ref <5)

## 2020-05-03 NOTE — MAU Provider Note (Signed)
  S Ms. Deborah Mcbride is a 34 y.o. No obstetric history on file. non-pregnant female who presents to MAU today with complaint of +UPT at home x3. UPT negative in MAU. Patient denies any severe pain or bleeding and reports mild cramping.  O BP 135/64 (BP Location: Right Arm)   Temp 98.8 F (37.1 C) (Oral)   Resp 16   Ht 5\' 3"  (1.6 m)   BMI 61.68 kg/m    Patient Vitals for the past 24 hrs:  BP Temp Temp src Resp Height  05/03/20 2219 135/64 98.8 F (37.1 C) Oral 16 5\' 3"  (1.6 m)    Physical Exam Constitutional:      General: She is not in acute distress.    Appearance: Normal appearance. She is not ill-appearing, toxic-appearing or diaphoretic.  HENT:     Head: Normocephalic and atraumatic.  Pulmonary:     Effort: Pulmonary effort is normal.  Neurological:     Mental Status: She is alert and oriented to person, place, and time.  Psychiatric:        Mood and Affect: Mood normal.        Behavior: Behavior normal.        Thought Content: Thought content normal.        Judgment: Judgment normal.    A Non pregnant female Medical screening exam complete UPT negative HCG obtained, will call patient with results  P Discharge from MAU in stable condition List of options for follow-up given  Warning signs for worsening condition that would warrant emergency follow-up discussed Patient may return to MAU as needed for pregnancy related complaints Called patient to discuss hCG results at 1214AM 05/04/2020 and patient identified by two identifiers. Relayed non-pregnant hCG status. Patient verbalized understanding and questions answered to patient satisfaction.  Tawnie Ehresman, , NP 05/04/2020 12:14 AM

## 2020-05-03 NOTE — MAU Note (Signed)
Pt reports she had a tubal ligation 11 years ago and had 3 positive home pregnancy test. Pt also reports 1/10 abdominal cramping that began tonight. Denies vaginal bleeding or abnormal vaginal discharge.

## 2020-05-03 NOTE — Discharge Instructions (Signed)
There are many options for quick evaluation and management of certain GYN issues that do not require a long wait time or big bill from the emergency department. Consider these options for your care in the future:   Walk-ins for certain complaints available at:   [facility] [address] [phone] See hours at [email or web address]  [facility] [employer] [facility] [employer] [facility]  [address]  [phone]  [facility] [employer] at [employer] [phone]  You can make an appointment to see a GYN provider:   [facility] for [facility] at [facility]  [address] [phone]  [facility] for [facility] Healthcare at [facility] [address] [employer] [phone]  Center for [facility] at [facility] [phone]  [facility] for [facility] Healthcare at [facility] [address], [address] [phone]  If you already have an established OB/GYN provider in the area you can make an appointment with them as well.      Prenatal Care Providers           [facility] for [facility] @ [facility] - accepts patients without insurance  Phone: [phone]  [facility] for [facility] @ [employer]   Phone: [phone]  [facility] @[place]       Phone: [phone]            [facility] for [facility] @ [place]     Phone: [phone]          [facility] for [facility] @ [facility]   Phone: [phone]  [facility] for [facility] @ [facility] - accepts patients without insurance  Phone: [phone]  [facility] for [facility] @ [facility] Phone: [phone]     [facility] Department - accepts patients without insurance Phone: [phone]  [facility]  Phone: [phone]  [phone] OB/GYN Phone: [phone]  [facility] Phone: [phone]  [facility] Phone: [phone]  [facility] Phone: [phone]  [facility]  Phone: [phone]

## 2020-08-21 ENCOUNTER — Other Ambulatory Visit: Payer: Self-pay

## 2020-08-21 ENCOUNTER — Encounter (HOSPITAL_COMMUNITY): Payer: Self-pay | Admitting: Emergency Medicine

## 2020-08-21 DIAGNOSIS — K0889 Other specified disorders of teeth and supporting structures: Secondary | ICD-10-CM | POA: Diagnosis present

## 2020-08-21 DIAGNOSIS — R030 Elevated blood-pressure reading, without diagnosis of hypertension: Secondary | ICD-10-CM | POA: Insufficient documentation

## 2020-08-21 DIAGNOSIS — K029 Dental caries, unspecified: Secondary | ICD-10-CM | POA: Diagnosis not present

## 2020-08-21 NOTE — ED Triage Notes (Signed)
Pt reports pain in her R lower back molar. Reports that she was seen at [facility] today and given pain medication and an antibiotic. States that nothing is helping the pain. Tearful in triage.

## 2020-08-22 ENCOUNTER — Emergency Department (HOSPITAL_COMMUNITY)
Admission: EM | Admit: 2020-08-22 | Discharge: 2020-08-22 | Disposition: A | Discharge status: Home / Self Care | Payer: Medicaid Other | Attending: Emergency Medicine | Admitting: Emergency Medicine

## 2020-08-22 DIAGNOSIS — K029 Dental caries, unspecified: Secondary | ICD-10-CM

## 2020-08-22 DIAGNOSIS — R030 Elevated blood-pressure reading, without diagnosis of hypertension: Secondary | ICD-10-CM

## 2020-08-22 MED ORDER — OXYCODONE-ACETAMINOPHEN 5-325 MG PO TABS
2.0000 | ORAL_TABLET | Freq: Once | ORAL | Status: AC
  Administered 2020-08-22: 2 via ORAL
  Filled 2020-08-22: qty 2

## 2020-08-22 MED ORDER — IBUPROFEN 200 MG PO TABS
400.0000 mg | ORAL_TABLET | Freq: Once | ORAL | Status: AC
  Administered 2020-08-22: 400 mg via ORAL
  Filled 2020-08-22: qty 2

## 2020-08-22 MED ORDER — LIDOCAINE VISCOUS HCL 2 % MT SOLN
15.0000 mL | Freq: Once | OROMUCOSAL | Status: AC
  Administered 2020-08-22: 15 mL via OROMUCOSAL
  Filled 2020-08-22: qty 15

## 2020-08-22 MED ORDER — OXYCODONE HCL 5 MG PO TABS
5.0000 mg | ORAL_TABLET | Freq: Once | ORAL | Status: AC
  Administered 2020-08-22: 5 mg via ORAL
  Filled 2020-08-22: qty 1

## 2020-08-22 MED ORDER — LIDOCAINE VISCOUS HCL 2 % MT SOLN: 15.0000 mL | OROMUCOSAL | 0 refills | Status: DC | PRN

## 2020-08-22 NOTE — ED Provider Notes (Signed)
[facility]-EMERGENCY DEPT Provider Note   CSN: [id number] Arrival date & time: 08/21/20  2315   History Chief Complaint  Patient presents with  . Dental Pain    Deborah Mcbride is a 35 y.o. female.  The history is provided by the patient.  Dental Pain She has no significant past history and comes in complaining of a painful right lower tooth.  Pain started yesterday and is severe.  She rates pain at 10/10.  Nothing makes it better, any type of pressure makes it worse.  She has tried taking ibuprofen, acetaminophen.  She has tried applying Orajel and using hydrogen peroxide.  None of these have helped at all.  She went to an urgent care center and was given prescriptions for clindamycin and hydrocodone-acetaminophen.  She has taken these but states that she is not getting any relief whatsoever.  She does have an appointment with her dentist next week.  Past Medical History:  Diagnosis Date  . Obesity     There are no problems to display for this patient.   Past Surgical History:  Procedure Laterality Date  . CESAREAN SECTION    . CHOLECYSTECTOMY    . TONSILLECTOMY    . TUBAL LIGATION       OB History   No obstetric history on file.     History reviewed. No pertinent family history.  Social History   Tobacco Use  . Smoking status: Never Smoker  . Smokeless tobacco: Never Used  Substance Use Topics  . Alcohol use: No  . Drug use: No    Home Medications Prior to Admission medications   Not on File    Allergies    Fentanyl and Dilaudid [hydromorphone hcl]  Review of Systems   Review of Systems  All other systems reviewed and are negative.   Physical Exam Updated Vital Signs BP (!) 167/89 (BP Location: Left Arm)   Pulse (!) 104   Temp 98.5 F (36.9 C) (Oral)   Resp 18   Ht 5\' 4"  (1.626 m)   Wt (!) 148.3 kg   SpO2 98%   BMI 56.13 kg/m   Physical Exam Vitals and nursing note reviewed.   Morbidly obese 35 year old female,  appears uncomfortable, but is in no acute distress. Vital signs are significant for elevated blood pressure and borderline elevated heart rate. Oxygen saturation is 98%, which is normal. Head is normocephalic and atraumatic. PERRLA, EOMI. Tooth #32 is carious and markedly tender.  There is no gingival swelling or pallor.  Remainder of dentition appears to be normal. Neck is nontender and supple without adenopathy or JVD. Back is nontender and there is no CVA tenderness. Lungs are clear without rales, wheezes, or rhonchi. Chest is nontender. Heart has regular rate and rhythm without murmur. Abdomen is soft, flat, nontender without masses or hepatosplenomegaly and peristalsis is normoactive. Extremities have no cyanosis or edema, full range of motion is present. Skin is warm and dry without rash. Neurologic: Mental status is normal, cranial nerves are intact, there are no motor or sensory deficits.  ED Results / Procedures / Treatments    Procedures Procedures  Medications Ordered in ED Medications  oxyCODONE-acetaminophen (PERCOCET/ROXICET) 5-325 MG per tablet 2 tablet (2 tablets Oral Given 08/22/20 0052)  ibuprofen (ADVIL) tablet 400 mg (400 mg Oral Given 08/22/20 0051)  lidocaine (XYLOCAINE) 2 % viscous mouth solution 15 mL (15 mLs Mouth/Throat Given 08/22/20 0157)  oxyCODONE (Oxy IR/ROXICODONE) immediate release tablet 5 mg (5 mg  Oral Given 08/22/20 0157)    ED Course  I have reviewed the triage vital signs and the nursing notes.  MDM Rules/Calculators/A&P Dental pain which has not responded to outpatient medications to this point.  She states that she has not had any medication in the past 6 hours.  She is given dose of oxycodone-acetaminophen plus a dose of ibuprofen and will be observed for adequate pain relief.  Old records were reviewed confirming urgent care visit earlier today.  She did not get relief with above-noted medication.  She was given additional oxycodone and also oral  viscous lidocaine which did give her adequate relief.  She is discharged with a prescription for viscous lidocaine and is told to follow-up with her dentist as soon as possible.  Final Clinical Impression(s) / ED Diagnoses Final diagnoses:  Pain due to dental caries  Elevated blood-pressure reading without diagnosis of hypertension    Rx / DC Orders ED Discharge Orders         Ordered    lidocaine (XYLOCAINE) 2 % solution  As needed        08/22/20 0221           Dione Booze, MD 08/22/20 0230

## 2020-08-22 NOTE — Discharge Instructions (Signed)
The tooth that is bothering you is one of your wisdom teeth.  It will probably need to be extracted.  Please see your dentist as soon as possible.  Your dentist may want you to see an oral surgeon to have it extracted.  In the meantime, continue taking the pain medication which was prescribed for you, and add ibuprofen to get additional pain relief.

## 2021-04-18 ENCOUNTER — Emergency Department (HOSPITAL_COMMUNITY)
Admission: EM | Admit: 2021-04-18 | Discharge: 2021-04-18 | Disposition: A | Discharge status: Home / Self Care | Payer: Medicaid Other | Attending: Student | Admitting: Student

## 2021-04-18 ENCOUNTER — Emergency Department (HOSPITAL_COMMUNITY): Payer: Medicaid Other

## 2021-04-18 ENCOUNTER — Other Ambulatory Visit: Payer: Self-pay

## 2021-04-18 DIAGNOSIS — R519 Headache, unspecified: Secondary | ICD-10-CM | POA: Insufficient documentation

## 2021-04-18 DIAGNOSIS — G43809 Other migraine, not intractable, without status migrainosus: Secondary | ICD-10-CM | POA: Diagnosis not present

## 2021-04-18 DIAGNOSIS — R200 Anesthesia of skin: Secondary | ICD-10-CM | POA: Diagnosis not present

## 2021-04-18 DIAGNOSIS — R791 Abnormal coagulation profile: Secondary | ICD-10-CM | POA: Insufficient documentation

## 2021-04-18 DIAGNOSIS — R531 Weakness: Secondary | ICD-10-CM | POA: Insufficient documentation

## 2021-04-18 DIAGNOSIS — M25531 Pain in right wrist: Secondary | ICD-10-CM | POA: Insufficient documentation

## 2021-04-18 LAB — I-STAT CHEM 8, ED
BUN: 11 mg/dL (ref 6–20)
Calcium, Ion: 1.09 mmol/L — ABNORMAL LOW (ref 1.15–1.40)
Chloride: 105 mmol/L (ref 98–111)
Creatinine, Ser: 0.8 mg/dL (ref 0.44–1.00)
Glucose, Bld: 117 mg/dL — ABNORMAL HIGH (ref 70–99)
HCT: 42 % (ref 36.0–46.0)
Hemoglobin: 14.3 g/dL (ref 12.0–15.0)
Potassium: 3.8 mmol/L (ref 3.5–5.1)
Sodium: 137 mmol/L (ref 135–145)
TCO2: 23 mmol/L (ref 22–32)

## 2021-04-18 LAB — DIFFERENTIAL
Abs Immature Granulocytes: 0.1 10*3/uL — ABNORMAL HIGH (ref 0.00–0.07)
Basophils Absolute: 0.1 10*3/uL (ref 0.0–0.1)
Basophils Relative: 1 %
Eosinophils Absolute: 0.1 10*3/uL (ref 0.0–0.5)
Eosinophils Relative: 1 %
Immature Granulocytes: 1 %
Lymphocytes Relative: 22 %
Lymphs Abs: 2.4 10*3/uL (ref 0.7–4.0)
Monocytes Absolute: 0.6 10*3/uL (ref 0.1–1.0)
Monocytes Relative: 5 %
Neutro Abs: 7.7 10*3/uL (ref 1.7–7.7)
Neutrophils Relative %: 70 %

## 2021-04-18 LAB — COMPREHENSIVE METABOLIC PANEL
ALT: 13 U/L (ref 0–44)
AST: 20 U/L (ref 15–41)
Albumin: 3.7 g/dL (ref 3.5–5.0)
Alkaline Phosphatase: 69 U/L (ref 38–126)
Anion gap: 12 (ref 5–15)
BUN: 11 mg/dL (ref 6–20)
CO2: 20 mmol/L — ABNORMAL LOW (ref 22–32)
Calcium: 9.3 mg/dL (ref 8.9–10.3)
Chloride: 105 mmol/L (ref 98–111)
Creatinine, Ser: 0.82 mg/dL (ref 0.44–1.00)
GFR, Estimated: >60 mL/min (ref >60)
Glucose, Bld: 117 mg/dL — ABNORMAL HIGH (ref 70–99)
Potassium: 4.1 mmol/L (ref 3.5–5.1)
Sodium: 137 mmol/L (ref 135–145)
Total Bilirubin: 0.8 mg/dL (ref 0.3–1.2)
Total Protein: 6.9 g/dL (ref 6.5–8.1)

## 2021-04-18 LAB — CBC
HCT: 44 % (ref 36.0–46.0)
Hemoglobin: 13.7 g/dL (ref 12.0–15.0)
MCH: 26.1 pg (ref 26.0–34.0)
MCHC: 31.1 g/dL (ref 30.0–36.0)
MCV: 84 fL (ref 80.0–100.0)
Platelets: 376 10*3/uL (ref 150–400)
RBC: 5.24 MIL/uL — ABNORMAL HIGH (ref 3.87–5.11)
RDW: 15.5 % (ref 11.5–15.5)
WBC: 10.9 10*3/uL — ABNORMAL HIGH (ref 4.0–10.5)
nRBC: 0 % (ref 0.0–0.2)

## 2021-04-18 LAB — I-STAT BETA HCG BLOOD, ED (MC, WL, AP ONLY): I-stat hCG, quantitative: <5 m[IU]/mL (ref <5)

## 2021-04-18 LAB — PROTIME-INR
INR: 1 (ref 0.8–1.2)
Prothrombin Time: 13.2 seconds (ref 11.4–15.2)

## 2021-04-18 LAB — CBG MONITORING, ED: Glucose-Capillary: 125 mg/dL — ABNORMAL HIGH (ref 70–99)

## 2021-04-18 LAB — APTT: aPTT: <20 seconds — ABNORMAL LOW (ref 24–36)

## 2021-04-18 MED ORDER — MIDAZOLAM HCL 2 MG/2ML IJ SOLN
INTRAMUSCULAR | Status: AC
  Filled 2021-04-18: qty 2

## 2021-04-18 MED ORDER — LACTATED RINGERS IV BOLUS
1000.0000 mL | Freq: Once | INTRAVENOUS | Status: AC
  Administered 2021-04-18: 1000 mL via INTRAVENOUS

## 2021-04-18 MED ORDER — PROCHLORPERAZINE EDISYLATE 10 MG/2ML IJ SOLN
10.0000 mg | Freq: Once | INTRAMUSCULAR | Status: AC
  Administered 2021-04-18: 10 mg via INTRAVENOUS
  Filled 2021-04-18: qty 2

## 2021-04-18 MED ORDER — SODIUM CHLORIDE 0.9% FLUSH
3.0000 mL | Freq: Once | INTRAVENOUS | Status: AC
  Administered 2021-04-18: 3 mL via INTRAVENOUS

## 2021-04-18 MED ORDER — DIPHENHYDRAMINE HCL 50 MG/ML IJ SOLN
25.0000 mg | Freq: Once | INTRAMUSCULAR | Status: AC
  Administered 2021-04-18: 25 mg via INTRAVENOUS
  Filled 2021-04-18: qty 1

## 2021-04-18 NOTE — Discharge Instructions (Addendum)
You were seen in the emergency department for evaluation of weakness and headache.  Your work-up today reassuringly showed no signs of stroke and your symptoms resolved after giving a combination of medications we give for migraine headaches.  I am concerned that your presentation may be related to something called a complex migraine and it would be important to follow-up with your neurologist.  However if you have new or worsening headache, new or worsening weakness or numbness, please return the emergency department immediately.  This time you are safe for discharge.

## 2021-04-18 NOTE — ED Provider Notes (Addendum)
[facility] EMERGENCY DEPARTMENT Provider Note   CSN: [id number] Arrival date & time: 04/18/21  1558     History No chief complaint on file.   Deborah Mcbride is a 35 y.o. female with PMH obesity, cholecystectomy who presents the emergency department for evaluation of right-sided weakness and headache.  Patient presents as a stroke alert with last known well 1330.  History obtained from husband who states that they were at church when the patient had a occipital headache that gradually worsened and the patient had an episode where she was standing, but not answering questions and staring off into space.  She then returned to mental status baseline but had a worsening headache and right upper and lower extremity numbness and weakness.  She arrives to the emergency department alert and oriented answering questions appropriately with persistent upper and lower extremity weakness.  She endorses wrist pain on the right but denies trauma to the area.  No external signs of trauma.  HPI     Past Medical History:  Diagnosis Date  . Obesity     There are no problems to display for this patient.   Past Surgical History:  Procedure Laterality Date  . CESAREAN SECTION    . CHOLECYSTECTOMY    . TONSILLECTOMY    . TUBAL LIGATION       OB History   No obstetric history on file.     No family history on file.  Social History   Tobacco Use  . Smoking status: Never  . Smokeless tobacco: Never  Substance Use Topics  . Alcohol use: No  . Drug use: No    Home Medications Prior to Admission medications   Medication Sig Start Date End Date Taking? Authorizing Provider  lidocaine (XYLOCAINE) 2 % solution Use as directed 15 mLs in the mouth or throat as needed for mouth pain. 08/22/20   Dione Booze, MD    Allergies    Fentanyl and Dilaudid [hydromorphone hcl]  Review of Systems   Review of Systems  Constitutional:  Negative for chills and fever.  HENT:   Negative for ear pain and sore throat.   Eyes:  Negative for pain and visual disturbance.  Respiratory:  Negative for cough and shortness of breath.   Cardiovascular:  Negative for chest pain and palpitations.  Gastrointestinal:  Negative for abdominal pain and vomiting.  Genitourinary:  Negative for dysuria and hematuria.  Musculoskeletal:  Positive for arthralgias. Negative for back pain.  Skin:  Negative for color change and rash.  Neurological:  Positive for weakness, numbness and headaches. Negative for seizures, syncope and facial asymmetry.  All other systems reviewed and are negative.  Physical Exam Updated Vital Signs Wt (!) 154.2 kg   BMI 58.35 kg/m   Physical Exam Vitals and nursing note reviewed.  Constitutional:      General: She is not in acute distress.    Appearance: She is well-developed.  HENT:     Head: Normocephalic and atraumatic.  Eyes:     Conjunctiva/sclera: Conjunctivae normal.  Cardiovascular:     Rate and Rhythm: Normal rate and regular rhythm.     Heart sounds: No murmur heard. Pulmonary:     Effort: Pulmonary effort is normal. No respiratory distress.     Breath sounds: Normal breath sounds.  Abdominal:     Palpations: Abdomen is soft.     Tenderness: There is no abdominal tenderness.  Musculoskeletal:        General: Tenderness (R  wrist) present.     Cervical back: Neck supple.  Skin:    General: Skin is warm and dry.  Neurological:     Mental Status: She is alert.     Sensory: Sensory deficit (subjetive RUE) present.     Motor: Weakness (RLE 4/5, 5/5 RUE strenght, adverse to lifting R arm due to wrist pain) present.    ED Results / Procedures / Treatments   Labs (all labs ordered are listed, but only abnormal results are displayed) Labs Reviewed  CBC - Abnormal; Notable for the following components:      Result Value   WBC 10.9 (*)    RBC 5.24 (*)    All other components within normal limits  DIFFERENTIAL - Abnormal; Notable for  the following components:   Abs Immature Granulocytes 0.10 (*)    All other components within normal limits  I-STAT CHEM 8, ED - Abnormal; Notable for the following components:   Glucose, Bld 117 (*)    Calcium, Ion 1.09 (*)    All other components within normal limits  CBG MONITORING, ED - Abnormal; Notable for the following components:   Glucose-Capillary 125 (*)    All other components within normal limits  PROTIME-INR  APTT  COMPREHENSIVE METABOLIC PANEL  I-STAT BETA HCG BLOOD, ED ([facility], [facility], [facility] ONLY)    EKG None  Radiology CT HEAD CODE STROKE WO CONTRAST  Result Date: 04/18/2021 CLINICAL DATA:  Code stroke. EXAM: CT HEAD WITHOUT CONTRAST TECHNIQUE: Contiguous axial images were obtained from the base of the skull through the vertex without intravenous contrast. COMPARISON:  Brain MRI 11/10/2009, CT head 12/07/2010 FINDINGS: Brain: There is no evidence of acute intracranial hemorrhage, extra-axial fluid collection, or acute infarct. The ventricles are not enlarged. There is no mass lesion. There is no midline shift. Vascular: No hyperdense vessel or unexpected calcification. Skull: Normal. Negative for fracture or focal lesion. Sinuses/Orbits: There is a mucous retention cyst in the right maxillary sinus and mild mucosal thickening in the left maxillary sinus. The globes and orbits are unremarkable. Other: None. ASPECTS [facility] Stroke Program Early CT Score) - Ganglionic level infarction (caudate, lentiform nuclei, internal capsule, insula, M1-M3 cortex): 7 - Supraganglionic infarction (M4-M6 cortex): 3 Total score (0-10 with 10 being normal): 10 IMPRESSION: 1. No acute intracranial hemorrhage or infarct. 2. ASPECTS is 10 These results were paged via AMION at the time of interpretation on 04/18/2021 at 4:21 pm to provider Dr Thomasena Edis. Electronically Signed   By: Lesia Hausen M.D.   On: 04/18/2021 16:24    Procedures .Critical Care Performed by: Glendora Score, MD Authorized by: Glendora Score, MD   Critical care provider statement:    Critical care time (minutes):  30   Critical care was necessary to treat or prevent imminent or life-threatening deterioration of the following conditions: stroke alert.   Critical care was time spent personally by me on the following activities:  Discussions with consultants, evaluation of patient's response to treatment, examination of patient, ordering and performing treatments and interventions, ordering and review of laboratory studies, ordering and review of radiographic studies, pulse oximetry, re-evaluation of patient's condition, obtaining history from patient or surrogate and review of old charts   Medications Ordered in ED Medications  sodium chloride flush (NS) 0.9 % injection 3 mL (has no administration in time range)  midazolam (VERSED) 2 MG/2ML injection (has no administration in time range)  midazolam (VERSED) 2 MG/2ML injection (has no administration in time range)    ED  Course  I have reviewed the triage vital signs and the nursing notes.  Pertinent labs & imaging results that were available during my care of the patient were reviewed by me and considered in my medical decision making (see chart for details).    MDM Rules/Calculators/A&P                           Patient seen in the emergency department for evaluation of headache and right-sided weakness and concern for acute stroke.  On initial neurologic evaluation, patient is purposeful in the areas of previously described weakness and she does have some tenderness over the wrist on the right with some subjective numbness of the right upper extremity.  Strength 4-5 in the right lower extremity.  CT head negative for acute stroke.  Neurology not recommending additional imaging studies in favor of possible complex migraine.  Laboratory evaluation unremarkable.  Patient given migraine cocktail Compazine Benadryl leading to complete resolution of the headache, weakness and  numbness.  Patient able to ambulate without any difficulty here in the emergency department and she was discharged with neurology follow-up due to concern for complex migraine. Final Clinical Impression(s) / ED Diagnoses Final diagnoses:  None    Rx / DC Orders ED Discharge Orders     None        Jerrick Farve, MD 04/18/21 2318    Glendora Score, MD 04/18/21 2319

## 2021-04-18 NOTE — ED Notes (Signed)
Pt reports all neuro symptoms and HA have resolved after medications. Pt verbalized understanding of d/c instructions, meds and followup care. Denies questions. VSS, no distress noted. Steady gait to exit with husband.

## 2021-04-18 NOTE — ED Triage Notes (Signed)
Pt BIB EMS for new onset L headache and R arm and R leg weakness. Taken to CT on arrival, neurologist present on arrival. Rule out stroke vs complex migraine.

## 2021-04-18 NOTE — Consult Note (Signed)
Neurology Consult H&P  MARLISA CARIDI MR# [id number] 04/18/2021   CC: left sided headache and right sided weakness  History is obtained from: patient, husband, EMS and chart  HPI: Deborah Mcbride is a 35 y.o. female PMHx as reviewed below, migraine developed headache which started in the left occipital region around 1100 this morning. It was throbbing and slowly ramped up in intensity. By 1230 it was ~9-10/10 and had radiated anteriorly. She had associated nausea and photophobia. By 1330 she started to feel numbness in her right arm which spread down her right side into her right leg. She became anxious and started to cry and subsequently c/o right sided weakness.  LKW: 1100 tNK given: No inconsistent exam and low score IR Thrombectomy No Modified Rankin Scale: 0-Completely asymptomatic and back to baseline post- stroke NIHSS: 6 LOC Responsiveness 0 LOC Questions 0 LOC Commands 0 Horizontal eye movement 0 Visual field 0 Facial palsy 0 Motor arm - Right arm 3 (good strength to resistance in arm but  no effort/able to maintain her arm suspended at her side) Motor arm - Left arm 0 Motor leg - Right leg 3 (good strength but  no effort) Motor leg - Left leg 0 Limb ataxia 0 Sensory test 0 Language 0 Speech 0 Extinction and inattention 0  ROS: A complete ROS was performed and is negative except as noted in the HPI.   Past Medical History:  Diagnosis Date   Obesity     No family history on file. Mother and maternal grandmother had migraines.  Social History:  reports that she has never smoked. She has never used smokeless tobacco. She reports that she does not drink alcohol and does not use drugs.   Prior to Admission medications   Medication Sig Start Date End Date Taking? Authorizing Provider  lidocaine (XYLOCAINE) 2 % solution Use as directed 15 mLs in the mouth or throat as needed for mouth pain. 08/22/20   Dione Booze, MD   Exam: Current vital signs: BP 127/71 (BP  Location: Right Arm)   Pulse 74   Temp 98.2 F (36.8 C) (Oral)   Resp (!) 23   Ht 5\' 4"  (1.626 m)   Wt (!) 145.2 kg   SpO2 99%   BMI 54.95 kg/m   Physical Exam  Constitutional: Appears well-developed and well-nourished.  Psych: Affect appropriate to situation Eyes: No scleral injection HENT: No OP obstruction. Head: Normocephalic.  Cardiovascular: Normal rate and regular rhythm.  Respiratory: Effort normal, symmetric excursions bilaterally, no audible wheezing. GI: Soft.  No distension. There is no tenderness.  Skin: WDI  Neuro: Mental Status: She is tearful and crying.  Patient is awake, alert, oriented to person, place, year and situation. Patient is able to give a clear and coherent history. Speech fluent, intact comprehension and repetition. No signs of aphasia or neglect. Visual Fields are full. Pupils are equal, round, and reactive to light. EOMI without ptosis or diploplia.  Facial sensation is symmetric to temperature Facial movement is symmetric.  Hearing is intact to voice. Uvula midline and palate elevates symmetrically. Shoulder shrug is symmetric. Tongue is midline without atrophy or fasciculations.  Tone is normal. Bulk is normal. 5/5 strength was present in all four extremities. She had very good strength 5/5 to resistance to pulling and pushing in right arm but no effort when asked. She was able to maintain her right arm suspended at her side during transfer to CT and back to bed. Sensation is symmetric to light  touch and temperature in the arms and legs. Deep Tendon Reflexes: 2+ and symmetric in the biceps and patellae. Toes are downgoing bilaterally and she withdrew both legs during testing. FNF and HKS are intact bilaterally. Gait - Deferred  I have reviewed labs in epic and the pertinent results are: No pertinent labs available.  I have reviewed the images obtained: NCT head showed No acute intracranial hemorrhage or infarct ASPECTS  10.  Assessment: Deborah Mcbride is a 35 y.o. female PMHx migraine with left sided, throbbing headache which slowly ramped up subsequently followed by numbness then weakness. The she does not have vascular risk factors and headache with migrainous features progressed slowly with associated nausea. Her exam was inconsistent with obvious very good strength on testing by multiple examiners. She also complained of pain in her right arm. We will   Impression:  Migraine Acute stress reaction   Plan: Migraine cocktail and when she improves she may be discharged.   Electronically signed by:  Marisue Humble, MD Page: [phone] 04/18/2021, 5:18 PM

## 2021-04-18 NOTE — ED Notes (Addendum)
Neurologist and Dr Posey Rea canceling code stroke

## 2021-11-26 ENCOUNTER — Other Ambulatory Visit: Payer: Self-pay | Admitting: Sports Medicine

## 2021-11-26 DIAGNOSIS — M5126 Other intervertebral disc displacement, lumbar region: Secondary | ICD-10-CM

## 2021-12-10 ENCOUNTER — Other Ambulatory Visit: Payer: Medicaid Other

## 2022-03-11 ENCOUNTER — Encounter (HOSPITAL_BASED_OUTPATIENT_CLINIC_OR_DEPARTMENT_OTHER): Payer: Self-pay

## 2022-03-11 ENCOUNTER — Other Ambulatory Visit: Payer: Self-pay

## 2022-03-11 ENCOUNTER — Emergency Department (HOSPITAL_BASED_OUTPATIENT_CLINIC_OR_DEPARTMENT_OTHER)
Admission: EM | Admit: 2022-03-11 | Discharge: 2022-03-11 | Disposition: A | Discharge status: Home / Self Care | Payer: Commercial Managed Care - PPO | Attending: Emergency Medicine | Admitting: Emergency Medicine

## 2022-03-11 DIAGNOSIS — R5383 Other fatigue: Secondary | ICD-10-CM | POA: Diagnosis not present

## 2022-03-11 DIAGNOSIS — R350 Frequency of micturition: Secondary | ICD-10-CM | POA: Insufficient documentation

## 2022-03-11 LAB — URINALYSIS, MICROSCOPIC (REFLEX): RBC / HPF: NONE SEEN RBC/hpf (ref 0–5)

## 2022-03-11 LAB — CBC WITH DIFFERENTIAL/PLATELET
Abs Immature Granulocytes: 0.09 10*3/uL — ABNORMAL HIGH (ref 0.00–0.07)
Basophils Absolute: 0.1 10*3/uL (ref 0.0–0.1)
Basophils Relative: 1 %
Eosinophils Absolute: 0.1 10*3/uL (ref 0.0–0.5)
Eosinophils Relative: 1 %
HCT: 35.7 % — ABNORMAL LOW (ref 36.0–46.0)
Hemoglobin: 11.7 g/dL — ABNORMAL LOW (ref 12.0–15.0)
Immature Granulocytes: 1 %
Lymphocytes Relative: 20 %
Lymphs Abs: 2 10*3/uL (ref 0.7–4.0)
MCH: 25.5 pg — ABNORMAL LOW (ref 26.0–34.0)
MCHC: 32.8 g/dL (ref 30.0–36.0)
MCV: 77.8 fL — ABNORMAL LOW (ref 80.0–100.0)
Monocytes Absolute: 0.7 10*3/uL (ref 0.1–1.0)
Monocytes Relative: 6 %
Neutro Abs: 7.4 10*3/uL (ref 1.7–7.7)
Neutrophils Relative %: 71 %
Platelets: 414 10*3/uL — ABNORMAL HIGH (ref 150–400)
RBC: 4.59 MIL/uL (ref 3.87–5.11)
RDW: 16.3 % — ABNORMAL HIGH (ref 11.5–15.5)
WBC: 10.3 10*3/uL (ref 4.0–10.5)
nRBC: 0 % (ref 0.0–0.2)

## 2022-03-11 LAB — BASIC METABOLIC PANEL
Anion gap: 7 (ref 5–15)
BUN: 11 mg/dL (ref 6–20)
CO2: 24 mmol/L (ref 22–32)
Calcium: 8.7 mg/dL — ABNORMAL LOW (ref 8.9–10.3)
Chloride: 106 mmol/L (ref 98–111)
Creatinine, Ser: 0.76 mg/dL (ref 0.44–1.00)
GFR, Estimated: >60 mL/min (ref >60)
Glucose, Bld: 103 mg/dL — ABNORMAL HIGH (ref 70–99)
Potassium: 3.5 mmol/L (ref 3.5–5.1)
Sodium: 137 mmol/L (ref 135–145)

## 2022-03-11 LAB — URINALYSIS, ROUTINE W REFLEX MICROSCOPIC
Bilirubin Urine: NEGATIVE
Glucose, UA: NEGATIVE mg/dL
Hgb urine dipstick: NEGATIVE
Ketones, ur: NEGATIVE mg/dL
Nitrite: NEGATIVE
Protein, ur: NEGATIVE mg/dL
Specific Gravity, Urine: 1.02 (ref 1.005–1.030)
pH: 7 (ref 5.0–8.0)

## 2022-03-11 LAB — PREGNANCY, URINE: Preg Test, Ur: NEGATIVE

## 2022-03-11 LAB — CBG MONITORING, ED: Glucose-Capillary: 110 mg/dL — ABNORMAL HIGH (ref 70–99)

## 2022-03-11 MED ORDER — SODIUM CHLORIDE 0.9 % IV BOLUS
1000.0000 mL | Freq: Once | INTRAVENOUS | Status: AC
  Administered 2022-03-11: 1000 mL via INTRAVENOUS

## 2022-03-11 NOTE — ED Triage Notes (Signed)
Patient states over the last week she has had increased thirst, lethargy and increased urination. No diagnosis of DM

## 2022-03-11 NOTE — Discharge Instructions (Addendum)
You are seen today in the emergency department for fatigue, increased thirst and increased urination.  Your urine does not show any signs of infection, your blood work is also reassuringly normal.  As we discussed I did not check any hormone levels and you can have that done by your primary care outpatient.  Your EKG also shows a normal rhythm.  Please return to the ED for chest pain, loss of consciousness or new concerning symptoms otherwise follow-up with your primary next week for reevaluation.

## 2022-03-11 NOTE — ED Provider Notes (Signed)
[facility] EMERGENCY DEPARTMENT Provider Note   CSN: [id number] Arrival date & time: 03/11/22  1638     History  Chief Complaint  Patient presents with   Urinary Frequency   Fatigue    Deborah Mcbride is a 36 y.o. female.   Urinary Frequency     Patient with medical history of obesity, OSA on cpap, and GERD, status post evaluation, cholecystectomy, prior C-section presents today due to generalized fatigue and polydipsia and polyuria.  Patient symptoms started about a week ago, states she feels like she cannot drink enough fluids.  She is drinking a lot of water which is also causing increased urination.  She denies any dysuria or hematuria, not having any abdominal pain or flank pain or fevers or nausea or vomiting.  She denies any recent changes in medicine, she takes an antacid for GERD but no other medicine daily.   Home Medications Prior to Admission medications   Medication Sig Start Date End Date Taking? Authorizing Provider  lidocaine (XYLOCAINE) 2 % solution Use as directed 15 mLs in the mouth or throat as needed for mouth pain. Patient not taking: No sig reported 08/22/20   Dione Booze, MD      Allergies    Fentanyl and Dilaudid [hydromorphone hcl]    Review of Systems   Review of Systems  Genitourinary:  Positive for frequency.    Physical Exam Updated Vital Signs BP (!) 110/53   Pulse 75   Temp 98.5 F (36.9 C) (Oral)   Resp 18   Ht 5\' 4"  (1.626 m)   Wt (!) 145.2 kg   LMP 02/08/2022 (Approximate)   SpO2 98%   BMI 54.93 kg/m  Physical Exam Vitals and nursing note reviewed. Exam conducted with a chaperone present.  Constitutional:      Appearance: She is obese.     Comments: BMI 54.93  HENT:     Head: Normocephalic and atraumatic.  Eyes:     General: No scleral icterus.       Right eye: No discharge.        Left eye: No discharge.     Extraocular Movements: Extraocular movements intact.     Pupils: Pupils are equal, round, and  reactive to light.  Cardiovascular:     Rate and Rhythm: Normal rate and regular rhythm.     Pulses: Normal pulses.     Heart sounds: Normal heart sounds. No murmur heard.    No friction rub. No gallop.  Pulmonary:     Effort: Pulmonary effort is normal. No respiratory distress.     Breath sounds: Normal breath sounds.  Abdominal:     General: Abdomen is flat. Bowel sounds are normal. There is no distension.     Palpations: Abdomen is soft.     Tenderness: There is no abdominal tenderness.  Skin:    General: Skin is warm and dry.     Coloration: Skin is not jaundiced.  Neurological:     Mental Status: She is alert. Mental status is at baseline.     Coordination: Coordination normal.     ED Results / Procedures / Treatments   Labs (all labs ordered are listed, but only abnormal results are displayed) Labs Reviewed  URINALYSIS, ROUTINE W REFLEX MICROSCOPIC - Abnormal; Notable for the following components:      Result Value   Leukocytes,Ua SMALL (*)    All other components within normal limits  BASIC METABOLIC PANEL - Abnormal; Notable for the following  components:   Glucose, Bld 103 (*)    Calcium 8.7 (*)    All other components within normal limits  CBC WITH DIFFERENTIAL/PLATELET - Abnormal; Notable for the following components:   Hemoglobin 11.7 (*)    HCT 35.7 (*)    MCV 77.8 (*)    MCH 25.5 (*)    RDW 16.3 (*)    Platelets 414 (*)    Abs Immature Granulocytes 0.09 (*)    All other components within normal limits  URINALYSIS, MICROSCOPIC (REFLEX) - Abnormal; Notable for the following components:   Bacteria, UA MANY (*)    All other components within normal limits  CBG MONITORING, ED - Abnormal; Notable for the following components:   Glucose-Capillary 110 (*)    All other components within normal limits  URINE CULTURE  PREGNANCY, URINE    EKG None  Radiology No results found.  Procedures Procedures    Medications Ordered in ED Medications  sodium  chloride 0.9 % bolus 1,000 mL (0 mLs Intravenous Stopped 03/11/22 1856)    ED Course/ Medical Decision Making/ A&P                           Medical Decision Making Amount and/or Complexity of Data Reviewed Labs: ordered.   Patient with medical history of GERD presents today due to generalized fatigue, polyuria and polydipsia.  Differential includes new onset diabetes, DKA, AKI, UTI, pyelonephritis, electrolyte derangement, arrhythmia, anemia, ruptured ectopic pregnancy.  On exam patient is well-appearing, there is no abdominal tenderness and lungs are clear to auscultation bilaterally.  No CVA tenderness.  Regular rhythm with S1-S2.  Vital signs are reassuring without any fever, tachycardia or hypoxia.   I ordered and viewed laboratory work-up.  Per my interpretation no leukocytosis, stable anemia with a hemoglobin 11.8.  BMP without gross electrolyte derangement, mildly hyperglycemic at 103 but no AKI.  UA is without any signs of would be suggestive of UTI, patient is also not pregnant.    I ordered fluid bolus for the patient.   I ordered and viewed EKG.  No ischemic findings or underlying arrhythmia noted.  Patient's symptoms are generally vague, on exam she has no focal tenderness and is well-appearing.  Vital signs are also reassuringly stable.  Laboratory work-up is benign.  I do not think any emergent etiology is going on, we discussed outpatient follow-up with her PCP.  Return precautions discussed, discharged in stable condition.         Final Clinical Impression(s) / ED Diagnoses Final diagnoses:  Fatigue, unspecified type  Urinary frequency    Rx / DC Orders ED Discharge Orders     None         Theron Arista, Cordelia Poche 03/11/22 2056    Charlynne Pander, MD 03/11/22 2239

## 2022-03-11 NOTE — ED Notes (Signed)
Dc instructions reviewed with pt no questions or concerns at this time.  

## 2022-03-14 LAB — URINE CULTURE: Culture: 60000 — AB

## 2022-03-15 ENCOUNTER — Telehealth: Payer: Self-pay

## 2022-03-15 NOTE — Progress Notes (Signed)
ED Antimicrobial Stewardship Positive Culture Follow Up   Deborah Mcbride is an 36 y.o. female who presented to [facility] on 03/11/2022 with a chief complaint of  Chief Complaint  Patient presents with   Urinary Frequency   Fatigue    Recent Results (from the past 720 hour(s))  Urine Culture     Status: Abnormal   Collection Time: 03/11/22  5:03 PM   Specimen: Urine, Clean Catch  Result Value Ref Range Status   Specimen Description   Final    URINE, CLEAN CATCH Performed at [facility], [address] [place], [place] [address]    Special Requests   Final    NONE Performed at [facility], [address] [place], [place] [address]    Culture 60,000 COLONIES/mL ESCHERICHIA COLI (A)  Final   Report Status 03/14/2022 FINAL  Final   Organism ID, Bacteria ESCHERICHIA COLI (A)  Final      Susceptibility   Escherichia coli - MIC*    AMPICILLIN >=32 RESISTANT Resistant     CEFAZOLIN <=4 SENSITIVE Sensitive     CEFEPIME <=0.12 SENSITIVE Sensitive     CEFTRIAXONE <=0.25 SENSITIVE Sensitive     CIPROFLOXACIN <=0.25 SENSITIVE Sensitive     GENTAMICIN <=1 SENSITIVE Sensitive     IMIPENEM <=0.25 SENSITIVE Sensitive     NITROFURANTOIN <=16 SENSITIVE Sensitive     TRIMETH/SULFA <=20 SENSITIVE Sensitive     AMPICILLIN/SULBACTAM 16 INTERMEDIATE Intermediate     PIP/TAZO <=4 SENSITIVE Sensitive     * 60,000 COLONIES/mL ESCHERICHIA COLI    [x]  Patient discharged originally without antimicrobial agent and treatment is now indicated  New antibiotic prescription: Macrobid 100mg  BID for 5 days.   ED Provider: , PA  03/15/2022, 10:17 AM Clinical Pharmacist Monday - Friday phone -  [phone] Saturday - Sunday phone - [phone]

## 2022-03-15 NOTE — Telephone Encounter (Signed)
Post ED Visit - Positive Culture Follow-up: Successful Patient Follow-Up  Culture assessed and recommendations reviewed by:  [x]  , Pharm.D. []  Estill Batten, Pharm.D., BCPS AQ-ID []  , Pharm.D., BCPS []  Celedonio Miyamoto, Pharm.D., BCPS []  [place], Garvin Fila.D., BCPS, AAHIVP []  , Pharm.D., BCPS, AAHIVP []  Georgina Pillion, PharmD, BCPS []  , PharmD, BCPS []  [place], PharmD, BCPS []  [place], PharmD  Positive urine culture  [x]  Patient discharged without antimicrobial prescription and treatment is now indicated []  Organism is resistant to prescribed ED discharge antimicrobial []  Patient with positive blood cultures  Changes discussed with ED provider: , PA-C New antibiotic prescription Macrobid 100 mg po BID x 5 days Called to [facility] patient, date 03/15/2022, time 11:50 am   Lysle Pearl 03/15/2022, 11:50 AM

## 2022-07-07 ENCOUNTER — Encounter: Payer: Self-pay | Admitting: Internal Medicine

## 2022-07-07 ENCOUNTER — Ambulatory Visit (INDEPENDENT_AMBULATORY_CARE_PROVIDER_SITE_OTHER): Payer: BC Managed Care – PPO | Admitting: Internal Medicine

## 2022-07-07 DIAGNOSIS — K219 Gastro-esophageal reflux disease without esophagitis: Secondary | ICD-10-CM

## 2022-07-07 DIAGNOSIS — G4733 Obstructive sleep apnea (adult) (pediatric): Secondary | ICD-10-CM | POA: Diagnosis not present

## 2022-07-07 MED ORDER — WEGOVY 0.5 MG/0.5ML ~~LOC~~ SOAJ: 0.5000 mg | SUBCUTANEOUS | 1 refills | Status: DC

## 2022-07-07 NOTE — Patient Instructions (Signed)
She will continue to loose weight by watching her diet and exercise.

## 2022-07-07 NOTE — Progress Notes (Addendum)
   Established Patient Office Visit  Subjective   Patient ID: Deborah Mcbride, female    DOB: 10-13-1985  Age: 36 y.o. MRN: [id number]  Chief Complaint  Patient presents with   Weight Loss    Follow Up     HPI 36 years old female is here for weight loss, she is a morbidly obese female with BMI of 58, she was started on Wagovy and has lost 10 pounds since last visit.   She has OSA and use CPAP.   Review of Systems  Constitutional: Negative.   HENT: Negative.    Respiratory: Negative.    Cardiovascular: Negative.   Gastrointestinal: Negative.   Neurological: Negative.       Objective:     BP 124/78 (BP Location: Left Arm, Patient Position: Sitting, Cuff Size: Normal)   Pulse 76   Temp 98 F (36.7 C)   Resp 18   Ht 5\' 3"  (1.6 m)   Wt (!) 330 lb (149.7 kg)   SpO2 99%   BMI 58.46 kg/m    Physical Exam Constitutional:      Appearance: Normal appearance. She is obese.  HENT:     Head: Normocephalic.  Eyes:     Extraocular Movements: Extraocular movements intact.     Pupils: Pupils are equal, round, and reactive to light.  Pulmonary:     Effort: Pulmonary effort is normal.     Breath sounds: Normal breath sounds.  Abdominal:     General: Bowel sounds are normal.     Palpations: Abdomen is soft.  Neurological:     General: No focal deficit present.     Mental Status: She is alert and oriented to person, place, and time.  Psychiatric:        Mood and Affect: Mood normal.      No results found for any visits on 07/07/22.  The ASCVD Risk score (Arnett DK, et al., 2019) failed to calculate for the following reasons:   The 2019 ASCVD risk score is only valid for ages 39 to 62    Assessment & Plan:   Problem List Items Addressed This Visit       Respiratory   OSA treated with BiPAP     Digestive   Gastroesophageal reflux disease without esophagitis     Other   Morbid obesity (HCC) - Primary   Relevant Medications   Semaglutide-Weight Management  (WEGOVY) 0.5 MG/0.5ML SOAJ    Return in about 3 months (around 10/06/2022).    12/06/2022, MD

## 2022-08-03 ENCOUNTER — Encounter: Payer: Self-pay | Admitting: Internal Medicine

## 2022-08-03 ENCOUNTER — Ambulatory Visit: Payer: BC Managed Care – PPO | Admitting: Internal Medicine

## 2022-08-03 VITALS — BP 128/84 | HR 87 | Temp 97.9°F | Resp 18 | Ht 63.0 in | Wt 326.2 lb

## 2022-08-03 DIAGNOSIS — Z6841 Body Mass Index (BMI) 40.0 and over, adult: Secondary | ICD-10-CM

## 2022-08-03 MED ORDER — WEGOVY 1 MG/0.5ML ~~LOC~~ SOAJ: 1.0000 mg | SUBCUTANEOUS | 0 refills | Status: DC

## 2022-08-03 MED ORDER — WEGOVY 1.7 MG/0.75ML ~~LOC~~ SOAJ: 1.7000 mg | SUBCUTANEOUS | 1 refills | Status: DC

## 2022-08-03 NOTE — Assessment & Plan Note (Signed)
I want her to exercise more and eat healthy.  I am going to increase her wegovy to 1mg  subcut weekly x 1 month, then go to 1.7mg  subcut weekly.  We will see her back in 3 months.

## 2022-08-03 NOTE — Progress Notes (Signed)
   Office Visit  Subjective   Patient ID: Deborah Mcbride   DOB: [birth year]   Age: 37 y.o.   MRN: [id number]   Chief Complaint Chief Complaint  Patient presents with   Follow-up     History of Present Illness Deborah Mcbride is a 37 yo female who comes in today to discuss weight loss.  She saw my NP in 06/2022 at the [facility] office where the patient states she weighed 350 lbs at that time.  She was seen back in our office after starting wegovy where on 07/07/2022 she weighed 330 lbs.  She was started on wegovy 0.25mg  subcut weekly and was increased to 0.5mg  subcut on 07/07/2022.  The patient is not exercising.  She is trying to eat smaller portions and more healthy foods.  There has been some diarrhea but this is mild.  She has been tried on adipex and topimate in the past.       Past Medical History Past Medical History:  Diagnosis Date   Obesity      Allergies Allergies  Allergen Reactions   Fentanyl Anaphylaxis   Dilaudid [Hydromorphone Hcl] Itching     Review of Systems Review of Systems  Constitutional:  Negative for chills and fever.  Eyes:  Negative for blurred vision and double vision.  Respiratory:  Negative for cough and shortness of breath.   Cardiovascular:  Negative for chest pain, orthopnea and leg swelling.  Gastrointestinal:  Positive for diarrhea. Negative for abdominal pain, constipation, nausea and vomiting.  Musculoskeletal:  Negative for myalgias.  Skin:  Negative for itching and rash.  Neurological:  Negative for dizziness, weakness and headaches.       Objective:    Vitals BP 128/84 (BP Location: Right Arm, Patient Position: Sitting, Cuff Size: Large)   Pulse 87   Temp 97.9 F (36.6 C) (Temporal)   Resp 18   Ht 5\' 3"  (1.6 m)   Wt (!) 326 lb 3.2 oz (148 kg)   SpO2 99%   BMI 57.78 kg/m    Physical Examination Physical Exam Constitutional:      Appearance: Normal appearance. She is obese.  Cardiovascular:     Rate and Rhythm: Normal rate  and regular rhythm.     Pulses: Normal pulses.     Heart sounds: No murmur heard.    No friction rub. No gallop.  Pulmonary:     Effort: Pulmonary effort is normal. No respiratory distress.     Breath sounds: No wheezing, rhonchi or rales.  Abdominal:     General: Abdomen is flat. Bowel sounds are normal. There is no distension.     Palpations: Abdomen is soft.     Tenderness: There is no abdominal tenderness.  Musculoskeletal:     Right lower leg: No edema.     Left lower leg: No edema.  Skin:    General: Skin is warm and dry.     Findings: No rash.  Neurological:     Mental Status: She is alert.        Assessment & Plan:   Morbid obesity ([place]) I want her to exercise more and eat healthy.  I am going to increase her wegovy to 1mg  subcut weekly x 1 month, then go to 1.7mg  subcut weekly.  We will see her back in 3 months.  BMI 50.0-59.9, adult ([place]) As Above.    Return in about 3 months (around 11/02/2022).   Deborah Roger, MD

## 2022-08-03 NOTE — Assessment & Plan Note (Signed)
-  As Above ?

## 2022-08-08 ENCOUNTER — Other Ambulatory Visit: Payer: Self-pay

## 2022-08-08 ENCOUNTER — Emergency Department (HOSPITAL_COMMUNITY)
Admission: EM | Admit: 2022-08-08 | Discharge: 2022-08-08 | Disposition: A | Discharge status: Home / Self Care | Payer: BC Managed Care – PPO | Attending: Emergency Medicine | Admitting: Emergency Medicine

## 2022-08-08 ENCOUNTER — Encounter (HOSPITAL_COMMUNITY): Payer: Self-pay

## 2022-08-08 ENCOUNTER — Emergency Department (HOSPITAL_COMMUNITY): Payer: BC Managed Care – PPO

## 2022-08-08 DIAGNOSIS — I10 Essential (primary) hypertension: Secondary | ICD-10-CM | POA: Diagnosis not present

## 2022-08-08 DIAGNOSIS — R160 Hepatomegaly, not elsewhere classified: Secondary | ICD-10-CM | POA: Diagnosis not present

## 2022-08-08 DIAGNOSIS — R58 Hemorrhage, not elsewhere classified: Secondary | ICD-10-CM | POA: Diagnosis not present

## 2022-08-08 DIAGNOSIS — N83201 Unspecified ovarian cyst, right side: Secondary | ICD-10-CM | POA: Diagnosis not present

## 2022-08-08 DIAGNOSIS — R109 Unspecified abdominal pain: Secondary | ICD-10-CM | POA: Diagnosis not present

## 2022-08-08 DIAGNOSIS — R1031 Right lower quadrant pain: Secondary | ICD-10-CM | POA: Diagnosis not present

## 2022-08-08 DIAGNOSIS — K760 Fatty (change of) liver, not elsewhere classified: Secondary | ICD-10-CM | POA: Diagnosis not present

## 2022-08-08 DIAGNOSIS — N838 Other noninflammatory disorders of ovary, fallopian tube and broad ligament: Secondary | ICD-10-CM | POA: Diagnosis not present

## 2022-08-08 LAB — CBC WITH DIFFERENTIAL/PLATELET
Abs Immature Granulocytes: 0.08 10*3/uL — ABNORMAL HIGH (ref 0.00–0.07)
Basophils Absolute: 0.1 10*3/uL (ref 0.0–0.1)
Basophils Relative: 1 %
Eosinophils Absolute: 0.1 10*3/uL (ref 0.0–0.5)
Eosinophils Relative: 2 %
HCT: 38 % (ref 36.0–46.0)
Hemoglobin: 12.2 g/dL (ref 12.0–15.0)
Immature Granulocytes: 1 %
Lymphocytes Relative: 18 %
Lymphs Abs: 1.6 10*3/uL (ref 0.7–4.0)
MCH: 25.3 pg — ABNORMAL LOW (ref 26.0–34.0)
MCHC: 32.1 g/dL (ref 30.0–36.0)
MCV: 78.8 fL — ABNORMAL LOW (ref 80.0–100.0)
Monocytes Absolute: 0.5 10*3/uL (ref 0.1–1.0)
Monocytes Relative: 5 %
Neutro Abs: 6.5 10*3/uL (ref 1.7–7.7)
Neutrophils Relative %: 73 %
Platelets: 445 10*3/uL — ABNORMAL HIGH (ref 150–400)
RBC: 4.82 MIL/uL (ref 3.87–5.11)
RDW: 16.5 % — ABNORMAL HIGH (ref 11.5–15.5)
WBC: 8.8 10*3/uL (ref 4.0–10.5)
nRBC: 0 % (ref 0.0–0.2)

## 2022-08-08 LAB — URINALYSIS, ROUTINE W REFLEX MICROSCOPIC: RBC / HPF: >50 RBC/hpf — ABNORMAL HIGH (ref 0–5)

## 2022-08-08 LAB — COMPREHENSIVE METABOLIC PANEL
ALT: 17 U/L (ref 0–44)
AST: 19 U/L (ref 15–41)
Albumin: 4.3 g/dL (ref 3.5–5.0)
Alkaline Phosphatase: 76 U/L (ref 38–126)
Anion gap: 8 (ref 5–15)
BUN: 11 mg/dL (ref 6–20)
CO2: 22 mmol/L (ref 22–32)
Calcium: 9 mg/dL (ref 8.9–10.3)
Chloride: 106 mmol/L (ref 98–111)
Creatinine, Ser: 0.88 mg/dL (ref 0.44–1.00)
GFR, Estimated: >60 mL/min (ref >60)
Glucose, Bld: 94 mg/dL (ref 70–99)
Potassium: 3.7 mmol/L (ref 3.5–5.1)
Sodium: 136 mmol/L (ref 135–145)
Total Bilirubin: 0.3 mg/dL (ref 0.3–1.2)
Total Protein: 7.5 g/dL (ref 6.5–8.1)

## 2022-08-08 LAB — PREGNANCY, URINE: Preg Test, Ur: NEGATIVE

## 2022-08-08 LAB — LIPASE, BLOOD: Lipase: 24 U/L (ref 11–51)

## 2022-08-08 MED ORDER — OXYCODONE-ACETAMINOPHEN 5-325 MG PO TABS
1.0000 | ORAL_TABLET | Freq: Once | ORAL | Status: AC
  Administered 2022-08-08: 1 via ORAL
  Filled 2022-08-08: qty 1

## 2022-08-08 MED ORDER — ONDANSETRON HCL 4 MG/2ML IJ SOLN
4.0000 mg | Freq: Once | INTRAMUSCULAR | Status: AC
  Administered 2022-08-08: 4 mg via INTRAVENOUS
  Filled 2022-08-08: qty 2

## 2022-08-08 MED ORDER — SODIUM CHLORIDE 0.9 % IV BOLUS
1000.0000 mL | Freq: Once | INTRAVENOUS | Status: AC
  Administered 2022-08-08: 1000 mL via INTRAVENOUS

## 2022-08-08 MED ORDER — ONDANSETRON 4 MG PO TBDP: 4.0000 mg | ORAL_TABLET | Freq: Three times a day (TID) | ORAL | 0 refills | Status: DC | PRN

## 2022-08-08 MED ORDER — OXYCODONE-ACETAMINOPHEN 5-325 MG PO TABS: 1.0000 | ORAL_TABLET | Freq: Four times a day (QID) | ORAL | 0 refills | Status: DC | PRN

## 2022-08-08 MED ORDER — MORPHINE SULFATE (PF) 4 MG/ML IV SOLN
4.0000 mg | Freq: Once | INTRAVENOUS | Status: AC
  Administered 2022-08-08: 4 mg via INTRAVENOUS
  Filled 2022-08-08: qty 1

## 2022-08-08 NOTE — ED Notes (Signed)
US at bedside

## 2022-08-08 NOTE — ED Provider Notes (Cosign Needed Addendum)
Patient handed off from [place], [place]. Plan at handoff was to wait for US pelvic and transvaginal results with likely discharge if no indication of ovarian torsion and follow up with OBGYN.  Physical Exam  BP 117/74   Pulse 66   Temp 97.9 F (36.6 C) (Oral)   Resp 18   SpO2 99%   Physical Exam Vitals and nursing note reviewed.  Constitutional:      Appearance: Normal appearance. She is obese.  HENT:     Head: Normocephalic and atraumatic.     Nose: Nose normal.  Eyes:     Conjunctiva/sclera: Conjunctivae normal.  Cardiovascular:     Rate and Rhythm: Normal rate and regular rhythm.     Pulses: Normal pulses.     Heart sounds: Normal heart sounds.  Pulmonary:     Effort: Pulmonary effort is normal.     Breath sounds: Normal breath sounds.  Abdominal:     Palpations: There is no mass.     Tenderness: There is abdominal tenderness. There is guarding.     Comments: RLQ pain on light palpation  Musculoskeletal:        General: Normal range of motion.  Skin:    General: Skin is warm and dry.  Neurological:     Mental Status: She is alert.     Procedures  Procedures  ED Course / MDM    Medical Decision Making Amount and/or Complexity of Data Reviewed Labs: ordered. Radiology: ordered.  Risk Prescription drug management.   Patient was handed off by Lurena Nida, PA-C with plan to wait for results of [place]. [place] was negative for signs of ovarian torsion, but did not a likely hemorrhagic cyst or possible endometrioma on the right ovary measuring 4.9cm. Advised patient that she should follow up with an OBGYN for reevaluation and for repeat US in 6-8 weeks to reassess status of cyst at that time. Asymptomatic bacteruria noted without indication for antibiotic therapy. Patient was agreeable to discharge home with pain and nausea medications. Patient did not have further questions at the end of the encounter.      Luvenia Heller, PA-C 08/08/22 1810    Pattricia Boss,  MD 08/09/22 1150

## 2022-08-08 NOTE — ED Triage Notes (Signed)
BIBA from home for right flank with n/v x3 days.  Currently on menstrual.  Advil @ 0700 w/o relief.

## 2022-08-08 NOTE — Discharge Instructions (Addendum)
You were seen in the emergency department for abdominal pain which appears to be an ovarian cyst seen on ultrasound. You should plan on following up with an OBGYN for reevaluation for particularly if symptoms are worsening and for repeat ultrasound in the next 6-8 weeks to assess status of the cyst.

## 2022-08-08 NOTE — ED Provider Notes (Signed)
[facility]-EMERGENCY DEPT Provider Note   CSN: [id number] Arrival date & time: 08/08/22  0845     History  Chief Complaint  Patient presents with   Flank Pain   *Husband is present to assist in history  Deborah Mcbride is a 37 y.o. female presented for abdominal pain that began this morning.  Pain is 10 out of 10 sharp  constant on the right abdominal/right flank radiates straight through to the back. Patient has tried 200 mg ibuprofen but that did not relieve pain. Patient denied any comfortable position and stated patient is present no matter how she lies. Patient endorsed non-bloody nausea this morning.  Patient is currently on her period and cannot say whether or not she has had any hematuria.  Patient denied fever, CP, shortness of breath, diarrhea   Home Medications Prior to Admission medications   Medication Sig Start Date End Date Taking? Authorizing Provider  lidocaine (XYLOCAINE) 2 % solution Use as directed 15 mLs in the mouth or throat as needed for mouth pain. 08/22/20   Dione Booze, MD  omeprazole (PRILOSEC) 40 MG capsule Take 40 mg by mouth daily. 07/12/21   [provider]  Semaglutide-Weight Management (WEGOVY) 0.5 MG/0.5ML SOAJ Inject 0.5 mg into the skin once a week. 07/07/22   Eloisa Northern, MD  Semaglutide-Weight Management [facility]) 1 MG/0.5ML SOAJ Inject 1 mg into the skin once a week. 08/03/22   Crist Fat, MD  Semaglutide-Weight Management [facility]) 1.7 MG/0.75ML SOAJ Inject 1.7 mg into the skin once a week. 09/02/22   Crist Fat, MD      Allergies    Fentanyl and Dilaudid [hydromorphone hcl]    Review of Systems   Review of Systems  Genitourinary:  Positive for flank pain.    Physical Exam Updated Vital Signs BP (!) 118/57   Pulse (!) 108   Temp 98.6 F (37 C) (Oral)   Resp 18   SpO2 98%  Physical Exam Constitutional:      General: She is in acute distress.  HENT:     Head:     Comments: Face: flushed Eyes:      Extraocular Movements: Extraocular movements intact.     Conjunctiva/sclera: Conjunctivae normal.     Pupils: Pupils are equal, round, and reactive to light.  Cardiovascular:     Rate and Rhythm: Normal rate and regular rhythm.     Pulses: Normal pulses.     Heart sounds: Normal heart sounds.  Pulmonary:     Effort: Pulmonary effort is normal.     Breath sounds: Normal breath sounds.  Abdominal:     Palpations: Abdomen is soft.     Tenderness: There is abdominal tenderness. There is right CVA tenderness and guarding.  Musculoskeletal:        General: Normal range of motion.     Cervical back: Normal range of motion.  Skin:    General: Skin is warm and dry.     Capillary Refill: Capillary refill takes less than 2 seconds.  Neurological:     General: No focal deficit present.     Mental Status: She is alert and oriented to person, place, and time.  Psychiatric:        Mood and Affect: Mood normal.     ED Results / Procedures / Treatments   Labs (all labs ordered are listed, but only abnormal results are displayed) Labs Reviewed  CBC WITH DIFFERENTIAL/PLATELET - Abnormal; Notable for the following components:  Result Value   MCV 78.8 (*)    MCH 25.3 (*)    RDW 16.5 (*)    Platelets 445 (*)    Abs Immature Granulocytes 0.08 (*)    All other components within normal limits  COMPREHENSIVE METABOLIC PANEL  LIPASE, BLOOD  PREGNANCY, URINE  URINALYSIS, ROUTINE W REFLEX MICROSCOPIC    EKG None  Radiology CT Renal Stone Study  Result Date: 08/08/2022 CLINICAL DATA:  Three days of right flank pain. EXAM: CT ABDOMEN AND PELVIS WITHOUT CONTRAST TECHNIQUE: Multidetector CT imaging of the abdomen and pelvis was performed following the standard protocol without IV contrast. RADIATION DOSE REDUCTION: This exam was performed according to the departmental dose-optimization program which includes automated exposure control, adjustment of the mA and/or kV according to patient size  and/or use of iterative reconstruction technique. COMPARISON:  CT March 30, 2019 FINDINGS: Lower chest: No acute abnormality. Hepatobiliary: Hepatomegaly with diffuse hepatic steatosis. Gallbladder surgically absent. No biliary ductal dilation. Pancreas: No pancreatic ductal dilation or evidence of acute inflammation. Spleen: No splenomegaly. Adrenals/Urinary Tract: Bilateral adrenal glands appear normal. No hydronephrosis. No renal, ureteral or bladder calculi. Urinary bladder is unremarkable for degree of distension. Stomach/Bowel: Stomach is unremarkable for degree of distension. No pathologic dilation of small or large bowel. The appendix is not confidently identified however there is no pericecal inflammation. No evidence of acute bowel inflammation. Vascular/Lymphatic: Normal caliber abdominal aorta. Smooth IVC contours. No pathologically enlarged abdominal or pelvic lymph nodes. Reproductive: Enlarged right ovary measures 5.0 x 3.8 cm on image 77/2. Uterus is unremarkable. Left adnexa is unremarkable. Other: No significant abdominopelvic free fluid. Musculoskeletal: No acute osseous abnormality. IMPRESSION: 1. Enlarged right ovary measures 5.0 x 3.8 cm. Recommend further evaluation with pelvic ultrasound. 2. No renal, ureteral or bladder calculi. No hydronephrosis. 3. Hepatomegaly with diffuse hepatic steatosis. Electronically Signed   By: Dahlia Bailiff M.D.   On: 08/08/2022 12:09    Procedures Procedures    Medications Ordered in ED Medications  morphine (PF) 4 MG/ML injection 4 mg (4 mg Intravenous Given 08/08/22 0938)  ondansetron (ZOFRAN) injection 4 mg (4 mg Intravenous Given 08/08/22 0937)  sodium chloride 0.9 % bolus 1,000 mL (0 mLs Intravenous Stopped 08/08/22 1100)    ED Course/ Medical Decision Making/ A&P                           Medical Decision Making  Deborah Mcbride 37 y.o. presented today for flank pain. Working DDx that I considered at this time includes, but not limited to,  nephrolithiasis, pyonephritis, ectopic pregnancy, ovarian torsion, ovarian cyst.  Review of prior external notes: 08/03/22 Progress Note  Unique Tests and Interpretation: CBC w diff: Unremarkable Lipase: Unremarkable CMP: Unremarkable POC Urine Preg: UA: pending CT Stone Study: Negative for any stone  Discussion with Independent Historian: Husband  Discussion of Management of Tests: none at this time  Risk: Cannot be assessed at this time  Risk Stratification Score: none  Staffed with Suella Broad, PA-C and Dene Gentry, MD  R/o DDx: Nephrolithiasis: CT was negative for any stone Pyelonephritis: CT was negative for kidney infection  Plan: Presented for right flank pain that radiates through to her back.  Due to patient's documented history of allergies to Dilaudid and fentanyl she was given morphine after her and her husband verbalized that she has had it before and tolerated it.  After morphine was given patient noted that she felt "weird"but did not exhibit  any signs of anaphylaxis (throat closing, facial swelling, wheezing) but did have facial flushing. VS were stable at this time.   CT Stone Study was ordered and was negative for any stone but noted an enlarged R ovary.  Ultrasound was ordered to evaluate enlarged ovary.  On recheck patient appears more comfortable but still endorses pain.  Ultrasound was ordered and at the time of this note has not been read. If U/S r/o ectopic, then patient can be discharged with GYN follow-up. If U/S finds any abnormalities, GYN will be consulted. If U/S is positive for ectopic, then GYN surg will be called for an immediate detorsion. Patient was signed out to oncoming team at 1500.   Final Clinical Impression(s) / ED Diagnoses Final diagnoses:  None    Rx / DC Orders ED Discharge Orders     None         Remi Deter 08/08/22 1511    Wynetta Fines, MD 08/11/22 [phone]

## 2022-08-16 DIAGNOSIS — J00 Acute nasopharyngitis [common cold]: Secondary | ICD-10-CM | POA: Diagnosis not present

## 2022-08-16 DIAGNOSIS — R051 Acute cough: Secondary | ICD-10-CM | POA: Diagnosis not present

## 2022-08-24 DIAGNOSIS — N920 Excessive and frequent menstruation with regular cycle: Secondary | ICD-10-CM | POA: Diagnosis not present

## 2022-08-24 DIAGNOSIS — N83201 Unspecified ovarian cyst, right side: Secondary | ICD-10-CM | POA: Diagnosis not present

## 2022-08-24 DIAGNOSIS — N946 Dysmenorrhea, unspecified: Secondary | ICD-10-CM | POA: Diagnosis not present

## 2022-08-25 DIAGNOSIS — G4733 Obstructive sleep apnea (adult) (pediatric): Secondary | ICD-10-CM | POA: Diagnosis not present

## 2022-08-31 DIAGNOSIS — J209 Acute bronchitis, unspecified: Secondary | ICD-10-CM | POA: Diagnosis not present

## 2022-09-05 DIAGNOSIS — J40 Bronchitis, not specified as acute or chronic: Secondary | ICD-10-CM | POA: Diagnosis not present

## 2022-09-05 DIAGNOSIS — R059 Cough, unspecified: Secondary | ICD-10-CM | POA: Diagnosis not present

## 2022-09-26 DIAGNOSIS — N83201 Unspecified ovarian cyst, right side: Secondary | ICD-10-CM | POA: Diagnosis not present

## 2022-09-26 DIAGNOSIS — Z3041 Encounter for surveillance of contraceptive pills: Secondary | ICD-10-CM | POA: Diagnosis not present

## 2022-10-11 DIAGNOSIS — N920 Excessive and frequent menstruation with regular cycle: Secondary | ICD-10-CM | POA: Diagnosis not present

## 2022-10-13 DIAGNOSIS — N926 Irregular menstruation, unspecified: Secondary | ICD-10-CM | POA: Diagnosis not present

## 2022-10-13 DIAGNOSIS — N83201 Unspecified ovarian cyst, right side: Secondary | ICD-10-CM | POA: Diagnosis not present

## 2022-10-18 ENCOUNTER — Ambulatory Visit: Payer: BC Managed Care – PPO | Admitting: Internal Medicine

## 2022-10-21 ENCOUNTER — Ambulatory Visit: Payer: BC Managed Care – PPO | Admitting: Internal Medicine

## 2022-10-21 ENCOUNTER — Encounter: Payer: Self-pay | Admitting: Internal Medicine

## 2022-10-21 VITALS — BP 132/72 | HR 103 | Temp 97.9°F | Resp 16 | Ht 63.0 in | Wt 314.0 lb

## 2022-10-21 DIAGNOSIS — G4733 Obstructive sleep apnea (adult) (pediatric): Secondary | ICD-10-CM | POA: Diagnosis not present

## 2022-10-21 NOTE — Progress Notes (Signed)
Office Visit  Subjective   Patient ID: ABIMBOLA CABLES   DOB: 04-Sep-1985   Age: 37 y.o.   MRN: [id number]   Chief Complaint Chief Complaint  Patient presents with   Acute Visit    Paperwork     History of Present Illness Mrs. Lout is a 37 yo female who comes in today to do paperwork regarding ADA where her work is trying to put her on night shift.  She saw my NP last year who filled out forms.  The patient has a PMHx of morbid obesity with OSA and migraines.  She states if they change her work schedule, she will not get a full 8 hours of sleep and this effects her where she cannot physically and mentally function with slow response time, and has problems with somnolence and worsening migraines.  She currently works an 8-4:30 shift where she does well with this.  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       Past Medical History Past Medical History:  Diagnosis Date   Obesity      Allergies Allergies  Allergen Reactions   Fentanyl Anaphylaxis   Dilaudid [Hydromorphone Hcl] Itching     Medications  Current Outpatient Medications:    lidocaine (XYLOCAINE) 2 % solution, Use as directed 15 mLs in the mouth or throat as needed for mouth pain., Disp: 200 mL, Rfl: 0   omeprazole (PRILOSEC) 40 MG capsule, Take 40 mg by mouth daily., Disp: , Rfl:    ondansetron (ZOFRAN-ODT) 4 MG disintegrating tablet, Take 1 tablet (4 mg total) by mouth every 8 (eight) hours as needed for nausea or vomiting., Disp: 20 tablet, Rfl: 0   oxyCODONE-acetaminophen (PERCOCET/ROXICET) 5-325 MG tablet, Take 1 tablet by mouth every 6 (six) hours as needed for severe pain., Disp: 15 tablet, Rfl: 0   Semaglutide-Weight Management (WEGOVY) 0.5 MG/0.5ML SOAJ, Inject 0.5 mg into the skin once a week., Disp: 2 mL, Rfl: 1   Semaglutide-Weight Management (WEGOVY) 1 MG/0.5ML SOAJ, Inject 1 mg into the skin once a week., Disp: 2 mL, Rfl: 0   Semaglutide-Weight Management (WEGOVY) 1.7 MG/0.75ML  SOAJ, Inject 1.7 mg into the skin once a week., Disp: 3 mL, Rfl: 1   Review of Systems Review of Systems  Constitutional:  Negative for chills and fever.  Eyes:  Negative for blurred vision and double vision.  Respiratory:  Negative for cough and shortness of breath.   Cardiovascular:  Negative for chest pain, palpitations and leg swelling.  Gastrointestinal:  Negative for abdominal pain, constipation, diarrhea, nausea and vomiting.  Musculoskeletal:  Negative for myalgias.  Neurological:  Negative for dizziness, weakness and headaches.  Psychiatric/Behavioral:  Negative for depression. The patient is not nervous/anxious.        Objective:    Vitals BP 132/72   Pulse (!) 103   Temp 97.9 F (36.6 C)   Resp 16   Ht 5\' 3"  (1.6 m)   Wt (!) 314 lb (142.4 kg)   SpO2 98%   BMI 55.62 kg/m    Physical Examination Physical Exam Constitutional:      Appearance: Normal appearance. She is not ill-appearing.  Cardiovascular:     Rate and Rhythm: Normal rate and regular rhythm.     Pulses: Normal pulses.     Heart sounds: No murmur heard.    No friction rub. No gallop.  Pulmonary:     Effort: Pulmonary effort is normal. No respiratory distress.  Breath sounds: No wheezing, rhonchi or rales.  Abdominal:     General: Bowel sounds are normal. There is no distension.     Palpations: Abdomen is soft.     Tenderness: There is no abdominal tenderness.  Musculoskeletal:     Right lower leg: No edema.     Left lower leg: No edema.  Skin:    General: Skin is warm and dry.     Findings: No rash.  Neurological:     Mental Status: She is alert.        Assessment & Plan:   OSA (obstructive sleep apnea) I filled out ADA paperwork for her job.  I want her to continue to eat healthy, execise and lose weight which will help her with her OSA.  She remains compliant on her CPAP.    No follow-ups on file.   Townsend Roger, MD

## 2022-10-21 NOTE — Assessment & Plan Note (Signed)
I filled out ADA paperwork for her job.  I want her to continue to eat healthy, execise and lose weight which will help her with her OSA.  She remains compliant on her CPAP.

## 2022-10-24 ENCOUNTER — Other Ambulatory Visit: Payer: Self-pay

## 2022-10-24 MED ORDER — WEGOVY 1.7 MG/0.75ML ~~LOC~~ SOAJ: 1.7000 mg | SUBCUTANEOUS | 1 refills | Status: DC

## 2022-11-04 ENCOUNTER — Ambulatory Visit: Payer: BC Managed Care – PPO | Admitting: Internal Medicine

## 2022-11-16 ENCOUNTER — Ambulatory Visit: Payer: BC Managed Care – PPO | Admitting: Internal Medicine

## 2022-11-23 DIAGNOSIS — G4733 Obstructive sleep apnea (adult) (pediatric): Secondary | ICD-10-CM | POA: Diagnosis not present

## 2022-11-24 DIAGNOSIS — G4733 Obstructive sleep apnea (adult) (pediatric): Secondary | ICD-10-CM | POA: Diagnosis not present

## 2022-11-25 ENCOUNTER — Ambulatory Visit: Payer: BC Managed Care – PPO | Admitting: Internal Medicine

## 2022-11-30 DIAGNOSIS — N920 Excessive and frequent menstruation with regular cycle: Secondary | ICD-10-CM | POA: Diagnosis not present

## 2022-11-30 DIAGNOSIS — N83202 Unspecified ovarian cyst, left side: Secondary | ICD-10-CM | POA: Diagnosis not present

## 2022-12-07 ENCOUNTER — Encounter: Payer: Self-pay | Admitting: Internal Medicine

## 2022-12-07 ENCOUNTER — Other Ambulatory Visit (HOSPITAL_COMMUNITY): Payer: Self-pay

## 2022-12-07 ENCOUNTER — Ambulatory Visit: Payer: BC Managed Care – PPO | Admitting: Internal Medicine

## 2022-12-07 VITALS — BP 116/70 | HR 111 | Temp 98.8°F | Resp 1 | Ht 63.0 in | Wt 313.0 lb

## 2022-12-07 DIAGNOSIS — R000 Tachycardia, unspecified: Secondary | ICD-10-CM

## 2022-12-07 DIAGNOSIS — G4733 Obstructive sleep apnea (adult) (pediatric): Secondary | ICD-10-CM | POA: Diagnosis not present

## 2022-12-07 DIAGNOSIS — Z6841 Body Mass Index (BMI) 40.0 and over, adult: Secondary | ICD-10-CM | POA: Diagnosis not present

## 2022-12-07 MED ORDER — PROPRANOLOL HCL 10 MG PO TABS
10.0000 mg | ORAL_TABLET | Freq: Two times a day (BID) | ORAL | 4 refills | Status: DC
  Filled 2022-12-07: qty 60, 30d supply, fill #0

## 2022-12-07 MED ORDER — WEGOVY 0.5 MG/0.5ML ~~LOC~~ SOAJ
0.5000 mg | SUBCUTANEOUS | 2 refills | Status: DC
  Filled 2022-12-07: qty 3, 28d supply, fill #0
  Filled 2022-12-07: qty 2, 28d supply, fill #0

## 2022-12-07 NOTE — Assessment & Plan Note (Signed)
Her EKG has sinus tachycardia so I will start her on propranolol 10 mg twice a day and she will monitor her pulse at home.

## 2022-12-07 NOTE — Progress Notes (Signed)
   Office Visit  Subjective   Patient ID: Deborah Mcbride   DOB: 11/05/1985   Age: 37 y.o.   MRN: [id number]   Chief Complaint Chief Complaint  Patient presents with   Office visit    Discuss Wegovy     History of Present Illness 37 years old female who is here c/o refill of wegovy, his insurance require prior authorization but same insurance was covering for her medications before. She was taking 1.7 mg weekly until a month ago when he husband got sick and went to hospital. She wanted to restart her wegovy.  She has OSA and is on CPAP, she is a morbidly obese female with BMI of 55. She will get benefit from wegovy. Her heart rate is high, she does not tae any medication.   Past Medical History Past Medical History:  Diagnosis Date   Obesity      Allergies Allergies  Allergen Reactions   Fentanyl Shortness Of Breath, Itching and Nausea And Vomiting    rash   Dilaudid [Hydromorphone Hcl] Itching     Review of Systems Review of Systems  Constitutional: Negative.   HENT: Negative.    Respiratory: Negative.    Cardiovascular: Negative.   Gastrointestinal: Negative.   Neurological: Negative.        Objective:    Vitals BP 116/70 (BP Location: Left Arm, Patient Position: Sitting, Cuff Size: Large)   Pulse (!) 111   Temp 98.8 F (37.1 C)   Resp (!) 1   Ht 5\' 3"  (1.6 m)   Wt (!) 313 lb (142 kg)   SpO2 97%   BMI 55.45 kg/m    Physical Examination Physical Exam Constitutional:      Appearance: She is obese.  Cardiovascular:     Heart sounds: Normal heart sounds.  Neurological:     General: No focal deficit present.     Mental Status: She is alert and oriented to person, place, and time.        Assessment & Plan:   BMI 50.0-59.9, adult (HCC) I will restart her on wegovy 0.5 mg weekly and she will continue to watch her diet.  OSA treated with BiPAP She will continue to monitor.  Sinus tachycardia Her EKG has sinus tachycardia so I will start her on  propranolol 10 mg twice a day and she will monitor her pulse at home.    Return in about 3 months (around 03/09/2023).   Eloisa Northern, MD

## 2022-12-07 NOTE — Assessment & Plan Note (Signed)
She will continue to monitor.

## 2022-12-07 NOTE — Assessment & Plan Note (Signed)
I will restart her on wegovy 0.5 mg weekly and she will continue to watch her diet.

## 2022-12-08 ENCOUNTER — Other Ambulatory Visit (HOSPITAL_COMMUNITY): Payer: Self-pay

## 2022-12-10 DIAGNOSIS — N888 Other specified noninflammatory disorders of cervix uteri: Secondary | ICD-10-CM | POA: Diagnosis not present

## 2022-12-10 DIAGNOSIS — M549 Dorsalgia, unspecified: Secondary | ICD-10-CM | POA: Diagnosis not present

## 2022-12-10 DIAGNOSIS — R1031 Right lower quadrant pain: Secondary | ICD-10-CM | POA: Diagnosis not present

## 2022-12-10 DIAGNOSIS — R110 Nausea: Secondary | ICD-10-CM | POA: Diagnosis not present

## 2022-12-10 DIAGNOSIS — Z9049 Acquired absence of other specified parts of digestive tract: Secondary | ICD-10-CM | POA: Diagnosis not present

## 2022-12-10 DIAGNOSIS — Z888 Allergy status to other drugs, medicaments and biological substances status: Secondary | ICD-10-CM | POA: Diagnosis not present

## 2022-12-10 DIAGNOSIS — R109 Unspecified abdominal pain: Secondary | ICD-10-CM | POA: Diagnosis not present

## 2022-12-10 DIAGNOSIS — K6389 Other specified diseases of intestine: Secondary | ICD-10-CM | POA: Diagnosis not present

## 2022-12-10 DIAGNOSIS — R162 Hepatomegaly with splenomegaly, not elsewhere classified: Secondary | ICD-10-CM | POA: Diagnosis not present

## 2022-12-10 DIAGNOSIS — R102 Pelvic and perineal pain: Secondary | ICD-10-CM | POA: Diagnosis not present

## 2022-12-12 DIAGNOSIS — R7303 Prediabetes: Secondary | ICD-10-CM | POA: Diagnosis not present

## 2022-12-14 DIAGNOSIS — R1031 Right lower quadrant pain: Secondary | ICD-10-CM | POA: Diagnosis not present

## 2022-12-14 DIAGNOSIS — R102 Pelvic and perineal pain: Secondary | ICD-10-CM | POA: Diagnosis not present

## 2022-12-16 ENCOUNTER — Ambulatory Visit: Payer: BC Managed Care – PPO | Admitting: Internal Medicine

## 2022-12-17 ENCOUNTER — Other Ambulatory Visit (HOSPITAL_COMMUNITY): Payer: Self-pay

## 2022-12-19 DIAGNOSIS — R7303 Prediabetes: Secondary | ICD-10-CM | POA: Diagnosis not present

## 2022-12-23 DIAGNOSIS — G4733 Obstructive sleep apnea (adult) (pediatric): Secondary | ICD-10-CM | POA: Diagnosis not present

## 2023-01-03 ENCOUNTER — Other Ambulatory Visit (HOSPITAL_COMMUNITY): Payer: Self-pay

## 2023-01-03 ENCOUNTER — Other Ambulatory Visit: Payer: Self-pay | Admitting: Internal Medicine

## 2023-01-03 MED ORDER — ZEPBOUND 2.5 MG/0.5ML ~~LOC~~ SOAJ
2.5000 mg | SUBCUTANEOUS | 1 refills | Status: DC
  Filled 2023-01-04 – 2023-01-10 (×3): qty 2, 28d supply, fill #0
  Filled 2023-01-10: qty 4, 56d supply, fill #0
  Filled 2023-02-06: qty 2, 28d supply, fill #1

## 2023-01-04 ENCOUNTER — Other Ambulatory Visit (HOSPITAL_COMMUNITY): Payer: Self-pay

## 2023-01-05 ENCOUNTER — Other Ambulatory Visit (HOSPITAL_COMMUNITY): Payer: Self-pay

## 2023-01-05 DIAGNOSIS — J069 Acute upper respiratory infection, unspecified: Secondary | ICD-10-CM | POA: Diagnosis not present

## 2023-01-07 ENCOUNTER — Other Ambulatory Visit (HOSPITAL_COMMUNITY): Payer: Self-pay

## 2023-01-09 ENCOUNTER — Other Ambulatory Visit (HOSPITAL_COMMUNITY): Payer: Self-pay

## 2023-01-10 ENCOUNTER — Other Ambulatory Visit (HOSPITAL_COMMUNITY): Payer: Self-pay

## 2023-01-10 DIAGNOSIS — Z01411 Encounter for gynecological examination (general) (routine) with abnormal findings: Secondary | ICD-10-CM | POA: Diagnosis not present

## 2023-01-10 DIAGNOSIS — B372 Candidiasis of skin and nail: Secondary | ICD-10-CM | POA: Diagnosis not present

## 2023-01-10 DIAGNOSIS — Z124 Encounter for screening for malignant neoplasm of cervix: Secondary | ICD-10-CM | POA: Diagnosis not present

## 2023-01-10 LAB — HM PAP SMEAR

## 2023-01-12 DIAGNOSIS — G4733 Obstructive sleep apnea (adult) (pediatric): Secondary | ICD-10-CM | POA: Diagnosis not present

## 2023-01-13 ENCOUNTER — Other Ambulatory Visit (HOSPITAL_COMMUNITY): Payer: Self-pay

## 2023-01-19 ENCOUNTER — Encounter: Payer: Self-pay | Admitting: Internal Medicine

## 2023-01-19 ENCOUNTER — Ambulatory Visit (INDEPENDENT_AMBULATORY_CARE_PROVIDER_SITE_OTHER): Payer: BC Managed Care – PPO | Admitting: Internal Medicine

## 2023-01-19 VITALS — BP 132/68 | HR 83 | Temp 99.1°F | Ht 63.0 in | Wt 318.0 lb

## 2023-01-19 DIAGNOSIS — K219 Gastro-esophageal reflux disease without esophagitis: Secondary | ICD-10-CM | POA: Diagnosis not present

## 2023-01-19 DIAGNOSIS — R0981 Nasal congestion: Secondary | ICD-10-CM | POA: Diagnosis not present

## 2023-01-19 MED ORDER — FAMOTIDINE 40 MG PO TABS: 40.0000 mg | ORAL_TABLET | Freq: Every day | ORAL | 1 refills | Status: DC

## 2023-01-19 NOTE — Assessment & Plan Note (Signed)
Prescription for famotidine 40 mg p.o. daily

## 2023-01-19 NOTE — Assessment & Plan Note (Addendum)
Advised patient to try Zyrtec 10 mg p.o. daily along with Flonase nasal spray 1 spray each nostril twice a day If medication does not help will discuss at follow-up appointments about referral to ENT and/or CT of sinuses

## 2023-01-19 NOTE — Progress Notes (Signed)
[facility] [address] [place] [address] Dept: [phone] Dept Fax: [phone]  New Patient Office Visit  Subjective:   Deborah Mcbride 10-24-1985 01/19/2023  Chief Complaint  Patient presents with   Establish Care    HPI: Deborah Mcbride presents today to establish care at [facility]. Introduced to Publishing rights manager role and practice setting.  All questions answered.   Last PCP: Michel Santee eyk md Last annual physical: 1 week ago with OBGYN  Concerns: See below   CHRONIC NASAL CONGESTION: Patient complains of allergy symptoms of nasal congestion, postnasal drip, and clearing her throat from phlegm originally started back in January of this year.  She states she was seen at the urgent care and had been diagnosed with bronchitis, was treated with antibiotics and prednisone.  She states symptoms improved and then returned was given a second round of steroids and antibiotics.  She states symptoms have fluctuated since then but have recently returned over these past couple weeks.  She reports although symptoms can be bothersome during the daytime, it is primarily at nighttime when she noticed that she cannot breathe through her nose.  She does wear a CPAP machine, she has cleaned her mask and tubing but that did not help her symptoms.  She has tried Xyzal in the past.  She is currently using Flonase at nighttime, but reports only minimal help.   GERD: Deborah Mcbride presents for the medical management of GERD.  Current medication: Famotidine  Well controlled: with medication, but not currently     The following portions of the patient's history were reviewed and updated as appropriate: past medical history, past surgical history, family history, social history, allergies, medications, and problem list.   Patient Active Problem List   Diagnosis Date Noted   Nasal congestion 01/19/2023    Sinus tachycardia 12/07/2022   OSA (obstructive sleep apnea) 10/21/2022   BMI 50.0-59.9, adult (HCC) 08/03/2022   Morbid obesity (HCC) 07/07/2022   OSA treated with BiPAP 07/07/2022   Gastroesophageal reflux disease without esophagitis 07/07/2022   Past Medical History:  Diagnosis Date   Obesity    Past Surgical History:  Procedure Laterality Date   CESAREAN SECTION     CHOLECYSTECTOMY     TONSILLECTOMY     TUBAL LIGATION     History reviewed. No pertinent family history. Outpatient Medications Prior to Visit  Medication Sig Dispense Refill   cholecalciferol (VITAMIN D3) 25 MCG (1000 UNIT) tablet Take by mouth daily.     ibuprofen (ADVIL) 800 MG tablet as needed.     ondansetron (ZOFRAN) 4 MG tablet Take by mouth as needed.     tirzepatide (ZEPBOUND) 2.5 MG/0.5ML Pen Inject 2.5 mg into the skin once a week. (Patient not taking: Reported on 01/19/2023) 4 mL 1   propranolol (INDERAL) 10 MG tablet Take 1 tablet (10 mg total) by mouth 2 (two) times daily. 60 tablet 4   No facility-administered medications prior to visit.   Allergies  Allergen Reactions   Fentanyl Shortness Of Breath, Itching and Nausea And Vomiting    rash   Hydromorphone Itching   Dilaudid [Hydromorphone Hcl] Itching    ROS: A complete ROS was performed with pertinent positives/negatives noted in the HPI. The remainder of the ROS are negative.   Objective:   Today's Vitals   01/19/23 1527  BP: 132/68  Pulse: 83  Temp: 99.1 F (37.3 C)  TempSrc: Temporal  SpO2:  98%  Weight: (!) 318 lb (144.2 kg)  Height: 5\' 3"  (1.6 m)    GENERAL: Well-appearing, in NAD. Well nourished.  SKIN: Pink, warm and dry. No rash, lesion, ulceration, or ecchymoses.  HEENT:    HEAD: Normocephalic, non-traumatic.  EYES: Conjunctive pink without exudate. PERRL, EOMI.  EARS: External ear w/o redness, swelling, masses, or lesions. EAC clear. TM's intact, translucent w/o bulging, appropriate landmarks visualized.  NOSE: Septum  midline w/o deformity. Nares patent, mucosa pink, swollen, and inflamed turbinates w/o drainage. No sinus tenderness.  THROAT: Uvula midline. Oropharynx clear. Tonsils non-inflamed w/o exudate. Mucus membranes pink and moist.  NECK: Trachea midline. Full ROM w/o pain or tenderness. No lymphadenopathy.  RESPIRATORY: Chest wall symmetrical. Respirations even and non-labored. Breath sounds clear to auscultation bilaterally.  CARDIAC: S1, S2 present, regular rate and rhythm. Peripheral pulses 2+ bilaterally.  EXTREMITIES: Without clubbing, cyanosis, or edema.  NEUROLOGIC: Steady, even gait.  PSYCH/MENTAL STATUS: Alert, oriented x 3. Cooperative, appropriate mood and affect.   Health Maintenance Due  Topic Date Due   DTaP/Tdap/Td (1 - Tdap) Never done    Results for orders placed or performed in visit on 01/19/23  HM PAP SMEAR  Result Value Ref Range   HM Pap smear in  care everywhere        Assessment & Plan:  Gastroesophageal reflux disease without esophagitis Assessment & Plan: Prescription for famotidine 40 mg p.o. daily  Orders: -     Famotidine; Take 1 tablet (40 mg total) by mouth daily.  Dispense: 90 tablet; Refill: 1  Nasal congestion Assessment & Plan: Advised patient to try Zyrtec 10 mg p.o. daily along with Flonase nasal spray 1 spray each nostril twice a day If medication does not help will discuss at follow-up appointments about referral to ENT and/or CT of sinuses       Return in about 3 months (around 04/21/2023) for allergy symptoms, weight check.   Salvatore Decent, FNP

## 2023-01-23 DIAGNOSIS — G4733 Obstructive sleep apnea (adult) (pediatric): Secondary | ICD-10-CM | POA: Diagnosis not present

## 2023-01-24 DIAGNOSIS — N920 Excessive and frequent menstruation with regular cycle: Secondary | ICD-10-CM | POA: Diagnosis not present

## 2023-01-24 DIAGNOSIS — Z3202 Encounter for pregnancy test, result negative: Secondary | ICD-10-CM | POA: Diagnosis not present

## 2023-01-26 DIAGNOSIS — M25532 Pain in left wrist: Secondary | ICD-10-CM | POA: Diagnosis not present

## 2023-01-26 DIAGNOSIS — M654 Radial styloid tenosynovitis [de Quervain]: Secondary | ICD-10-CM | POA: Diagnosis not present

## 2023-01-31 DIAGNOSIS — R7303 Prediabetes: Secondary | ICD-10-CM | POA: Diagnosis not present

## 2023-02-07 ENCOUNTER — Other Ambulatory Visit (HOSPITAL_COMMUNITY): Payer: Self-pay

## 2023-02-11 DIAGNOSIS — G4733 Obstructive sleep apnea (adult) (pediatric): Secondary | ICD-10-CM | POA: Diagnosis not present

## 2023-02-15 ENCOUNTER — Other Ambulatory Visit (HOSPITAL_COMMUNITY): Payer: Self-pay

## 2023-02-15 ENCOUNTER — Encounter: Payer: Self-pay | Admitting: Internal Medicine

## 2023-02-17 ENCOUNTER — Other Ambulatory Visit (HOSPITAL_COMMUNITY): Payer: Self-pay

## 2023-02-17 ENCOUNTER — Other Ambulatory Visit: Payer: Self-pay | Admitting: Internal Medicine

## 2023-02-17 NOTE — Telephone Encounter (Signed)
Pt called to check on the status of this med Rx #: [id number]  tirzepatide (ZEPBOUND) 2.5 MG/0.5ML Pen [[id number]]

## 2023-02-20 ENCOUNTER — Other Ambulatory Visit (HOSPITAL_COMMUNITY): Payer: Self-pay

## 2023-02-20 MED ORDER — TIRZEPATIDE-WEIGHT MANAGEMENT 5 MG/0.5ML ~~LOC~~ SOAJ
5.0000 mg | SUBCUTANEOUS | 0 refills | Status: DC
Start: 2023-02-20 — End: 2023-03-16
  Filled 2023-02-20: qty 6, 84d supply, fill #0
  Filled 2023-02-20: qty 2, 28d supply, fill #0

## 2023-02-22 ENCOUNTER — Other Ambulatory Visit (HOSPITAL_COMMUNITY): Payer: Self-pay

## 2023-02-22 MED ORDER — IBUPROFEN 800 MG PO TABS
800.0000 mg | ORAL_TABLET | Freq: Three times a day (TID) | ORAL | 1 refills | Status: DC | PRN
  Filled 2023-02-22 – 2023-05-22 (×4): qty 30, 10d supply, fill #0

## 2023-02-22 MED ORDER — ONDANSETRON HCL 4 MG PO TABS
4.0000 mg | ORAL_TABLET | Freq: Three times a day (TID) | ORAL | 0 refills | Status: DC | PRN
  Filled 2023-02-22 – 2023-03-16 (×3): qty 20, 7d supply, fill #0

## 2023-02-22 MED ORDER — VITAMIN D3 25 MCG (1000 UNIT) PO TABS
1000.0000 [IU] | ORAL_TABLET | Freq: Every day | ORAL | 1 refills | Status: DC
  Filled 2023-02-22 – 2023-03-16 (×2): qty 90, 90d supply, fill #0
  Filled 2023-03-16 (×3): qty 100, 100d supply, fill #0
  Filled 2023-05-22: qty 90, 90d supply, fill #0

## 2023-03-02 ENCOUNTER — Other Ambulatory Visit (HOSPITAL_COMMUNITY): Payer: Self-pay

## 2023-03-06 DIAGNOSIS — N926 Irregular menstruation, unspecified: Secondary | ICD-10-CM | POA: Diagnosis not present

## 2023-03-14 DIAGNOSIS — G4733 Obstructive sleep apnea (adult) (pediatric): Secondary | ICD-10-CM | POA: Diagnosis not present

## 2023-03-16 ENCOUNTER — Encounter (INDEPENDENT_AMBULATORY_CARE_PROVIDER_SITE_OTHER): Payer: Self-pay

## 2023-03-16 ENCOUNTER — Telehealth: Payer: Self-pay | Admitting: Internal Medicine

## 2023-03-16 ENCOUNTER — Other Ambulatory Visit: Payer: Self-pay | Admitting: Internal Medicine

## 2023-03-16 ENCOUNTER — Other Ambulatory Visit (HOSPITAL_COMMUNITY): Payer: Self-pay

## 2023-03-16 MED ORDER — ZEPBOUND 5 MG/0.5ML ~~LOC~~ SOAJ
5.0000 mg | SUBCUTANEOUS | 0 refills | Status: DC
  Filled 2023-03-16: qty 6, 84d supply, fill #0

## 2023-03-16 NOTE — Telephone Encounter (Signed)
Pt would like a call to discuss a med she has taken to curb her appetite. She would like to take that.She did not know the name.

## 2023-03-16 NOTE — Telephone Encounter (Signed)
Caller Name: Heahther Call back phone #: [phone]   MEDICATION(S):  Rx #: [id number]  tirzepatide (ZEPBOUND) 5 MG/0.5ML Pen [[id number]]  Pt wants to raise to 7,5 mg    Preferred Pharmacy:  [facility] pharmacy

## 2023-03-17 ENCOUNTER — Telehealth: Payer: Self-pay | Admitting: Internal Medicine

## 2023-03-17 ENCOUNTER — Other Ambulatory Visit (HOSPITAL_COMMUNITY): Payer: Self-pay

## 2023-03-17 NOTE — Telephone Encounter (Signed)
Returned patient call and patient is requesting a dose change in zepbound and also Topamax or and appetite suppressant

## 2023-03-17 NOTE — Telephone Encounter (Signed)
Pt would like for you a call to discuss zepbound

## 2023-03-20 ENCOUNTER — Other Ambulatory Visit (HOSPITAL_COMMUNITY): Payer: Self-pay

## 2023-03-20 ENCOUNTER — Other Ambulatory Visit: Payer: Self-pay | Admitting: Internal Medicine

## 2023-03-20 MED ORDER — TIRZEPATIDE 7.5 MG/0.5ML ~~LOC~~ SOAJ: 7.5000 mg | SUBCUTANEOUS | 0 refills | Status: DC

## 2023-03-20 MED ORDER — ZEPBOUND 7.5 MG/0.5ML ~~LOC~~ SOAJ
7.5000 mg | SUBCUTANEOUS | 0 refills | Status: DC
  Filled 2023-03-20: qty 6, 84d supply, fill #0

## 2023-03-20 NOTE — Telephone Encounter (Signed)
Patient informed of message  

## 2023-03-20 NOTE — Telephone Encounter (Signed)
Zepbound dose change sent in for patient. Zepbound is also an appetite suppressant. Further medications will need to be discussed at her upcoming appointment in September.

## 2023-03-20 NOTE — Addendum Note (Signed)
Addended by: Mary Sella D on: 03/20/2023 03:13 PM   Modules accepted: Orders

## 2023-03-22 ENCOUNTER — Other Ambulatory Visit: Payer: Self-pay

## 2023-03-27 DIAGNOSIS — R162 Hepatomegaly with splenomegaly, not elsewhere classified: Secondary | ICD-10-CM | POA: Diagnosis not present

## 2023-03-27 DIAGNOSIS — N939 Abnormal uterine and vaginal bleeding, unspecified: Secondary | ICD-10-CM | POA: Diagnosis not present

## 2023-03-27 DIAGNOSIS — M7918 Myalgia, other site: Secondary | ICD-10-CM | POA: Diagnosis not present

## 2023-03-27 DIAGNOSIS — R102 Pelvic and perineal pain: Secondary | ICD-10-CM | POA: Diagnosis not present

## 2023-04-10 ENCOUNTER — Ambulatory Visit (INDEPENDENT_AMBULATORY_CARE_PROVIDER_SITE_OTHER): Payer: BC Managed Care – PPO | Admitting: Internal Medicine

## 2023-04-10 ENCOUNTER — Encounter: Payer: Self-pay | Admitting: Internal Medicine

## 2023-04-10 VITALS — BP 116/70 | HR 63 | Temp 98.4°F | Ht 63.0 in | Wt 309.8 lb

## 2023-04-10 DIAGNOSIS — R162 Hepatomegaly with splenomegaly, not elsewhere classified: Secondary | ICD-10-CM | POA: Diagnosis not present

## 2023-04-10 DIAGNOSIS — R42 Dizziness and giddiness: Secondary | ICD-10-CM | POA: Diagnosis not present

## 2023-04-10 DIAGNOSIS — Z0000 Encounter for general adult medical examination without abnormal findings: Secondary | ICD-10-CM | POA: Diagnosis not present

## 2023-04-10 MED ORDER — ONDANSETRON HCL 4 MG PO TABS: 4.0000 mg | ORAL_TABLET | Freq: Three times a day (TID) | ORAL | 0 refills | Status: DC | PRN

## 2023-04-10 NOTE — Progress Notes (Signed)
Subjective:   LIMA SQUARE 19-Sep-1985  04/10/2023   CC: Chief Complaint  Patient presents with   Annual Exam    Discuss lab results     HPI: Deborah Mcbride is a 37 y.o. female who presents for a routine health maintenance exam.  Labs collected at time of visit.    Patient would like to discuss recent CT results from ER.  Patient was seen at emergency room on 12/10/2022 for right lower quadrant pain.  CT abdomen pelvis with contrast was conducted at that time, which showed hepatosplenomegaly.  Her liver enzymes were within normal limits.  No other acute findings.  Patient also had ultrasound pelvis transvaginal which did show multiple nabothian cysts in the cervix.  She did see her OB/GYN on 03/27/2023, who recommended patient do pelvic floor therapy.  Per office note, they discussed possibility of hysterectomy including pros and cons of procedure, and we will revisit surgical discussion in 2 months after patient does PT.   Patient states recently over the past couple weeks she has noticed that when she is driving or when she is a passenger in the vehicle, she becomes dizzy and gets nauseated.  She also reports she has had these episodes even when sitting still at her home.     Patient is currently on Zepbound 7.5 mg once weekly, she has lost approximately 11 pounds.  She is trying to adhere to a healthy diet and routine exercise.     HEALTH SCREENINGS: - Pap smear: pap done , with OBGYN due 2027 - Mammogram (40+): Not applicable  - Colonoscopy (45+): Not applicable  - Bone Density (65+): Not applicable  - Lung CA screening with low-dose CT:  Not applicable Adults age 56-80 who are current cigarette smokers or quit within the last 15 years. Must have 20 pack year history.   Depression and Anxiety Screen done today and results listed below:     04/10/2023    8:43 AM 01/19/2023    3:43 PM 07/07/2022   10:10 AM  Depression screen PHQ 2/9  Decreased Interest 0 0 0  Down,  Depressed, Hopeless 0 0 0  PHQ - 2 Score 0 0 0  Altered sleeping  0   Tired, decreased energy  0   Change in appetite  0   Feeling bad or failure about yourself   0   Trouble concentrating  0   Moving slowly or fidgety/restless  0   Suicidal thoughts  0   PHQ-9 Score  0   Difficult doing work/chores  Not difficult at all       01/19/2023    3:43 PM  GAD 7 : Generalized Anxiety Score  Nervous, Anxious, on Edge 0  Control/stop worrying 0  Worry too much - different things 0  Trouble relaxing 0  Restless 0  Easily annoyed or irritable 0  Afraid - awful might happen 0  Total GAD 7 Score 0  Anxiety Difficulty Not difficult at all    IMMUNIZATIONS: - Tdap: Tetanus vaccination status reviewed: declined.  - Influenza: Refused - Pneumovax: Not applicable - Prevnar 20: Not applicable - Zostavax (50+): Not applicable   Past medical history, surgical history, medications, allergies, family history and social history reviewed with patient today and changes made to appropriate areas of the chart.   Past Medical History:  Diagnosis Date   Obesity     Past Surgical History:  Procedure Laterality Date   CESAREAN SECTION     CHOLECYSTECTOMY  TONSILLECTOMY     TUBAL LIGATION      Current Outpatient Medications on File Prior to Visit  Medication Sig   famotidine (PEPCID) 40 MG tablet Take 1 tablet (40 mg total) by mouth daily. (Patient taking differently: Take 40 mg by mouth as needed.)   ibuprofen (ADVIL) 800 MG tablet Take 1 tablet (800 mg total) by mouth every 8 (eight) hours as needed for moderate pain.   tirzepatide (ZEPBOUND) 7.5 MG/0.5ML Pen Inject 7.5 mg into the skin once a week.   cholecalciferol (VITAMIN D3) 25 MCG (1000 UNIT) tablet Take 1 tablet (1,000 Units total) by mouth daily. (Patient not taking: Reported on 04/10/2023)   No current facility-administered medications on file prior to visit.    Allergies  Allergen Reactions   Fentanyl Shortness Of Breath,  Itching and Nausea And Vomiting    rash   Hydromorphone Itching   Dilaudid [Hydromorphone Hcl] Itching     Social History   Socioeconomic History   Marital status: Married    Spouse name: Not on file   Number of children: Not on file   Years of education: Not on file   Highest education level: Not on file  Occupational History   Not on file  Tobacco Use   Smoking status: Never   Smokeless tobacco: Never  Vaping Use   Vaping status: Never Used  Substance and Sexual Activity   Alcohol use: No   Drug use: No   Sexual activity: Not on file  Other Topics Concern   Not on file  Social History Narrative   Not on file   Social Determinants of Health   Financial Resource Strain: Not on file  Food Insecurity: Not on file  Transportation Needs: Not on file  Physical Activity: Not on file  Stress: Not on file  Social Connections: Unknown (12/10/2022)   Received from [facility]   Social Network    Social Network: Not on file  Intimate Partner Violence: Not At Risk (12/10/2022)   Received from [facility], [facility]   HITS    Over the last 12 months how often did your partner physically hurt you?: 1    Over the last 12 months how often did your partner insult you or talk down to you?: 1    Over the last 12 months how often did your partner threaten you with physical harm?: 1    Over the last 12 months how often did your partner scream or curse at you?: 1   Social History   Tobacco Use  Smoking Status Never  Smokeless Tobacco Never   Social History   Substance and Sexual Activity  Alcohol Use No    History reviewed. No pertinent family history.   ROS: Denies fever, fatigue, unexplained weight loss/gain, hearing or vision changes, cardiac or respiratory complaints. Denies neurological deficits, musculoskeletal complaints, gastrointestinal or genitourinary complaints, mental health complaints, and skin changes.   Objective:   Today's Vitals    04/10/23 0843  BP: 116/70  Pulse: 63  Temp: 98.4 F (36.9 C)  TempSrc: Temporal  SpO2: 98%  Weight: (!) 309 lb 12.8 oz (140.5 kg)  Height: 5\' 3"  (1.6 m)    GENERAL APPEARANCE: Well-appearing, in NAD. Well nourished.  SKIN: Pink, warm and dry. Turgor normal. No rash, lesion. Hair evenly distributed.  HEENT: HEAD: Normocephalic.  EYES: PERRLA. EOMI. Lids intact w/o defect. Sclera white, Conjunctiva pink w/o exudate.  EARS: External ear w/o redness, swelling, masses or lesions.  EAC clear. TM's intact, translucent w/o bulging, appropriate landmarks visualized. Appropriate acuity to conversational tones.  NOSE: Septum midline w/o deformity. Nares patent, mucosa pink and non-inflamed w/o drainage.  THROAT: Uvula midline. Oropharynx clear. Tonsils non-inflamed w/o exudate . Oral mucosa pink and moist.  NECK: Supple, Trachea midline. Full ROM w/o pain or tenderness. No lymphadenopathy. Thyroid non-tender w/o enlargement or palpable masses.  BREASTS: Breasts pendulous, symmetrical, and w/o palpable masses. Nipples everted and w/o discharge. No rash or skin retraction. No axillary or supraclavicular lymphadenopathy.  RESPIRATORY: Chest wall symmetrical w/o masses. Respirations even and non-labored. Breath sounds clear to auscultation bilaterally. No wheezes, rales, rhonchi, or crackles. CARDIAC: S1, S2 present, regular rate and rhythm. No gallops, murmurs, rubs, or clicks. Capillary refill <2 seconds. Peripheral pulses 2+ bilaterally. GI: Abdomen soft w/o distention. Normoactive bowel sounds. No palpable masses or tenderness. No guarding or rebound tenderness. Liver and spleen w/o tenderness or palpable enlargement. No CVA tenderness.  GU: deferred.  MSK: Muscle tone and strength appropriate for age, w/o atrophy or abnormal movement.  EXTREMITIES: Active ROM intact, w/o tenderness, crepitus, or contracture. No obvious joint deformities or effusions. No clubbing, edema, or cyanosis.  NEUROLOGIC: CN's  II-XII intact. Motor strength symmetrical with no obvious weakness. No sensory deficits. Steady, even gait.  PSYCH/MENTAL STATUS: Alert, oriented x 3. Cooperative, appropriate mood and affect.    Assessment & Plan:  Encounter for general adult medical examination without abnormal findings -     CBC with Differential/Platelet; Future -     Comprehensive metabolic panel; Future -     Lipid panel; Future -     TSH; Future  Morbid obesity (HCC) -Continue Zepbound 7.5 mg weekly -She would also like refill on Zofran due to nausea that is sometimes caused by the Zepbound.  Dizziness -Discussed possibility of motion sickness and/or vertigo  - Patient will try OTC nondrowsy Dramamine to see if this improves symptoms  Other orders -     Ondansetron HCl; Take 1 tablet (4 mg total) by mouth every 8 (eight) hours as needed for nausea or vomiting.  Dispense: 20 tablet; Refill: 0  Hepatosplenomegaly - obtaining lab work    Orders Placed This Encounter  Procedures   CBC with Differential/Platelet    Standing Status:   Future    Standing Expiration Date:   04/09/2024   Comprehensive metabolic panel    Standing Status:   Future    Standing Expiration Date:   04/09/2024   Lipid panel    Standing Status:   Future    Standing Expiration Date:   04/09/2024   TSH    Standing Status:   Future    Standing Expiration Date:   04/09/2024    PATIENT COUNSELING:  - Encouraged a healthy well-balanced diet. Patient may adjust caloric intake to maintain or achieve ideal body weight. May reduce intake of dietary saturated fat and total fat and have adequate dietary potassium and calcium preferably from fresh fruits, vegetables, and low-fat dairy products.   - Advised to avoid cigarette smoking. - Discussed with the patient that most people either abstain from alcohol or drink within safe limits (<=14/week and <=4 drinks/occasion for males, <=7/weeks and <= 3 drinks/occasion for females) and that the risk for  alcohol disorders and other health effects rises proportionally with the number of drinks per week and how often a drinker exceeds daily limits. - Discussed cessation/primary prevention of drug use and availability of treatment for abuse.   - Stressed the importance  of regular exercise - Injury prevention: Discussed safety belts, safety helmets, smoke detector, smoking near bedding or upholstery.  - Dental health: Discussed importance of regular tooth brushing, flossing, and dental visits.   NEXT PREVENTATIVE PHYSICAL DUE IN 1 YEAR.  Return in about 3 months (around 07/10/2023) for weight loss.  Salvatore Decent, FNP

## 2023-04-12 DIAGNOSIS — M7741 Metatarsalgia, right foot: Secondary | ICD-10-CM | POA: Diagnosis not present

## 2023-04-12 DIAGNOSIS — M79671 Pain in right foot: Secondary | ICD-10-CM | POA: Diagnosis not present

## 2023-04-12 DIAGNOSIS — G4733 Obstructive sleep apnea (adult) (pediatric): Secondary | ICD-10-CM | POA: Diagnosis not present

## 2023-04-14 DIAGNOSIS — G4733 Obstructive sleep apnea (adult) (pediatric): Secondary | ICD-10-CM | POA: Diagnosis not present

## 2023-04-24 ENCOUNTER — Encounter: Payer: BC Managed Care – PPO | Admitting: Internal Medicine

## 2023-05-15 ENCOUNTER — Other Ambulatory Visit (HOSPITAL_COMMUNITY): Payer: Self-pay

## 2023-05-16 ENCOUNTER — Other Ambulatory Visit (HOSPITAL_COMMUNITY): Payer: Self-pay

## 2023-05-16 ENCOUNTER — Other Ambulatory Visit: Payer: Self-pay | Admitting: Internal Medicine

## 2023-05-16 MED ORDER — ZEPBOUND 7.5 MG/0.5ML ~~LOC~~ SOAJ
7.5000 mg | SUBCUTANEOUS | 0 refills | Status: DC
  Filled 2023-05-16: qty 6, 84d supply, fill #0

## 2023-05-17 ENCOUNTER — Other Ambulatory Visit (HOSPITAL_COMMUNITY): Payer: Self-pay

## 2023-05-17 ENCOUNTER — Telehealth: Payer: Self-pay | Admitting: Internal Medicine

## 2023-05-17 MED ORDER — TIRZEPATIDE 10 MG/0.5ML ~~LOC~~ SOAJ
10.0000 mg | SUBCUTANEOUS | 0 refills | Status: DC
Start: 2023-05-17 — End: 2023-05-18
  Filled 2023-05-17: qty 2, 28d supply, fill #0

## 2023-05-17 NOTE — Addendum Note (Signed)
Addended by: Mary Sella D on: 05/17/2023 02:57 PM   Modules accepted: Orders

## 2023-05-17 NOTE — Telephone Encounter (Signed)
Dose changed and sent to the pharmacy

## 2023-05-17 NOTE — Telephone Encounter (Signed)
Refill request  Rx #: [id number]  tirzepatide (ZEPBOUND) 7.5 MG/0.5ML Pen [[id number]]   Pt is ready for thr 10 mg   [facility] [address], [place] [address] Phone: [phone]  Fax: [phone]

## 2023-05-18 ENCOUNTER — Telehealth: Payer: Self-pay | Admitting: Internal Medicine

## 2023-05-18 ENCOUNTER — Other Ambulatory Visit: Payer: Self-pay | Admitting: Internal Medicine

## 2023-05-18 ENCOUNTER — Other Ambulatory Visit (HOSPITAL_COMMUNITY): Payer: Self-pay

## 2023-05-18 MED ORDER — TIRZEPATIDE 10 MG/0.5ML ~~LOC~~ SOAJ
10.0000 mg | SUBCUTANEOUS | 0 refills | Status: DC
Start: 2023-05-18 — End: 2023-05-18
  Filled 2023-05-18: qty 2, 28d supply, fill #0

## 2023-05-18 MED ORDER — ZEPBOUND 10 MG/0.5ML ~~LOC~~ SOAJ
10.0000 mg | SUBCUTANEOUS | 1 refills | Status: DC
Start: 2023-05-18 — End: 2023-05-23
  Filled 2023-05-18 – 2023-05-19 (×2): qty 2, 28d supply, fill #0

## 2023-05-18 NOTE — Telephone Encounter (Signed)
05/18/23 - pt called asking that her medication Rx #: [id number]  tirzepatide (ZEPBOUND) 7.5 MG/0.5ML Pen [[id number]] be increased to the 10 MG. She wants a call back from the CMA at [phone].   She wants med sent to the pharmacy below:  [facility] [address] [place] [phone]

## 2023-05-18 NOTE — Addendum Note (Signed)
Addended by: Mary Sella D on: 05/18/2023 03:20 PM   Modules accepted: Orders

## 2023-05-19 ENCOUNTER — Other Ambulatory Visit (HOSPITAL_COMMUNITY): Payer: Self-pay

## 2023-05-20 ENCOUNTER — Other Ambulatory Visit (HOSPITAL_COMMUNITY): Payer: Self-pay

## 2023-05-20 ENCOUNTER — Other Ambulatory Visit: Payer: Self-pay | Admitting: Family

## 2023-05-22 ENCOUNTER — Other Ambulatory Visit: Payer: Self-pay | Admitting: Internal Medicine

## 2023-05-22 ENCOUNTER — Encounter: Payer: Self-pay | Admitting: Internal Medicine

## 2023-05-22 ENCOUNTER — Other Ambulatory Visit: Payer: Self-pay

## 2023-05-22 ENCOUNTER — Telehealth: Payer: Self-pay | Admitting: Internal Medicine

## 2023-05-22 ENCOUNTER — Other Ambulatory Visit (HOSPITAL_COMMUNITY): Payer: Self-pay

## 2023-05-22 NOTE — Telephone Encounter (Signed)
05/22/23 - Pt called saying she wants her medication Rx #: [id number]  tirzepatide (ZEPBOUND) to remain at 7.5 mg and wants to discontinue the 10 mg.

## 2023-05-23 ENCOUNTER — Other Ambulatory Visit (HOSPITAL_COMMUNITY): Payer: Self-pay

## 2023-05-23 ENCOUNTER — Other Ambulatory Visit: Payer: Self-pay

## 2023-05-23 MED ORDER — ONDANSETRON HCL 4 MG PO TABS
4.0000 mg | ORAL_TABLET | Freq: Three times a day (TID) | ORAL | 0 refills | Status: DC | PRN
  Filled 2023-05-23: qty 20, 7d supply, fill #0

## 2023-05-23 MED ORDER — TIRZEPATIDE-WEIGHT MANAGEMENT 7.5 MG/0.5ML ~~LOC~~ SOAJ
7.5000 mg | SUBCUTANEOUS | 1 refills | Status: DC
  Filled 2023-05-23: qty 2, 28d supply, fill #0
  Filled ????-??-?? (×2): fill #0

## 2023-05-23 NOTE — Telephone Encounter (Signed)
Zepbound 10 mg was discontinued and Zepbound 7.5 was sent to the pharmacy

## 2023-05-23 NOTE — Addendum Note (Signed)
Addended by: Mary Sella D on: 05/23/2023 10:22 AM   Modules accepted: Orders

## 2023-05-23 NOTE — Telephone Encounter (Signed)
Error

## 2023-05-24 ENCOUNTER — Other Ambulatory Visit (HOSPITAL_COMMUNITY): Payer: Self-pay

## 2023-05-25 ENCOUNTER — Other Ambulatory Visit (HOSPITAL_COMMUNITY): Payer: Self-pay

## 2023-05-30 ENCOUNTER — Other Ambulatory Visit (HOSPITAL_COMMUNITY): Payer: Self-pay

## 2023-05-30 ENCOUNTER — Telehealth: Payer: Self-pay

## 2023-05-30 NOTE — Telephone Encounter (Signed)
PA request has been Submitted. New Encounter created for follow up. For additional info see Pharmacy Prior Auth telephone encounter from 10/29.

## 2023-05-30 NOTE — Telephone Encounter (Signed)
*  Primary  Pharmacy Patient Advocate Encounter   Received notification from Patient Advice Request messages that prior authorization for Zepbound 7.5MG /0.5ML pen-injectors  is required/requested.   Insurance verification completed.   The patient is insured through [facility] .   Per test claim: PA required; PA started via CoverMyMeds. KEY [id number] . Waiting for clinical questions to populate.

## 2023-05-31 NOTE — Telephone Encounter (Signed)
Noted  

## 2023-06-01 ENCOUNTER — Other Ambulatory Visit (HOSPITAL_COMMUNITY): Payer: Self-pay

## 2023-06-02 DIAGNOSIS — G4733 Obstructive sleep apnea (adult) (pediatric): Secondary | ICD-10-CM | POA: Diagnosis not present

## 2023-06-02 NOTE — Telephone Encounter (Signed)
Clinical questions populated, submitted.

## 2023-06-05 DIAGNOSIS — R070 Pain in throat: Secondary | ICD-10-CM | POA: Diagnosis not present

## 2023-06-05 DIAGNOSIS — J040 Acute laryngitis: Secondary | ICD-10-CM | POA: Diagnosis not present

## 2023-06-05 DIAGNOSIS — J028 Acute pharyngitis due to other specified organisms: Secondary | ICD-10-CM | POA: Diagnosis not present

## 2023-06-05 DIAGNOSIS — R0981 Nasal congestion: Secondary | ICD-10-CM | POA: Diagnosis not present

## 2023-06-07 ENCOUNTER — Other Ambulatory Visit (HOSPITAL_COMMUNITY): Payer: Self-pay

## 2023-06-07 ENCOUNTER — Other Ambulatory Visit: Payer: Self-pay

## 2023-06-07 ENCOUNTER — Telehealth: Payer: Self-pay | Admitting: Internal Medicine

## 2023-06-07 MED ORDER — TIRZEPATIDE-WEIGHT MANAGEMENT 7.5 MG/0.5ML ~~LOC~~ SOAJ: 7.5000 mg | SUBCUTANEOUS | 1 refills | Status: DC

## 2023-06-07 NOTE — Telephone Encounter (Signed)
Pt is asking for the PA on her  Rx #: [id number]  tirzepatide (ZEPBOUND) 7.5 MG/0.5ML Pen [[id number]]. She has been waiting for awhile.  [id number] [place], [place] - [address] AT [facility] [address] [place] [address] Phone: [phone]  Fax: [phone]

## 2023-06-07 NOTE — Telephone Encounter (Signed)
Rx sent to the pharmacy.

## 2023-06-08 NOTE — Telephone Encounter (Signed)
Additional information has been requested from the patient's insurance in order to proceed with the prior authorization request. Requested information has been sent, or form has been filled out and faxed back to [phone]

## 2023-06-09 NOTE — Telephone Encounter (Signed)
Additional information has been requested from the patient's insurance in order to proceed with the prior authorization request. Requested information has been sent, or form has been filled out and faxed back to [phone]

## 2023-06-12 NOTE — Telephone Encounter (Signed)
Pharmacy Patient Advocate Encounter  Received notification from [facility] that Prior Authorization for ZEPBOUND has been DENIED.  Full denial letter will be uploaded to the media tab. See denial reason below.   PA #/Case ID/Reference #: [id number]

## 2023-06-16 ENCOUNTER — Telehealth: Payer: Self-pay | Admitting: Internal Medicine

## 2023-06-16 NOTE — Telephone Encounter (Signed)
Pt called wanting to speak with the nurse. Says Insurance is waiting for the pcp to send her weight to them. She wants a call back at [phone] to further discuss this

## 2023-06-16 NOTE — Telephone Encounter (Signed)
PA request has been Denied. New Encounter created for follow up. For additional info see Pharmacy Prior Auth telephone encounter from 05/30/23.

## 2023-06-19 ENCOUNTER — Ambulatory Visit (INDEPENDENT_AMBULATORY_CARE_PROVIDER_SITE_OTHER): Payer: Medicaid Other | Admitting: Internal Medicine

## 2023-06-19 ENCOUNTER — Encounter: Payer: Self-pay | Admitting: Internal Medicine

## 2023-06-19 DIAGNOSIS — G4733 Obstructive sleep apnea (adult) (pediatric): Secondary | ICD-10-CM

## 2023-06-19 DIAGNOSIS — Z0000 Encounter for general adult medical examination without abnormal findings: Secondary | ICD-10-CM | POA: Diagnosis not present

## 2023-06-19 LAB — COMPREHENSIVE METABOLIC PANEL
ALT: 14 U/L (ref 0–35)
AST: 13 U/L (ref 0–37)
Albumin: 4.2 g/dL (ref 3.5–5.2)
Alkaline Phosphatase: 71 U/L (ref 39–117)
BUN: 11 mg/dL (ref 6–23)
CO2: 26 meq/L (ref 19–32)
Calcium: 9.4 mg/dL (ref 8.4–10.5)
Chloride: 104 meq/L (ref 96–112)
Creatinine, Ser: 0.77 mg/dL (ref 0.40–1.20)
GFR: 99.79 mL/min (ref >60.00)
Glucose, Bld: 82 mg/dL (ref 70–99)
Potassium: 4.4 meq/L (ref 3.5–5.1)
Sodium: 140 meq/L (ref 135–145)
Total Bilirubin: 0.3 mg/dL (ref 0.2–1.2)
Total Protein: 6.8 g/dL (ref 6.0–8.3)

## 2023-06-19 LAB — CBC WITH DIFFERENTIAL/PLATELET
Basophils Absolute: 0 10*3/uL (ref 0.0–0.1)
Basophils Relative: 0.4 % (ref 0.0–3.0)
Eosinophils Absolute: 0.1 10*3/uL (ref 0.0–0.7)
Eosinophils Relative: 1.5 % (ref 0.0–5.0)
HCT: 40.3 % (ref 36.0–46.0)
Hemoglobin: 13 g/dL (ref 12.0–15.0)
Lymphocytes Relative: 21.3 % (ref 12.0–46.0)
Lymphs Abs: 2.1 10*3/uL (ref 0.7–4.0)
MCHC: 32.2 g/dL (ref 30.0–36.0)
MCV: 80.7 fL (ref 78.0–100.0)
Monocytes Absolute: 0.6 10*3/uL (ref 0.1–1.0)
Monocytes Relative: 5.7 % (ref 3.0–12.0)
Neutro Abs: 7.2 10*3/uL (ref 1.4–7.7)
Neutrophils Relative %: 71.1 % (ref 43.0–77.0)
Platelets: 335 10*3/uL (ref 150.0–400.0)
RBC: 5 Mil/uL (ref 3.87–5.11)
RDW: 15.4 % (ref 11.5–15.5)
WBC: 10.1 10*3/uL (ref 4.0–10.5)

## 2023-06-19 LAB — LIPID PANEL
Cholesterol: 139 mg/dL (ref 0–200)
HDL: 32.5 mg/dL — ABNORMAL LOW (ref >39.00)
LDL Cholesterol: 76 mg/dL (ref 0–99)
NonHDL: 106.97
Total CHOL/HDL Ratio: 4
Triglycerides: 155 mg/dL — ABNORMAL HIGH (ref 0.0–149.0)
VLDL: 31 mg/dL (ref 0.0–40.0)

## 2023-06-19 LAB — TSH: TSH: 1.37 u[IU]/mL (ref 0.35–5.50)

## 2023-06-19 MED ORDER — TIRZEPATIDE-WEIGHT MANAGEMENT 2.5 MG/0.5ML ~~LOC~~ SOLN: 2.5000 mg | SUBCUTANEOUS | 0 refills | Status: DC

## 2023-06-19 NOTE — Progress Notes (Signed)
[facility] [address] [place] [address] Dept: [phone] Dept Fax: [phone]  Acute Care Office Visit  Subjective:   Deborah Mcbride Aug 19, 1985 06/19/2023  Chief Complaint  Patient presents with   Medication Refill    HPI: Discussed the use of AI scribe software for clinical note transcription with the patient, who gave verbal consent to proceed.  History of Present Illness   The patient, with a history of sleep apnea and morbid obesity, presents with ongoing struggles with weight loss. She reports that her insurance has  recently denied coverage for her weight loss medication, Zepbound 7.5mg , due to insufficient weight loss of 5% from starting weight.  She has been without medication for the past month. She wants to re-start at beginning dose .The patient has previously tried [place] for weight loss, but found it ineffective. She reports recent stress due to a family member's surgery and changes at her job, which she believes may have contributed to her weight gain. The patient has been trying to make dietary changes, including increasing her intake of fruits and vegetables, but admits to often not feeling hungry and having to force herself to eat. She also reports trying to increase her physical activity by walking more. The patient expresses frustration with her weight loss progress and is eager to make further changes to improve her health.      Prior records reviewed, on 07/07/22 at [facility] patient's weight was 330lbs. On 08/03/22 showed weight of 326lbs.   Patient states at another PCP office, her initial starting weight was 350lbs, however, I am unable to see those records.  Wt Readings from Last 3 Encounters:  06/19/23 (!) 312 lb 12.8 oz (141.9 kg)  04/10/23 (!) 309 lb 12.8 oz (140.5 kg)  01/19/23 (!) 318 lb (144.2 kg)     The following portions of the patient's history were reviewed and updated  as appropriate: past medical history, past surgical history, family history, social history, allergies, medications, and problem list.   Patient Active Problem List   Diagnosis Date Noted   Hepatosplenomegaly 04/10/2023   Nasal congestion 01/19/2023   Sinus tachycardia 12/07/2022   OSA (obstructive sleep apnea) 10/21/2022   BMI 50.0-59.9, adult (HCC) 08/03/2022   Morbid obesity (HCC) 07/07/2022   OSA treated with BiPAP 07/07/2022   Gastroesophageal reflux disease without esophagitis 07/07/2022   Past Medical History:  Diagnosis Date   Obesity    Past Surgical History:  Procedure Laterality Date   CESAREAN SECTION     CHOLECYSTECTOMY     TONSILLECTOMY     TUBAL LIGATION     History reviewed. No pertinent family history.  Current Outpatient Medications:    famotidine (PEPCID) 40 MG tablet, Take 1 tablet (40 mg total) by mouth daily. (Patient taking differently: Take 40 mg by mouth as needed.), Disp: 90 tablet, Rfl: 1   ibuprofen (ADVIL) 800 MG tablet, Take 1 tablet (800 mg total) by mouth every 8 (eight) hours as needed for moderate pain., Disp: 30 tablet, Rfl: 1   ondansetron (ZOFRAN) 4 MG tablet, Take 1 tablet (4 mg total) by mouth every 8 (eight) hours as needed for nausea or vomiting., Disp: 20 tablet, Rfl: 0   tirzepatide (ZEPBOUND) 2.5 MG/0.5ML injection vial, Inject 2.5 mg into the skin once a week., Disp: 2 mL, Rfl: 0   cholecalciferol (VITAMIN D3) 25 MCG (1000 UNIT) tablet, Take 1 tablet (1,000 Units total) by mouth daily. (Patient not taking: Reported  on 04/10/2023), Disp: 90 tablet, Rfl: 1 Allergies  Allergen Reactions   Fentanyl Shortness Of Breath, Itching and Nausea And Vomiting    rash   Hydromorphone Itching   Dilaudid [Hydromorphone Hcl] Itching     ROS: A complete ROS was performed with pertinent positives/negatives noted in the HPI. The remainder of the ROS are negative.    Objective:   Today's Vitals   06/19/23 0929  BP: 120/74  Pulse: 69  Temp: 98.3  F (36.8 C)  TempSrc: Temporal  SpO2: 98%  Weight: (!) 312 lb 12.8 oz (141.9 kg)  Height: 5\' 3"  (1.6 m)    GENERAL: Well-appearing, in NAD. Well nourished.  SKIN: Pink, warm and dry. RESPIRATORY: Respirations even and non-labored.  NEUROLOGIC:  Steady, even gait.  PSYCH/MENTAL STATUS: Alert, oriented x 3. Cooperative, appropriate mood and affect.    No results found for any visits on 06/19/23.    Assessment & Plan:  Assessment and Plan    Morbid Obesity with serious co-morbidity of OSA  - Will resubmit prescription request for restart of Zepbound at 2.5mg  after adding her further weight progress today. Patient is aware that insurance may still not cover this. -[facility]erral to [facility] for comprehensive weight management, including nutritional counseling and metabolic testing. -Plan to follow up in 6 months.  Routine Health Maintenance Pending lab work from physical in September, lab was unable to get blood work at Sept appt and patient did not go to [facility]. patient is fasting today, will complete today.     Meds ordered this encounter  Medications   tirzepatide (ZEPBOUND) 2.5 MG/0.5ML injection vial    Sig: Inject 2.5 mg into the skin once a week.    Dispense:  2 mL    Refill:  0    Order Specific Question:   Supervising Provider    Answer:   Garnette Gunner [[id number]]   Orders Placed This Encounter  Procedures   Amb Ref to Medical Weight Management    Referral Priority:   Routine    Referral Type:   Consultation    Number of Visits Requested:   1   Lab Orders  No laboratory test(s) ordered today   No images are attached to the encounter or orders placed in the encounter.  Return in about 6 months (around 12/17/2023) for Scheduled Routine Office Visits and as needed.   Salvatore Decent, FNP

## 2023-07-03 ENCOUNTER — Telehealth: Payer: Self-pay

## 2023-07-03 ENCOUNTER — Other Ambulatory Visit (HOSPITAL_COMMUNITY): Payer: Self-pay

## 2023-07-03 NOTE — Telephone Encounter (Signed)
Pharmacy Patient Advocate Encounter   Received notification from Fax that prior authorization for Zepbound 2.5MG /0.5ML pen-injectors is required/requested.   Insurance verification completed.   The patient is insured through [facility] .   Per test claim: PA required; PA submitted to above mentioned insurance via CoverMyMeds Key/confirmation #/EOC [facility] Status is pending

## 2023-07-04 NOTE — Telephone Encounter (Signed)
PA resubmitted. New key: [id number]

## 2023-07-05 NOTE — Telephone Encounter (Signed)
Pharmacy Patient Advocate Encounter  Received notification from [facility] that Prior Authorization for Zepbound 2.5MG /0.5ML pen-injectors  has been DENIED.  Full denial letter will be uploaded to the media tab. See denial reason below.   PA #/Case ID/Reference #: [id number]

## 2023-07-06 ENCOUNTER — Ambulatory Visit: Payer: BC Managed Care – PPO | Admitting: Nurse Practitioner

## 2023-07-06 ENCOUNTER — Telehealth: Payer: Self-pay | Admitting: Internal Medicine

## 2023-07-06 ENCOUNTER — Encounter: Payer: Self-pay | Admitting: Nurse Practitioner

## 2023-07-06 VITALS — BP 139/80 | HR 67 | Temp 98.1°F | Ht 63.0 in | Wt 314.0 lb

## 2023-07-06 DIAGNOSIS — Z0289 Encounter for other administrative examinations: Secondary | ICD-10-CM

## 2023-07-06 DIAGNOSIS — G4733 Obstructive sleep apnea (adult) (pediatric): Secondary | ICD-10-CM | POA: Diagnosis not present

## 2023-07-06 DIAGNOSIS — Z6841 Body Mass Index (BMI) 40.0 and over, adult: Secondary | ICD-10-CM

## 2023-07-06 NOTE — Progress Notes (Signed)
Office: [phone]  /  Fax: [phone]   Initial Visit  SUELYNN QUIRKE was seen in clinic today to evaluate for obesity. She is interested in losing weight to improve overall health and reduce the risk of weight related complications. She presents today to review program treatment options, initial physical assessment, and evaluation.     She was referred by: PCP  When asked what else they would like to accomplish? She states: Adopt healthier eating patterns, Improve quality of life, and Improve appearance   When asked how has your weight affected you? She states: Contributed to medical problems, Contributed to orthopedic problems or mobility issues, Having fatigue, and Having poor endurance  Some associated conditions: OSAS on CPAP, GERD, hepatosplenomegaly, tachycardia  Contributing factors: Family history of obesity, Moderate to high levels of stress, and Reduced physical activity  Weight promoting medications identified: None  Current nutrition plan: None  Current level of physical activity: Walking  Current or previous pharmacotherapy: Phentermine (tachycardia), Wegovy (diarrhea), recently tried Zepbound 2.5mg -denied side effects-PA recently denied   Response to medication: Lost weight initially but was unable to sustain weight loss   Past medical history includes:   Past Medical History:  Diagnosis Date   Obesity      Objective:   BP 139/80   Pulse 67   Temp 98.1 F (36.7 C)   Ht 5\' 3"  (1.6 m)   Wt (!) 314 lb (142.4 kg)   LMP 06/24/2023 (Approximate)   SpO2 96%   BMI 55.62 kg/m  She was weighed on the bioimpedance scale: Body mass index is 55.62 kg/m.  Peak Weight:360 lbs , Body Fat%:53.7, Visceral Fat Rating:20, Weight trend over the last 12 months: Unchanged  General:  Alert, oriented and cooperative. Patient is in no acute distress.  Respiratory: Normal respiratory effort, no problems with respiration noted   Gait: able to ambulate independently   Mental Status: Normal mood and affect. Normal behavior. Normal judgment and thought content.   DIAGNOSTIC DATA REVIEWED:  BMET    Component Value Date/Time   NA 140 06/19/2023 0959   K 4.4 06/19/2023 0959   CL 104 06/19/2023 0959   CO2 26 06/19/2023 0959   GLUCOSE 82 06/19/2023 0959   BUN 11 06/19/2023 0959   CREATININE 0.77 06/19/2023 0959   CALCIUM 9.4 06/19/2023 0959   GFRNONAA >60 08/08/2022 0940   GFRAA >60 03/30/2019 1138   No results found for: "HGBA1C" No results found for: "INSULIN" CBC    Component Value Date/Time   WBC 10.1 06/19/2023 0959   RBC 5.00 06/19/2023 0959   HGB 13.0 06/19/2023 0959   HCT 40.3 06/19/2023 0959   PLT 335.0 06/19/2023 0959   MCV 80.7 06/19/2023 0959   MCH 25.3 (L) 08/08/2022 0940   MCHC 32.2 06/19/2023 0959   RDW 15.4 06/19/2023 0959   Iron/TIBC/Ferritin/ %Sat No results found for: "IRON", "TIBC", "FERRITIN", "IRONPCTSAT" Lipid Panel     Component Value Date/Time   CHOL 139 06/19/2023 0959   TRIG 155.0 (H) 06/19/2023 0959   HDL 32.50 (L) 06/19/2023 0959   CHOLHDL 4 06/19/2023 0959   VLDL 31.0 06/19/2023 0959   LDLCALC 76 06/19/2023 0959   Hepatic Function Panel     Component Value Date/Time   PROT 6.8 06/19/2023 0959   ALBUMIN 4.2 06/19/2023 0959   AST 13 06/19/2023 0959   ALT 14 06/19/2023 0959   ALKPHOS 71 06/19/2023 0959   BILITOT 0.3 06/19/2023 0959      Component Value Date/Time  TSH 1.37 06/19/2023 0959     Assessment and Plan:   OSA (obstructive sleep apnea) Continue CPAP nightly  Morbid obesity (HCC)  BMI 50.0-59.9, adult (HCC)        Obesity Treatment / Action Plan:  Patient will work on garnering support from family and friends to begin weight loss journey. Will work on eliminating or reducing the presence of highly palatable, calorie dense foods in the home. Will complete provided nutritional and psychosocial assessment questionnaire before the next appointment. Will be scheduled for  indirect calorimetry to determine resting energy expenditure in a fasting state.  This will allow [place] to create a reduced calorie, high-protein meal plan to promote loss of fat mass while preserving muscle mass. Counseled on the health benefits of losing 5%-15% of total body weight. Was counseled on nutritional approaches to weight loss and benefits of reducing processed foods and consuming plant-based foods and high quality protein as part of nutritional weight management. Was counseled on pharmacotherapy and role as an adjunct in weight management.   Obesity Education Performed Today:  She was weighed on the bioimpedance scale and results were discussed and documented in the synopsis.  We discussed obesity as a disease and the importance of a more detailed evaluation of all the factors contributing to the disease.  We discussed the importance of long term lifestyle changes which include nutrition, exercise and behavioral modifications as well as the importance of customizing this to her specific health and social needs.  We discussed the benefits of reaching a healthier weight to alleviate the symptoms of existing conditions and reduce the risks of the biomechanical, metabolic and psychological effects of obesity.  Neta Mends appears to be in the action stage of change and states they are ready to start intensive lifestyle modifications and behavioral modifications.  30 minutes was spent today on this visit including the above counseling, pre-visit chart review, and post-visit documentation.  Reviewed by clinician on day of visit: allergies, medications, problem list, medical history, surgical history, family history, social history, and previous encounter notes pertinent to obesity diagnosis.    Theodis Sato Lakeesha Fontanilla FNP-C

## 2023-07-06 NOTE — Telephone Encounter (Signed)
Pt calld wanting to speak with the cma regarding prior authorization for her zepbound. She wants a call back at [phone]

## 2023-07-10 DIAGNOSIS — R102 Pelvic and perineal pain: Secondary | ICD-10-CM | POA: Diagnosis not present

## 2023-07-10 DIAGNOSIS — M7918 Myalgia, other site: Secondary | ICD-10-CM | POA: Diagnosis not present

## 2023-07-10 DIAGNOSIS — N939 Abnormal uterine and vaginal bleeding, unspecified: Secondary | ICD-10-CM | POA: Diagnosis not present

## 2023-07-10 DIAGNOSIS — Z6841 Body Mass Index (BMI) 40.0 and over, adult: Secondary | ICD-10-CM | POA: Diagnosis not present

## 2023-07-11 DIAGNOSIS — G4733 Obstructive sleep apnea (adult) (pediatric): Secondary | ICD-10-CM | POA: Diagnosis not present

## 2023-07-12 DIAGNOSIS — G4733 Obstructive sleep apnea (adult) (pediatric): Secondary | ICD-10-CM | POA: Diagnosis not present

## 2023-07-14 DIAGNOSIS — G4733 Obstructive sleep apnea (adult) (pediatric): Secondary | ICD-10-CM | POA: Diagnosis not present

## 2023-07-18 ENCOUNTER — Other Ambulatory Visit: Payer: Self-pay | Admitting: Internal Medicine

## 2023-07-18 DIAGNOSIS — K219 Gastro-esophageal reflux disease without esophagitis: Secondary | ICD-10-CM

## 2023-07-27 ENCOUNTER — Encounter: Payer: Self-pay | Admitting: Bariatrics

## 2023-07-27 ENCOUNTER — Ambulatory Visit (INDEPENDENT_AMBULATORY_CARE_PROVIDER_SITE_OTHER): Payer: BC Managed Care – PPO | Admitting: Bariatrics

## 2023-07-27 VITALS — BP 110/63 | HR 71 | Temp 97.5°F | Ht 63.0 in | Wt 320.0 lb

## 2023-07-27 DIAGNOSIS — K219 Gastro-esophageal reflux disease without esophagitis: Secondary | ICD-10-CM

## 2023-07-27 DIAGNOSIS — Z1331 Encounter for screening for depression: Secondary | ICD-10-CM

## 2023-07-27 DIAGNOSIS — Z0000 Encounter for general adult medical examination without abnormal findings: Secondary | ICD-10-CM | POA: Diagnosis not present

## 2023-07-27 DIAGNOSIS — R7309 Other abnormal glucose: Secondary | ICD-10-CM

## 2023-07-27 DIAGNOSIS — R5383 Other fatigue: Secondary | ICD-10-CM | POA: Diagnosis not present

## 2023-07-27 DIAGNOSIS — G4733 Obstructive sleep apnea (adult) (pediatric): Secondary | ICD-10-CM

## 2023-07-27 DIAGNOSIS — E559 Vitamin D deficiency, unspecified: Secondary | ICD-10-CM

## 2023-07-27 DIAGNOSIS — E781 Pure hyperglyceridemia: Secondary | ICD-10-CM | POA: Diagnosis not present

## 2023-07-27 DIAGNOSIS — Z6841 Body Mass Index (BMI) 40.0 and over, adult: Secondary | ICD-10-CM | POA: Diagnosis not present

## 2023-07-27 DIAGNOSIS — E538 Deficiency of other specified B group vitamins: Secondary | ICD-10-CM | POA: Diagnosis not present

## 2023-07-27 DIAGNOSIS — E88819 Insulin resistance, unspecified: Secondary | ICD-10-CM | POA: Diagnosis not present

## 2023-07-27 DIAGNOSIS — R0602 Shortness of breath: Secondary | ICD-10-CM

## 2023-07-27 NOTE — Progress Notes (Signed)
At a Glance:  Vitals Temp: (!) 97.5 F (36.4 C) BP: 110/63 Pulse Rate: 71 SpO2: 99 %   Anthropometric Measurements Height: 5\' 3"  (1.6 m) Weight: (!) 320 lb (145.2 kg) BMI (Calculated): 56.7 Starting Weight: 320lb Peak Weight: 350lb   Body Composition  Body Fat %: 55 % Fat Mass (lbs): 176 lbs Muscle Mass (lbs): 137 lbs Total Body Water (lbs): 11.2 lbs Visceral Fat Rating : 21   Other Clinical Data Fasting: yes Labs: yes Today's Visit #: 1 Starting Date: 07/27/23    EKG: Normal sinus rhythm, rate 67.  Indirect Calorimeter:   Resting Metabolic Rate ( RMR):  RMR (actual): 2477 kcal RMR (calculated): 2147 kcal The calculated basal metabolic rate is 4,132 thus her basal metabolic rate is better than expected.  Plan:   Indirect calorimeter completed, interpreted and reviewed with patient today and allowed to ask questions.  Discussed the implications for the chosen plan and exercise based on the RMR reading.  Will consider repeating the RMR in the future based on weight loss.    Chief Complaint:  Obesity   Subjective:  Deborah Mcbride (MR# [id number]) is a 37 y.o. female who presents for evaluation and treatment of obesity and related comorbidities.   Deborah Mcbride is currently in the action stage of change and ready to dedicate time achieving and maintaining a healthier weight. Deborah Mcbride is interested in becoming our patient and working on intensive lifestyle modifications including (but not limited to) diet and exercise for weight loss.  [name] has been struggling with her weight. She has been unsuccessful in either losing weight, maintaining weight loss, or reaching her healthy weight goal.  Deborah Mcbride's habits were reviewed today and are as follows: Her family eats meals together, she thinks her family will eat healthier with her, she started gaining weight with the birth of her children, she is a picky eater and doesn't like to eat healthier foods, she has  significant food cravings issues, she snacks frequently in the evenings, she skips meals frequently, and she has problems with excessive hunger.   She started gaining weight after her children.   Current or previous pharmacotherapy: Other: Wegovy, Zepbound, and Adipex  Response to medication: Lost weight initially but was unable to sustain weight loss  Other Fatigue Jimi admits to daytime somnolence and admits to waking up still tired. Patient has a history of symptoms of daytime fatigue. Deborah Mcbride generally gets 5 or 6 hours of sleep per night, and states that she has difficulty falling asleep. Snoring is present. Apneic episodes is present. Epworth Sleepiness Score is 15.   Shortness of Breath Anea notes increasing shortness of breath with exercising and seems to be worsening over time with weight gain. She notes getting out of breath sooner with activity than she used to. This has gotten worse recently. Deborah Mcbride denies shortness of breath at rest or orthopnea.  Depression Screen Deborah Mcbride's Food and Mood (modified PHQ-9) score was 15. 15-19 moderate severe depression     06/19/2023    9:29 AM  Depression screen PHQ 2/9  Decreased Interest 0  Down, Depressed, Hopeless 0  PHQ - 2 Score 0     Assessment and Plan:   Other Fatigue Deborah Mcbride does not feel that her weight is causing her energy to be lower than it should be. Fatigue may be related to obesity, depression or many other causes. Labs will be ordered, and in the meanwhile, Deborah Mcbride will focus on self care including making healthy food choices, increasing physical  activity and focusing on stress reduction.  Shortness of Breath Deborah Mcbride does feel that she gets out of breath more easily that she used to when she exercises. Deborah Mcbride's shortness of breath appears to be obesity related and exercise induced. She has agreed to work on weight loss and gradually increase exercise to treat her exercise induced shortness of breath. Will  continue to monitor closely.  Health Maintenance:   Obesity   Plan: Will do EKG, indirect calorimetry, and labs.     Vitamin D Deficiency She is at risk for vitamin D deficiency due to obesity.  She is not on vitamin D  Plan: Will check for vitamin D deficiency.   Deborah Mcbride had a positive depression screening. Depression is commonly associated with obesity and often results in emotional eating behaviors. We will monitor this closely and work on CBT to help improve the non-hunger eating patterns. Referral to Psychology may be required if no improvement is seen as she continues in our clinic.   Obstructive Sleep Apnea Deborah Mcbride has a diagnosis of sleep apnea. She reports that she is using a CPAP regularly. Reports less than restful sleep.   Plan: Continue CPAP therapy. Continue to practice good sleep hygiene.  Will begin the diet and exercise.  Reduce intake of carbohydrates for weight loss.   Gastroesophageal reflux disease without esophagitis Symptoms are well-controlled. Medication: Pepcid  Plan: Continue PPI. Use antacids such as Tums or Mylanta as needed. May use OTC Pepcid along with PPI if needed. Avoid trigger foods (spicy foods, fatty/fried foods, acidic foods, coffee, alcohol). Avoid eating within 3 hours of bedtime. Continue to work on weight loss.      B 12 deficiency:   She is not taking B12 .She is unsure if she has a history of B12 deficiency.   Plan:  Check B 12 lab today.   Hypertriglyceridemia:  LDL is at goal. Triglycerides are elevated  Medication(s): none Cardiovascular risk factors: dyslipidemia, obesity (BMI >= 30 kg/m2), and sedentary lifestyle  Lab Results  Component Value Date   CHOL 139 06/19/2023   HDL 32.50 (L) 06/19/2023   LDLCALC 76 06/19/2023   TRIG 155.0 (H) 06/19/2023   CHOLHDL 4 06/19/2023   Lab Results  Component Value Date   ALT 14 06/19/2023   AST 13 06/19/2023   ALKPHOS 71 06/19/2023   BILITOT 0.3 06/19/2023   The  ASCVD Risk score (Arnett DK, et al., 2019) failed to calculate for the following reasons:   The 2019 ASCVD risk score is only valid for ages 42 to 29  Plan:  Will avoid all trans fats.  Will read labels Will minimize saturated fats except the following: low fat meats in moderation, diary, and limited dark chocolate.    Elevated glucose:   She states that she has a family history of diabetes.   Plan: Will check her HgbA1c and insulin.   Previous labs reviewed today. Date: 06/19/2023 (TSH, CBC, Lipids and CBC)  Labs done today CMP, Insulin, HgbA1c, Vit D, Vit B12, and TSH  Also CBC   Morbid Obesity: BMI (Calculated): 56.7   Soni is currently in the action stage of change and her goal is to begin weight loss efforts. I recommend Mouna begin the structured treatment plan as follows:  She has agreed to Category 3 Plan  Exercise goals: For substantial health benefits, adults should do at least 150 minutes (2 hours and 30 minutes) a week of moderate-intensity, or 75 minutes (1 hour and 15 minutes) a week of  vigorous-intensity aerobic physical activity, or an equivalent combination of moderate- and vigorous-intensity aerobic activity. Aerobic activity should be performed in episodes of at least 10 minutes, and preferably, it should be spread throughout the week.  Behavioral modification strategies:increasing lean protein intake, increasing vegetables, increase H2O intake, no skipping meals, better snacking choices, avoiding temptations, and planning for success  She was informed of the importance of frequent follow-up visits to maximize her success with intensive lifestyle modifications for her multiple health conditions. She was informed we would discuss her lab results at her next visit unless there is a critical issue that needs to be addressed sooner. Bevely agreed to keep her next visit at the agreed upon time to discuss these results.  Objective:  General: Cooperative, alert,  well developed, in no acute distress. HEENT: Conjunctivae and lids unremarkable. Cardiovascular: Regular rhythm.  Lungs: Normal work of breathing. Neurologic: No focal deficits.   Lab Results  Component Value Date   CREATININE 0.77 06/19/2023   BUN 11 06/19/2023   NA 140 06/19/2023   K 4.4 06/19/2023   CL 104 06/19/2023   CO2 26 06/19/2023   Lab Results  Component Value Date   ALT 14 06/19/2023   AST 13 06/19/2023   ALKPHOS 71 06/19/2023   BILITOT 0.3 06/19/2023   No results found for: "HGBA1C" No results found for: "INSULIN" Lab Results  Component Value Date   TSH 1.37 06/19/2023   Lab Results  Component Value Date   CHOL 139 06/19/2023   HDL 32.50 (L) 06/19/2023   LDLCALC 76 06/19/2023   TRIG 155.0 (H) 06/19/2023   CHOLHDL 4 06/19/2023   Lab Results  Component Value Date   WBC 10.1 06/19/2023   HGB 13.0 06/19/2023   HCT 40.3 06/19/2023   MCV 80.7 06/19/2023   PLT 335.0 06/19/2023   No results found for: "IRON", "TIBC", "FERRITIN"  Attestation Statements:  Applicable history such as the following:  allergies, medications, problem list, medical history, surgical history, family history, social history, and previous encounter notes reviewed by clinician on day of visit:  Time spent on visit including the items listed below was 54 minutes.  -preparing to see the patient (e.g., review of tests, history, previous notes) -obtaining and/or reviewing separately obtained history -counseling and educating the patient/family/caregiver -documenting clinical information in the electronic or other health record -ordering medications, tests, or procedures -independently interpreting results and communicating results to the patient/ family/caregiver -referring and communicating with other health care professionals  -care coordination   This may have been prepared with the assistance of Engineer, civil (consulting).  Occasional wrong-word or sound-a-like substitutions may have  occurred due to the inherent limitations of voice recognition software.    Corinna Capra, DO

## 2023-07-28 LAB — CBC WITH DIFFERENTIAL/PLATELET
Basophils Absolute: 0.1 10*3/uL (ref 0.0–0.2)
Basos: 1 %
EOS (ABSOLUTE): 0.2 10*3/uL (ref 0.0–0.4)
Eos: 2 %
Hematocrit: 42.8 % (ref 34.0–46.6)
Hemoglobin: 13.3 g/dL (ref 11.1–15.9)
Immature Grans (Abs): 0.1 10*3/uL (ref 0.0–0.1)
Immature Granulocytes: 2 %
Lymphocytes Absolute: 2.4 10*3/uL (ref 0.7–3.1)
Lymphs: 28 %
MCH: 25.6 pg — ABNORMAL LOW (ref 26.6–33.0)
MCHC: 31.1 g/dL — ABNORMAL LOW (ref 31.5–35.7)
MCV: 83 fL (ref 79–97)
Monocytes Absolute: 0.4 10*3/uL (ref 0.1–0.9)
Monocytes: 5 %
Neutrophils Absolute: 5.4 10*3/uL (ref 1.4–7.0)
Neutrophils: 62 %
Platelets: 342 10*3/uL (ref 150–450)
RBC: 5.19 x10E6/uL (ref 3.77–5.28)
RDW: 14.5 % (ref 11.7–15.4)
WBC: 8.6 10*3/uL (ref 3.4–10.8)

## 2023-07-28 LAB — COMPREHENSIVE METABOLIC PANEL
ALT: 11 [IU]/L (ref 0–32)
AST: 13 [IU]/L (ref 0–40)
Albumin: 4.5 g/dL (ref 3.9–4.9)
Alkaline Phosphatase: 91 [IU]/L (ref 44–121)
BUN/Creatinine Ratio: 17 (ref 9–23)
BUN: 12 mg/dL (ref 6–20)
Bilirubin Total: 0.2 mg/dL (ref 0.0–1.2)
CO2: 21 mmol/L (ref 20–29)
Calcium: 9.3 mg/dL (ref 8.7–10.2)
Chloride: 103 mmol/L (ref 96–106)
Creatinine, Ser: 0.69 mg/dL (ref 0.57–1.00)
Globulin, Total: 2.7 g/dL (ref 1.5–4.5)
Glucose: 88 mg/dL (ref 70–99)
Potassium: 4.7 mmol/L (ref 3.5–5.2)
Sodium: 138 mmol/L (ref 134–144)
Total Protein: 7.2 g/dL (ref 6.0–8.5)
eGFR: 116 mL/min/{1.73_m2} (ref >59)

## 2023-07-28 LAB — LIPID PANEL WITH LDL/HDL RATIO
Cholesterol, Total: 181 mg/dL (ref 100–199)
HDL: 42 mg/dL (ref >39)
LDL Chol Calc (NIH): 106 mg/dL — ABNORMAL HIGH (ref 0–99)
LDL/HDL Ratio: 2.5 {ratio} (ref 0.0–3.2)
Triglycerides: 192 mg/dL — ABNORMAL HIGH (ref 0–149)
VLDL Cholesterol Cal: 33 mg/dL (ref 5–40)

## 2023-07-28 LAB — HEMOGLOBIN A1C
Est. average glucose Bld gHb Est-mCnc: 111 mg/dL
Hgb A1c MFr Bld: 5.5 % (ref 4.8–5.6)

## 2023-07-28 LAB — TSH: TSH: 2.17 u[IU]/mL (ref 0.450–4.500)

## 2023-07-28 LAB — INSULIN, RANDOM: INSULIN: 54.5 u[IU]/mL — ABNORMAL HIGH (ref 2.6–24.9)

## 2023-07-28 LAB — VITAMIN D 25 HYDROXY (VIT D DEFICIENCY, FRACTURES): Vit D, 25-Hydroxy: 14.6 ng/mL — ABNORMAL LOW (ref 30.0–100.0)

## 2023-07-28 LAB — VITAMIN B12: Vitamin B-12: 418 pg/mL (ref 232–1245)

## 2023-07-31 ENCOUNTER — Telehealth (INDEPENDENT_AMBULATORY_CARE_PROVIDER_SITE_OTHER): Payer: Self-pay | Admitting: Bariatrics

## 2023-07-31 ENCOUNTER — Encounter: Payer: Self-pay | Admitting: Bariatrics

## 2023-07-31 DIAGNOSIS — E88819 Insulin resistance, unspecified: Secondary | ICD-10-CM | POA: Insufficient documentation

## 2023-07-31 DIAGNOSIS — E781 Pure hyperglyceridemia: Secondary | ICD-10-CM | POA: Insufficient documentation

## 2023-07-31 DIAGNOSIS — E559 Vitamin D deficiency, unspecified: Secondary | ICD-10-CM | POA: Insufficient documentation

## 2023-07-31 NOTE — Telephone Encounter (Signed)
Notified patient per Dr. Manson Passey that her some of her labs are out of range but essentially not concerning. Other than her Vitamin D and Insulin that will be discussed at her next visit. Patient verbalized understanding.

## 2023-07-31 NOTE — Telephone Encounter (Signed)
Pt called in stating she received test results on MyChart and she would like for someone to call her to go over them because there is something she is concerned about. Please follow up with the patient.

## 2023-08-01 ENCOUNTER — Encounter: Payer: Self-pay | Admitting: Internal Medicine

## 2023-08-01 ENCOUNTER — Ambulatory Visit (INDEPENDENT_AMBULATORY_CARE_PROVIDER_SITE_OTHER): Payer: BC Managed Care – PPO | Admitting: Internal Medicine

## 2023-08-01 VITALS — BP 132/82 | HR 66 | Temp 98.3°F | Ht 63.0 in | Wt 324.6 lb

## 2023-08-01 DIAGNOSIS — E88819 Insulin resistance, unspecified: Secondary | ICD-10-CM | POA: Diagnosis not present

## 2023-08-01 DIAGNOSIS — D509 Iron deficiency anemia, unspecified: Secondary | ICD-10-CM | POA: Insufficient documentation

## 2023-08-01 DIAGNOSIS — E559 Vitamin D deficiency, unspecified: Secondary | ICD-10-CM

## 2023-08-01 DIAGNOSIS — G4733 Obstructive sleep apnea (adult) (pediatric): Secondary | ICD-10-CM | POA: Diagnosis not present

## 2023-08-01 MED ORDER — VITAMIN D (ERGOCALCIFEROL) 1.25 MG (50000 UNIT) PO CAPS: 50000.0000 [IU] | ORAL_CAPSULE | ORAL | 1 refills | Status: DC

## 2023-08-01 NOTE — Progress Notes (Signed)
 [facility] PRIMARY CARE [facility] [address] [place] [address] Dept: [phone] Dept Fax: [phone]  Acute Care Office Visit  Subjective:   Deborah  Deborah Mcbride [birth year] 08/01/2023  Chief Complaint  Patient presents with   Nutrition Counseling    Discuss lab results    Fatigue    HPI: Discussed the use of AI scribe software for clinical note transcription with the patient, who gave verbal consent to proceed.  History of Present Illness   The patient, with a history of sleep apnea, anemia, and low vitamin D  levels, presents with concerns about recent lab work from bariatric clinic, and concerns of fatigue, weight gain, and frequent urination. The patient reports feeling constantly tired, even after a full night's sleep, and struggles with the desire to eat despite not feeling hungry. The patient also mentions frequent urination, which has been disruptive to their work schedule.   The patient also expresses concerns about a lack of sexual desire, which they attribute to their overall health and weight issues. The patient is considering weight loss surgery and a hysterectomy, but has concerns about the risks and potential complications of these procedures. The patient's fatigue and other symptoms have been affecting their quality of life, including their ability to work and participate in activities they enjoy.         The following portions of the patient's history were reviewed and updated as appropriate: past medical history, past surgical history, family history, social history, allergies, medications, and problem list.   Patient Active Problem List   Diagnosis Date Noted   Iron  deficiency anemia 08/01/2023   Insulin  resistance 07/31/2023   Hypertriglyceridemia 07/31/2023   Vitamin D  deficiency 07/31/2023   Hepatosplenomegaly 04/10/2023   Nasal congestion 01/19/2023   Sinus tachycardia 12/07/2022   OSA (obstructive sleep apnea)  10/21/2022   BMI 50.0-59.9, adult (HCC) 08/03/2022   Morbid obesity (HCC) 07/07/2022   OSA treated with BiPAP 07/07/2022   Gastroesophageal reflux disease without esophagitis 07/07/2022   Past Medical History:  Diagnosis Date   Anemia    Back pain    GERD (gastroesophageal reflux disease)    Joint pain    Obesity    Sleep apnea    Vitamin D  deficiency    Past Surgical History:  Procedure Laterality Date   CESAREAN SECTION     CHOLECYSTECTOMY     TONSILLECTOMY     TUBAL LIGATION     Family History  Problem Relation Age of Onset   Diabetes Father    High blood pressure Father    Sleep apnea Father    Obesity Father     Current Outpatient Medications:    Vitamin D , Ergocalciferol , (DRISDOL ) 1.25 MG (50000 UNIT) CAPS capsule, Take 1 capsule (50,000 Units total) by mouth every 7 (seven) days., Disp: 12 capsule, Rfl: 1   cholecalciferol  (VITAMIN D3) 25 MCG (1000 UNIT) tablet, Take 1 tablet (1,000 Units total) by mouth daily. (Patient not taking: Reported on 08/01/2023), Disp: 90 tablet, Rfl: 1   famotidine  (PEPCID ) 40 MG tablet, TAKE 1 TABLET(40 MG) BY MOUTH DAILY (Patient not taking: Reported on 08/01/2023), Disp: 90 tablet, Rfl: 1   ibuprofen  (ADVIL ) 800 MG tablet, Take 1 tablet (800 mg total) by mouth every 8 (eight) hours as needed for moderate pain. (Patient not taking: Reported on 08/01/2023), Disp: 30 tablet, Rfl: 1   ondansetron  (ZOFRAN ) 4 MG tablet, Take 1 tablet (4 mg total) by mouth every 8 (eight) hours as needed for nausea or  vomiting. (Patient not taking: Reported on 08/01/2023), Disp: 20 tablet, Rfl: 0   tirzepatide  (ZEPBOUND ) 2.5 MG/0.5ML injection vial, Inject 2.5 mg into the skin once a week. (Patient not taking: Reported on 08/01/2023), Disp: 2 mL, Rfl: 0 Allergies  Allergen Reactions   Fentanyl  Shortness Of Breath, Itching and Nausea And Vomiting    rash   Hydromorphone  Itching   Dilaudid  [Hydromorphone  Hcl] Itching     ROS: A complete ROS was performed  with pertinent positives/negatives noted in the HPI. The remainder of the ROS are negative.    Objective:   Today's Vitals   08/01/23 0821  BP: 132/82  Pulse: 66  Temp: 98.3 F (36.8 C)  TempSrc: Temporal  SpO2: 97%  Weight: (!) 324 lb 9.6 oz (147.2 kg)  Height: 5' 3 (1.6 m)    GENERAL: Well-appearing, in NAD. Well nourished.  SKIN: Pink, warm and dry.  RESPIRATORY: Respirations even and non-labored.  NEUROLOGIC: Steady, even gait.  PSYCH/MENTAL STATUS: Alert, oriented x 3. Cooperative, appropriate mood and affect.    No results found for any visits on 08/01/23.    Assessment & Plan:  Assessment and Plan    Vitamin D  Deficiency  Vitamin D  level significantly low. -Start Vitamin D  supplement once weekly. -Recheck Vitamin D  level in three months.   Iron  Deficiency Changes in CBC suggestive of iron  deficiency, but iron  levels not checked. Hx of IDA.  -Order iron  level and ferritin level today. -Consider starting iron  supplement based on results.  Insulin  Resistance High insulin  level with normal glucose and A1c. Elevated triglycerides. -Continue follow up with weight management  Sleep Apnea Last sleep study was five years ago. -Schedule consultation with sleep medicine for potential adjustment of BiPAP settings. -Consider repeat sleep study.  Obesity Patient considering weight loss surgery. Patient reports difficulty losing weight and increased cravings. -Continue follow-up with bariatric clinic. -Consider medication for weight loss if approved by insurance.  General Health Maintenance / Followup Plans -Follow up in three months to recheck Vitamin D  level. -Contact patient when iron  level results are available. -Schedule consultation with sleep medicine. -Follow up with bariatric clinic as scheduled.      Meds ordered this encounter  Medications   Vitamin D , Ergocalciferol , (DRISDOL ) 1.25 MG (50000 UNIT) CAPS capsule    Sig: Take 1 capsule (50,000 Units  total) by mouth every 7 (seven) days.    Dispense:  12 capsule    Refill:  1    Supervising Provider:   THOMPSON, AARON B [[id number]]   Orders Placed This Encounter  Procedures   Iron , TIBC and Ferritin Panel   Ambulatory referral to Sleep Studies    Referral Priority:   Routine    Referral Type:   Consultation    Referral Reason:   Specialty Services Required    Number of Visits Requested:   1   Lab Orders         Iron , TIBC and Ferritin Panel     No images are attached to the encounter or orders placed in the encounter.  Return in about 3 months (around 10/30/2023) for Vitamin D  deficiency, Anemia, OSA, GERD, weight.   Rosina Senters, FNP

## 2023-08-01 NOTE — Patient Instructions (Signed)
Vyleesi - increase sex drive

## 2023-08-02 LAB — IRON,TIBC AND FERRITIN PANEL
%SAT: 10 % — ABNORMAL LOW (ref 16–45)
Ferritin: 21 ng/mL (ref 16–154)
Iron: 40 ug/dL (ref 40–190)
TIBC: 405 ug/dL (ref 250–450)

## 2023-08-07 ENCOUNTER — Other Ambulatory Visit: Payer: Self-pay

## 2023-08-07 ENCOUNTER — Emergency Department (HOSPITAL_COMMUNITY)
Admission: EM | Admit: 2023-08-07 | Discharge: 2023-08-07 | Disposition: A | Discharge status: Against Medical Advice | Payer: BC Managed Care – PPO | Attending: Emergency Medicine | Admitting: Emergency Medicine

## 2023-08-07 ENCOUNTER — Emergency Department (HOSPITAL_COMMUNITY): Payer: BC Managed Care – PPO

## 2023-08-07 ENCOUNTER — Encounter (HOSPITAL_COMMUNITY): Payer: Self-pay

## 2023-08-07 DIAGNOSIS — R079 Chest pain, unspecified: Secondary | ICD-10-CM | POA: Insufficient documentation

## 2023-08-07 DIAGNOSIS — R0789 Other chest pain: Secondary | ICD-10-CM | POA: Diagnosis not present

## 2023-08-07 DIAGNOSIS — I499 Cardiac arrhythmia, unspecified: Secondary | ICD-10-CM | POA: Diagnosis not present

## 2023-08-07 LAB — LIPASE, BLOOD: Lipase: 19 U/L (ref 11–51)

## 2023-08-07 LAB — CBC WITH DIFFERENTIAL/PLATELET
Abs Immature Granulocytes: 0.12 10*3/uL — ABNORMAL HIGH (ref 0.00–0.07)
Basophils Absolute: 0.1 10*3/uL (ref 0.0–0.1)
Basophils Relative: 1 %
Eosinophils Absolute: 0.2 10*3/uL (ref 0.0–0.5)
Eosinophils Relative: 2 %
HCT: 39.2 % (ref 36.0–46.0)
Hemoglobin: 12.5 g/dL (ref 12.0–15.0)
Immature Granulocytes: 1 %
Lymphocytes Relative: 23 %
Lymphs Abs: 2.2 10*3/uL (ref 0.7–4.0)
MCH: 26.5 pg (ref 26.0–34.0)
MCHC: 31.9 g/dL (ref 30.0–36.0)
MCV: 83.2 fL (ref 80.0–100.0)
Monocytes Absolute: 0.7 10*3/uL (ref 0.1–1.0)
Monocytes Relative: 7 %
Neutro Abs: 6.6 10*3/uL (ref 1.7–7.7)
Neutrophils Relative %: 66 %
Platelets: 356 10*3/uL (ref 150–400)
RBC: 4.71 MIL/uL (ref 3.87–5.11)
RDW: 15.4 % (ref 11.5–15.5)
WBC: 9.8 10*3/uL (ref 4.0–10.5)
nRBC: 0 % (ref 0.0–0.2)

## 2023-08-07 LAB — TROPONIN I (HIGH SENSITIVITY): Troponin I (High Sensitivity): 3 ng/L (ref <18)

## 2023-08-07 LAB — COMPREHENSIVE METABOLIC PANEL
ALT: 17 U/L (ref 0–44)
AST: 21 U/L (ref 15–41)
Albumin: 3.6 g/dL (ref 3.5–5.0)
Alkaline Phosphatase: 69 U/L (ref 38–126)
Anion gap: 11 (ref 5–15)
BUN: 11 mg/dL (ref 6–20)
CO2: 23 mmol/L (ref 22–32)
Calcium: 9.5 mg/dL (ref 8.9–10.3)
Chloride: 103 mmol/L (ref 98–111)
Creatinine, Ser: 0.7 mg/dL (ref 0.44–1.00)
GFR, Estimated: >60 mL/min (ref >60)
Glucose, Bld: 86 mg/dL (ref 70–99)
Potassium: 3.8 mmol/L (ref 3.5–5.1)
Sodium: 137 mmol/L (ref 135–145)
Total Bilirubin: 0.8 mg/dL (ref 0.0–1.2)
Total Protein: 6.8 g/dL (ref 6.5–8.1)

## 2023-08-07 MED ORDER — ASPIRIN 81 MG PO CHEW: 324.0000 mg | CHEWABLE_TABLET | Freq: Once | ORAL | Status: DC

## 2023-08-07 NOTE — ED Provider Triage Note (Signed)
 Emergency Medicine Provider Triage Evaluation Note  Deborah Mcbride  D Fiske , a 38 y.o. female  was evaluated in triage.  Pt complains of chest pain on the left, this came on this morning while she was at work, she works from home for [employer], she denies anything that was anxiety provoking, no history of chest pain, she does not exercise, she has no trouble taking stairs in her house, she does feel little short of breath, pain radiates to the left side of the back, feels like she has pain in her left arm with tingling of all of her fingers.  No issues with her legs, no recent travel or trauma.  Review of Systems  Positive: Chest pain back pain nausea Negative: Fevers vomiting diarrhea  Physical Exam  BP 123/73   Pulse (!) 51   Temp 98.2 F (36.8 C) (Oral)   Resp 18   LMP 07/18/2023 (Approximate)   SpO2 98%  Gen:   Awake, no distress holding the left side of her chest Resp:  Normal effort clear lung sounds Cardiovascular: Normal heart rate, slightly bradycardic in the upper 50s, bilateral mild edema at the ankles, chronic MSK:   Moves extremities without difficulty no tenderness in the legs Other:  Well-appearing, speaks in full sentences, no neurologic findings, normal strength and sensation bilaterally, cranial nerves III through XII are normal  Medical Decision Making  Medically screening exam initiated at 12:04 PM.  Appropriate orders placed.  Antoniette  D Kahre was informed that the remainder of the evaluation will be completed by another provider, this initial triage assessment does not replace that evaluation, and the importance of remaining in the ED until their evaluation is complete.  EKG is unremarkable other than being mildly bradycardic, patient will get a workup including troponin lipase and a chest x-ray.  She is low risk for cardiac disease but is very obese and will need labs, she does not exercise often, no history of cardiac workup, without hypoxia or tachycardia I would  think that pulmonary embolism is extremely unlikely   Cleotilde Rogue, MD 08/07/23 1205

## 2023-08-07 NOTE — ED Triage Notes (Signed)
 Patient BIB [facility] from home for acute onset of crushing chest pain. Initially EMS reports her EKG was normal, however throughout transport inferior elevation became noted on the patient's EKG. EMS administered 324 mg aspirin  and 0.4 nitroglycerin with no improvement but patient reports SHOB and nausea with symptoms, no cardiac hx noted.  BP 142/90, HR 65, 99% RA, RR 18.

## 2023-08-07 NOTE — ED Provider Notes (Signed)
 [place] EMERGENCY DEPARTMENT AT [facility] Provider Note   CSN: [id number] Arrival date & time: 08/07/23  1153     History  Chief Complaint  Patient presents with   Chest Pain    Deborah Mcbride is a 38 y.o. female.  Patient complains of sudden onset of chest pain today.  Was brought to the emergency department by EMS who reports patient had crushing chest pain.  Patient was given aspirin  and nitroglycerin.  Patient reports she is not currently having chest pain.  Patient denies any fever or chills she has not had any cough or flu symptoms.  Patient denies any history of heart disease she denies diabetes or hypertension.  The history is provided by the patient. No language interpreter was used.  Chest Pain Pain quality: crushing   Progression:  Resolved Chronicity:  New Relieved by:  Aspirin  and nitroglycerin Risk factors: no coronary artery disease, no hypertension, not female and no smoking        Home Medications Prior to Admission medications   Medication Sig Start Date End Date Taking? Authorizing Provider  acetaminophen  (TYLENOL ) 500 MG tablet Take 1,000 mg by mouth every 6 (six) hours as needed for mild pain (pain score 1-3) or moderate pain (pain score 4-6).   Yes [provider]  ibuprofen  (ADVIL ) 200 MG tablet Take 800 mg by mouth every 6 (six) hours as needed for mild pain (pain score 1-3) or moderate pain (pain score 4-6).   Yes [provider]  omeprazole  (PRILOSEC) 40 MG capsule Take 40 mg by mouth daily as needed.   Yes [provider]  Vitamin D , Ergocalciferol , (DRISDOL ) 1.25 MG (50000 UNIT) CAPS capsule Take 1 capsule (50,000 Units total) by mouth every 7 (seven) days. Patient not taking: Reported on 08/07/2023 08/01/23   Billy Knee, FNP      Allergies    Fentanyl , Hydromorphone , and Dilaudid  [hydromorphone  hcl]    Review of Systems   Review of Systems  Cardiovascular:  Positive for chest pain.  Skin:  Negative  for color change.  All other systems reviewed and are negative.   Physical Exam Updated Vital Signs BP (!) 115/57 (BP Location: Left Wrist)   Pulse 67   Temp 98.2 F (36.8 C) (Oral)   Resp 18   Ht 5' 3 (1.6 m)   Wt (!) 147.2 kg   LMP 07/18/2023 (Approximate)   SpO2 99%   BMI 57.49 kg/m  Physical Exam Vitals and nursing note reviewed.  Constitutional:      Appearance: She is well-developed.  HENT:     Head: Normocephalic.  Cardiovascular:     Rate and Rhythm: Normal rate and regular rhythm.     Heart sounds: Normal heart sounds.  Pulmonary:     Effort: Pulmonary effort is normal.     Breath sounds: Normal breath sounds.  Abdominal:     General: There is no distension.     Palpations: Abdomen is soft.  Musculoskeletal:        General: Normal range of motion.     Cervical back: Normal range of motion.  Skin:    General: Skin is warm.  Neurological:     General: No focal deficit present.     Mental Status: She is alert and oriented to person, place, and time.  Psychiatric:        Mood and Affect: Mood normal.     ED Results / Procedures / Treatments   Labs (all labs ordered  are listed, but only abnormal results are displayed) Labs Reviewed  CBC WITH DIFFERENTIAL/PLATELET - Abnormal; Notable for the following components:      Result Value   Abs Immature Granulocytes 0.12 (*)    All other components within normal limits  COMPREHENSIVE METABOLIC PANEL  LIPASE, BLOOD  TROPONIN I (HIGH SENSITIVITY)    EKG EKG Interpretation Date/Time:  Monday August 07 2023 12:00:08 EST Ventricular Rate:  60 PR Interval:  146 QRS Duration:  84 QT Interval:  408 QTC Calculation: 408 R Axis:   81  Text Interpretation: Normal sinus rhythm with sinus arrhythmia Normal ECG When compared with ECG of 11-Mar-2022 17:59, PREVIOUS ECG IS PRESENT Since last tracing rate slower Confirmed by Cleotilde Rogue ([id number]) on 08/07/2023 12:06:14 PM  Radiology DG Chest 2 View Result Date:  08/07/2023 CLINICAL DATA:  Left-sided chest pain radiating to left arm. Shortness of breath. EXAM: CHEST - 2 VIEW COMPARISON:  12/28/2017 FINDINGS: The heart size and mediastinal contours are within normal limits. Both lungs are clear. The visualized skeletal structures are unremarkable. IMPRESSION: Normal exam. Electronically Signed   By: Norleen DELENA Kil M.D.   On: 08/07/2023 12:58    Procedures Procedures    Medications Ordered in ED Medications  aspirin  chewable tablet 324 mg (324 mg Oral Not Given 08/07/23 1211)    ED Course/ Medical Decision Making/ A&P                                 Medical Decision Making She reports she had an episode of chest pain today.  Pain was relieved with aspirin  and nitroglycerin  Amount and/or Complexity of Data Reviewed Independent Historian: spouse    Details: Patient is here with her spouse External Data Reviewed: notes.    Details: Primary care notes reviewed Labs: ordered. Decision-making details documented in ED Course.    Details: Abs ordered reviewed and interpreted.  Lipase is normal troponin is normal Radiology: ordered and independent interpretation performed. Decision-making details documented in ED Course.    Details: X-ray shows no acute cardiopulmonary disease ECG/medicine tests: ordered and independent interpretation performed. Decision-making details documented in ED Course.    Details: EKG normal sinus no acute abnormality  Risk Risk Details: Patient was counseled on initial negative troponin.  Patient advised we will repeat a second troponin and a D-dimer. RN reports that patient left the department before second blood draw.  I did not have the opportunity to reevaluate the patient           Final Clinical Impression(s) / ED Diagnoses Final diagnoses:  Nonspecific chest pain    Rx / DC Orders ED Discharge Orders     None         Nataliya Graig K, PA-C 08/07/23 1745    Yolande Lamar BROCKS, MD 08/11/23 2048

## 2023-08-07 NOTE — ED Notes (Signed)
 Pt left the hallway stretcher ambulatory, no reasoning was given for the action of her leaving.

## 2023-08-07 NOTE — ED Notes (Signed)
 RN and tech attempted blood draw without success, phlebotomy notified.

## 2023-08-09 ENCOUNTER — Encounter: Payer: Self-pay | Admitting: Internal Medicine

## 2023-08-09 ENCOUNTER — Other Ambulatory Visit: Payer: Self-pay | Admitting: Internal Medicine

## 2023-08-09 ENCOUNTER — Telehealth: Payer: Self-pay

## 2023-08-09 ENCOUNTER — Ambulatory Visit (INDEPENDENT_AMBULATORY_CARE_PROVIDER_SITE_OTHER): Payer: BC Managed Care – PPO | Admitting: Internal Medicine

## 2023-08-09 VITALS — BP 128/80 | HR 73 | Temp 98.5°F | Ht 63.0 in | Wt 328.8 lb

## 2023-08-09 DIAGNOSIS — R079 Chest pain, unspecified: Secondary | ICD-10-CM | POA: Diagnosis not present

## 2023-08-09 LAB — TROPONIN I (HIGH SENSITIVITY): High Sens Troponin I: 3 ng/L (ref 2–17)

## 2023-08-09 LAB — D-DIMER, QUANTITATIVE: D-Dimer, Quant: 0.24 ug{FEU}/mL (ref <0.50)

## 2023-08-09 NOTE — Progress Notes (Signed)
 [facility]ARE [facility] [address] [place] [address] Dept: [phone] Dept Fax: [phone]  Acute Care Office Visit  Subjective:   Deborah Mcbride 1985/10/31 08/09/2023  Chief Complaint  Patient presents with   Hospitalization Follow-up    HPI: Discussed the use of AI scribe software for clinical note transcription with the patient, who gave verbal consent to proceed.  The patient, with a history of recent ER visit for chest pain on 08/07/23, presents for follow up. She continues to experience intermittent chest discomfort. The pain, initially severe and radiating to the back and left arm with associated numbness in the fingers, has since lessened but remains noticeable. The pain is located in the middle of the chest and continues to radiate to her back. The patient reports that the pain is sometimes sharp, but a constant ache remains. The patient also experienced shortness of breath and nausea at the onset of the chest pain, but these symptoms have since resolved. The patient was given nitroglycerin by paramedics, which temporarily eased the pain. However, the pain returned quickly after nitro and was then worse. The patient left the ER due to dissatisfaction with the care received and has not had any further evaluation or treatment since. Was supposed to get d-dimer and 2nd Troponin, but left prior to receiving further lab work. The patient also reports recent issues with acid reflux, but states that this chest pain feels different. She did try at OTC acid reducer medication, and states she was unsure if it made any difference in her pain.     The following portions of the patient's history were reviewed and updated as appropriate: past medical history, past surgical history, family history, social history, allergies, medications, and problem list.   Patient Active Problem List   Diagnosis Date Noted   Iron  deficiency anemia 08/01/2023    Insulin  resistance 07/31/2023   Hypertriglyceridemia 07/31/2023   Vitamin D  deficiency 07/31/2023   Hepatosplenomegaly 04/10/2023   Nasal congestion 01/19/2023   Sinus tachycardia 12/07/2022   OSA (obstructive sleep apnea) 10/21/2022   BMI 50.0-59.9, adult (HCC) 08/03/2022   Morbid obesity (HCC) 07/07/2022   OSA treated with BiPAP 07/07/2022   Gastroesophageal reflux disease without esophagitis 07/07/2022   Past Medical History:  Diagnosis Date   Anemia    Back pain    GERD (gastroesophageal reflux disease)    Joint pain    Obesity    Sleep apnea    Vitamin D  deficiency    Past Surgical History:  Procedure Laterality Date   CESAREAN SECTION     CHOLECYSTECTOMY     TONSILLECTOMY     TUBAL LIGATION     Family History  Problem Relation Age of Onset   Diabetes Father    High blood pressure Father    Sleep apnea Father    Obesity Father     Current Outpatient Medications:    acetaminophen  (TYLENOL ) 500 MG tablet, Take 1,000 mg by mouth every 6 (six) hours as needed for mild pain (pain score 1-3) or moderate pain (pain score 4-6)., Disp: , Rfl:    famotidine  (PEPCID ) 20 MG tablet, Take 20 mg by mouth as needed for heartburn or indigestion., Disp: , Rfl:    ibuprofen  (ADVIL ) 200 MG tablet, Take 800 mg by mouth every 6 (six) hours as needed for mild pain (pain score 1-3) or moderate pain (pain score 4-6)., Disp: , Rfl:    omeprazole  (PRILOSEC) 40 MG capsule, Take 40 mg by  mouth daily as needed. (Patient not taking: Reported on 08/09/2023), Disp: , Rfl:    Vitamin D , Ergocalciferol , (DRISDOL ) 1.25 MG (50000 UNIT) CAPS capsule, Take 1 capsule (50,000 Units total) by mouth every 7 (seven) days. (Patient not taking: Reported on 08/09/2023), Disp: 12 capsule, Rfl: 1 Allergies  Allergen Reactions   Fentanyl  Shortness Of Breath, Itching and Nausea And Vomiting    rash   Hydromorphone  Itching   Dilaudid  [Hydromorphone  Hcl] Itching     ROS: A complete ROS was performed with  pertinent positives/negatives noted in the HPI. The remainder of the ROS are negative.    Objective:   Today's Vitals   08/09/23 0945  BP: 128/80  Pulse: 73  Temp: 98.5 F (36.9 C)  TempSrc: Temporal  SpO2: 98%  Weight: (!) 328 lb 12.8 oz (149.1 kg)  Height: 5' 3 (1.6 m)    GENERAL: Well-appearing, in NAD. Well nourished.  SKIN: Pink, warm and dry. No rash. NECK: Trachea midline. Full ROM w/o pain or tenderness. No lymphadenopathy.  RESPIRATORY: Chest wall symmetrical. Respirations even and non-labored. Breath sounds clear to auscultation bilaterally.  CARDIAC: S1, S2 present, regular rate and rhythm. Peripheral pulses 2+ bilaterally.  EXTREMITIES: Without clubbing, cyanosis, or edema.  NEUROLOGIC: No motor or sensory deficits. Steady, even gait.  PSYCH/MENTAL STATUS: Alert, oriented x 3. Cooperative, appropriate mood and affect.   EKG RESULT: EKG tracing is personally reviewed.   EKG: normal EKG, normal sinus rhythm, unchanged from previous tracings.   No results found for any visits on 08/09/23.    Assessment & Plan:  Assessment and Plan     Chest Pain Persistent chest pain since ER visit a few days ago. Pain is central, radiating to the back and left arm, associated with numbness in fingers and initial shortness of breath. EKG changes were noted by paramedics, but patient left ER before repeat troponin could be checked. EKG today shows normal sinus rhythm. -Obtain stat D-dimer and troponin level. -Advise patient to return to ER if chest pain worsens or does not alleviate.     No orders of the defined types were placed in this encounter.  Orders Placed This Encounter  Procedures   D-dimer, quantitative   EKG 12-Lead   Lab Orders         D-dimer, quantitative     No images are attached to the encounter or orders placed in the encounter.  Return for Scheduled Routine Office Visits and as needed.   Rosina Senters, FNP

## 2023-08-09 NOTE — Telephone Encounter (Signed)
 Error

## 2023-08-10 ENCOUNTER — Other Ambulatory Visit (HOSPITAL_COMMUNITY): Payer: Self-pay

## 2023-08-10 ENCOUNTER — Encounter: Payer: Self-pay | Admitting: Bariatrics

## 2023-08-10 ENCOUNTER — Other Ambulatory Visit: Payer: Self-pay | Admitting: Internal Medicine

## 2023-08-10 ENCOUNTER — Ambulatory Visit (INDEPENDENT_AMBULATORY_CARE_PROVIDER_SITE_OTHER): Payer: BC Managed Care – PPO | Admitting: Bariatrics

## 2023-08-10 VITALS — BP 124/61 | HR 80 | Temp 98.0°F | Ht 63.0 in | Wt 324.0 lb

## 2023-08-10 DIAGNOSIS — E559 Vitamin D deficiency, unspecified: Secondary | ICD-10-CM

## 2023-08-10 DIAGNOSIS — E66813 Obesity, class 3: Secondary | ICD-10-CM

## 2023-08-10 DIAGNOSIS — E781 Pure hyperglyceridemia: Secondary | ICD-10-CM

## 2023-08-10 DIAGNOSIS — E88819 Insulin resistance, unspecified: Secondary | ICD-10-CM

## 2023-08-10 DIAGNOSIS — R632 Polyphagia: Secondary | ICD-10-CM

## 2023-08-10 DIAGNOSIS — Z6841 Body Mass Index (BMI) 40.0 and over, adult: Secondary | ICD-10-CM | POA: Diagnosis not present

## 2023-08-10 MED ORDER — TIRZEPATIDE-WEIGHT MANAGEMENT 2.5 MG/0.5ML ~~LOC~~ SOAJ
2.5000 mg | SUBCUTANEOUS | 0 refills | Status: DC
  Filled 2023-08-10 – 2023-09-05 (×11): qty 2, 28d supply, fill #0

## 2023-08-10 MED ORDER — ONDANSETRON HCL 4 MG PO TABS
4.0000 mg | ORAL_TABLET | Freq: Three times a day (TID) | ORAL | 0 refills | Status: DC | PRN
  Filled 2023-08-10 – 2023-08-11 (×2): qty 20, 7d supply, fill #0

## 2023-08-10 MED ORDER — VITAMIN D (ERGOCALCIFEROL) 1.25 MG (50000 UNIT) PO CAPS
50000.0000 [IU] | ORAL_CAPSULE | ORAL | 0 refills | Status: DC
  Filled 2023-08-10 – 2023-08-31 (×2): qty 5, 35d supply, fill #0

## 2023-08-10 NOTE — Progress Notes (Signed)
 First follow-up after initial visit.        WEIGHT SUMMARY AND BIOMETRICS  Weight Lost Since Last Visit: 0  Weight Gained Since Last Visit: 4lb   Vitals Temp: 98.5 F (36.9 C) BP: 128/80 Pulse Rate: 73 SpO2: 98 %   Anthropometric Measurements Height: 5' 3 (1.6 m) Weight: (!) 324 lb (147 kg) BMI (Calculated): 57.41 Weight at Last Visit: 320lb Weight Lost Since Last Visit: 0 Weight Gained Since Last Visit: 4lb Starting Weight: 320lb Total Weight Loss (lbs): 0 lb (0 kg)   Body Composition  Body Fat %: 54.9 % Fat Mass (lbs): 178.4 lbs Muscle Mass (lbs): 139 lbs Total Body Water (lbs): 110 lbs Visceral Fat Rating : 21   Other Clinical Data Fasting: no Labs: no Today's Visit #: 2 Starting Date: 07/27/23    OBESITY Deborah Mcbride  is here to discuss her progress with her obesity treatment plan along with follow-up of her obesity related diagnoses.    Nutrition Plan: the Category 3 plan - 50% adherence.  Current exercise: walking  Interim History:  She is up 4 lbs since her last visit.  Not eating all of the food on the plan., Protein intake is less than prescribed., Is skipping meals, Water intake is adequate., and Reports polyphagia  Initial positives regarding the dietary plan:  Initial challenges regarding  the dietary plan:   Pharmacotherapy: Deborah Mcbride  is on Zepbound  2.5 mg SQ weekly Adverse side effects: None Hunger is poorly controlled.  Cravings are moderately controlled.  Assessment/Plan:   Vitamin D  Deficiency Vitamin D  is not at goal of 50.  Most recent vitamin D  level was 14.6. She is on  prescription ergocalciferol  50,000 IU weekly. Lab Results  Component Value Date   VD25OH 14.6 (L) 07/27/2023    Plan: Refill prescription vitamin D  50,000 IU weekly.   Insulin  Resistance Deborah Mcbride  has had elevated fasting insulin  readings.  Goal is HgbA1c < 5.7, fasting insulin  at l0 or less, and preferably at 5.  She reports polyphagia. Medication(s): none Lab Results  Component Value Date   HGBA1C 5.5 07/27/2023   Lab Results  Component Value Date   INSULIN  54.5 (H) 07/27/2023    Plan Medication(s): Zepbound  2.5 mg SQ weekly Will work on the agreed upon plan. Will minimize refined carbohydrates ( sweets and starches), and focus more on complex carbohydrates.  Increase the micronutrients found in leafy greens, which include magnesium, polyphenols, and vitamin C which have been postulated to help with insulin  sensitivity. Minimize fast food and cook more meals at home.  Increase fiber to 25 to 30 grams daily.  Information sheet on  Insulin  Resistance and Prediabetes.   Will have a protein shake for breakfast.  Recipes given.  Will increase her water.     Morbid Obesity: Current BMI BMI (Calculated): 57.41   Pharmacotherapy Plan Start  Zepbound  2.5 mg SQ weekly Rx for Zofran  4 mg every 8 hours PRN.   Deborah Mcbride  is currently in the action stage of change. As such, her goal is to continue with weight loss efforts.  She has agreed to the Category 3 plan.  Exercise goals: All adults should avoid inactivity. Some physical activity is better than none, and adults who participate in any amount of physical activity gain some health benefits.  Behavioral modification strategies: increasing lean protein intake, decreasing simple carbohydrates , no meal skipping, decrease eating out, meal planning , increase water intake, better snacking choices, planning for success, increasing vegetables, increasing fiber rich foods, get rid of junk  food in the home, decrease snacking , keep healthy foods in the home, weigh protein portions, and mindful eating.  Deborah Mcbride  has agreed to follow-up with our clinic in 2 weeks.   Labs reviewed today from last visit (CMP, Lipids, HgbA1c, insulin , vitamin D , B 12, and thyroid  panel).    Objective:   VITALS: Per patient if applicable, see vitals. GENERAL: Alert and in no acute distress. CARDIOPULMONARY: No increased WOB. Speaking in clear sentences.  PSYCH: Pleasant and cooperative. Speech normal rate and rhythm. Affect is appropriate. Insight and judgement are appropriate. Attention is focused, linear, and appropriate.  NEURO: Oriented as arrived to appointment on time with no prompting.   Attestation Statements:    This was prepared with the assistance of Engineer, Civil (consulting).  Occasional wrong-word or sound-a-like substitutions may have occurred due to the inherent limitations of voice recognition software.   Clayborne Daring, DO

## 2023-08-11 ENCOUNTER — Other Ambulatory Visit (HOSPITAL_COMMUNITY): Payer: Self-pay

## 2023-08-11 ENCOUNTER — Other Ambulatory Visit: Payer: Self-pay

## 2023-08-11 DIAGNOSIS — G4733 Obstructive sleep apnea (adult) (pediatric): Secondary | ICD-10-CM | POA: Diagnosis not present

## 2023-08-11 MED ORDER — FAMOTIDINE 20 MG PO TABS
20.0000 mg | ORAL_TABLET | ORAL | 1 refills | Status: AC | PRN
  Filled 2023-08-11: qty 90, 90d supply, fill #0
  Filled 2023-09-01 – 2024-03-04 (×5): qty 90, 90d supply, fill #1

## 2023-08-11 MED ORDER — ACETAMINOPHEN 500 MG PO TABS
1000.0000 mg | ORAL_TABLET | Freq: Four times a day (QID) | ORAL | 0 refills | Status: DC | PRN
  Filled 2023-08-11 – 2023-09-01 (×2): qty 60, 8d supply, fill #0
  Filled 2023-09-05: qty 100, 13d supply, fill #0

## 2023-08-11 MED ORDER — IBUPROFEN 800 MG PO TABS
800.0000 mg | ORAL_TABLET | Freq: Three times a day (TID) | ORAL | 0 refills | Status: DC | PRN
  Filled 2023-08-11: qty 30, 10d supply, fill #0

## 2023-08-11 NOTE — Telephone Encounter (Signed)
 Requesting: acetaminophen (TYLENOL) 500 MG tablet, ibuprofen (ADVIL) 200 MG tablet , famotidine (PEPCID) 20 MG tablet  Last Visit: 08/09/2023 Next Visit: Visit date not found Last Refill: by Historical Provider  Please Advise

## 2023-08-14 ENCOUNTER — Telehealth: Payer: Self-pay | Admitting: *Deleted

## 2023-08-14 ENCOUNTER — Other Ambulatory Visit (HOSPITAL_COMMUNITY): Payer: Self-pay

## 2023-08-14 NOTE — Telephone Encounter (Signed)
 Prior authorization done via cover my meds for patients Zepbound. Waiting on determination.

## 2023-08-16 ENCOUNTER — Other Ambulatory Visit: Payer: Self-pay | Admitting: Internal Medicine

## 2023-08-16 ENCOUNTER — Other Ambulatory Visit (HOSPITAL_COMMUNITY): Payer: Self-pay

## 2023-08-16 MED ORDER — OMEPRAZOLE 40 MG PO CPDR
40.0000 mg | DELAYED_RELEASE_CAPSULE | Freq: Every day | ORAL | 0 refills | Status: DC | PRN
  Filled 2023-08-16: qty 90, 90d supply, fill #0

## 2023-08-16 NOTE — Telephone Encounter (Signed)
 Last Ov 08/09/23 Filled by historical provider

## 2023-08-16 NOTE — Telephone Encounter (Signed)
 Started PA for Zepbound  for [facility] through covermeds.

## 2023-08-17 ENCOUNTER — Other Ambulatory Visit (HOSPITAL_COMMUNITY): Payer: Self-pay

## 2023-08-21 ENCOUNTER — Other Ambulatory Visit (HOSPITAL_COMMUNITY): Payer: Self-pay

## 2023-08-21 ENCOUNTER — Telehealth: Payer: Self-pay | Admitting: Bariatrics

## 2023-08-21 IMAGING — CT CT HEAD CODE STROKE
3 series · 15 of 47 positions shown, 18 images · non-contrast
Comparison: Brain MRI 11/10/2009, CT head 12/07/2010

CLINICAL DATA: Code stroke.

EXAM:
CT HEAD WITHOUT CONTRAST
TECHNIQUE: Contiguous axial images were obtained from the base of the skull
through the vertex without intravenous contrast.

[Series 4: head 5.0 h30s · axial · 0.42mm/px · z∈[-225,-90]mm · 9 of 33 slices shown, 12 images]
[im 3/33  brain]
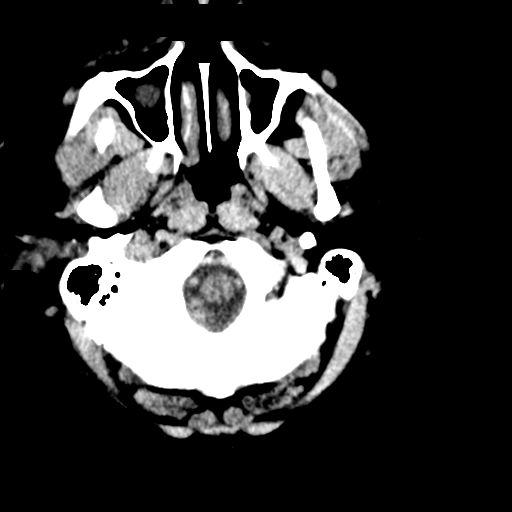
[im 3/33  bone]
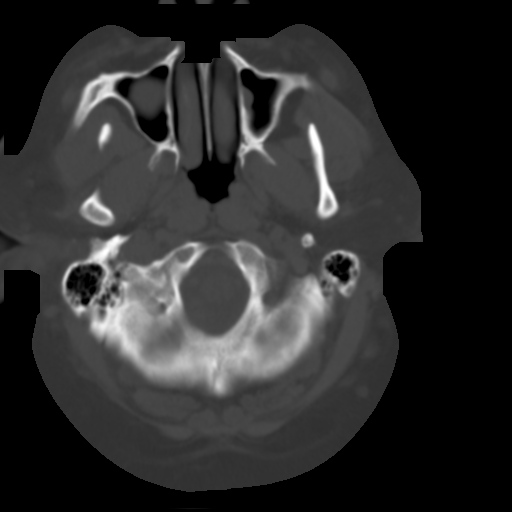
[im 6/33  brain]
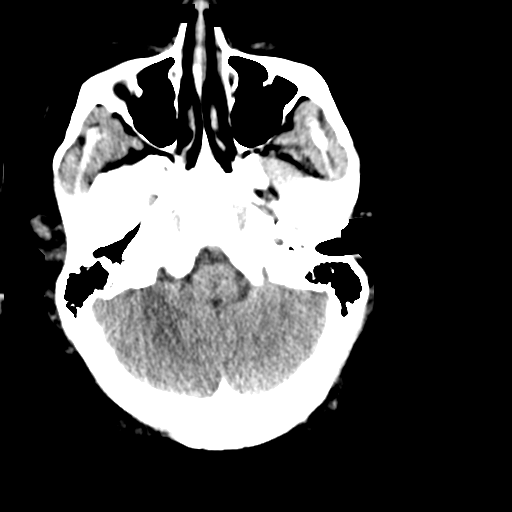
[im 9/33  brain]
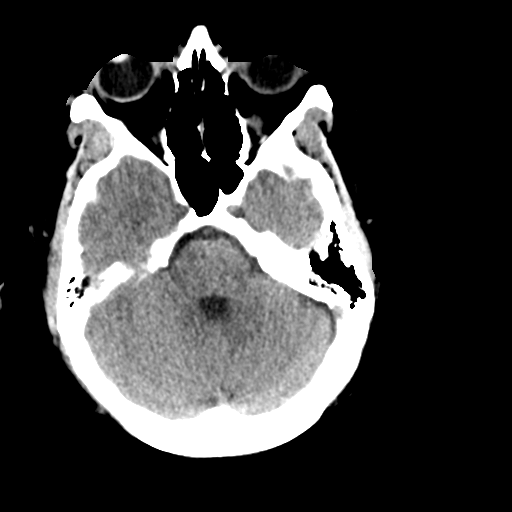
[im 13/33  brain]
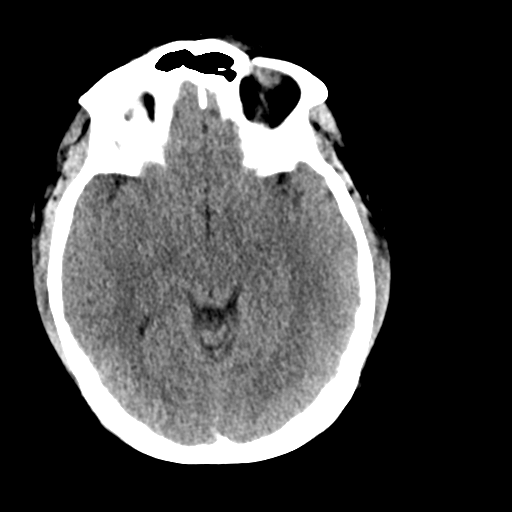
[im 17/33  brain]
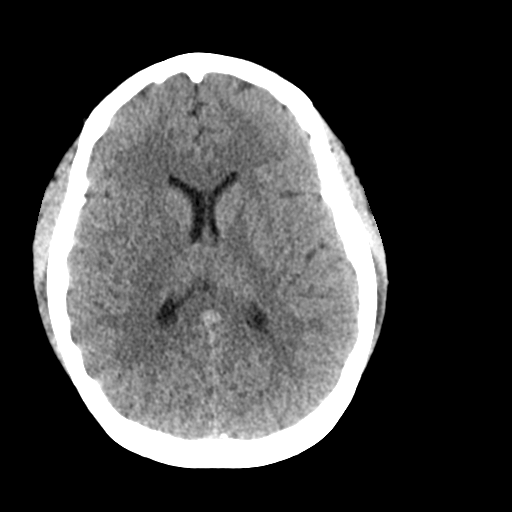
[im 17/33  bone]
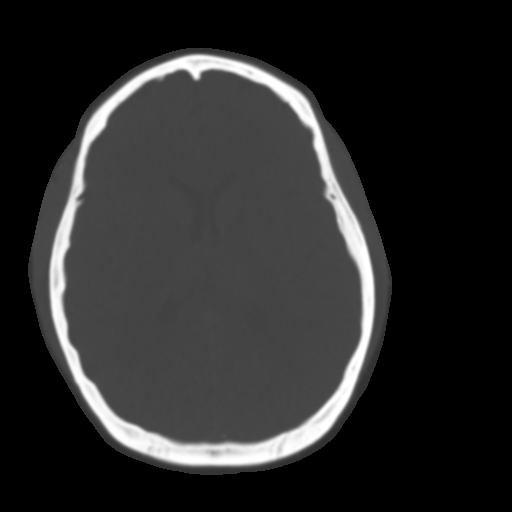
[im 20/33  brain]
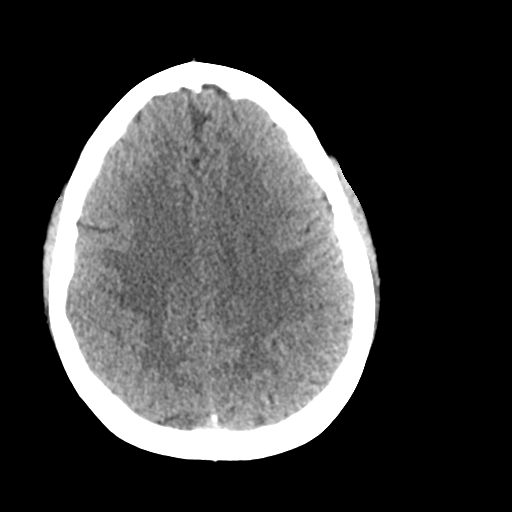
[im 24/33  brain]
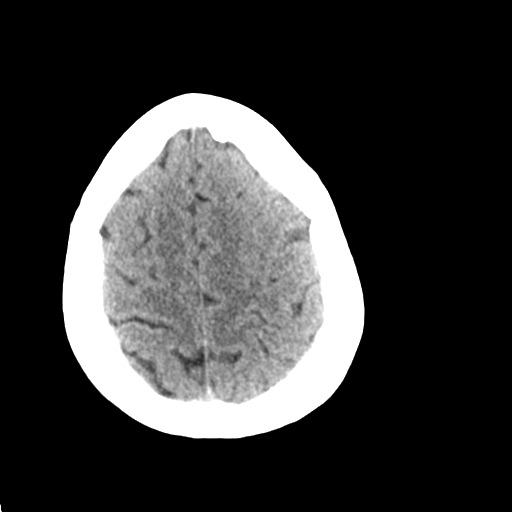
[im 27/33  brain]
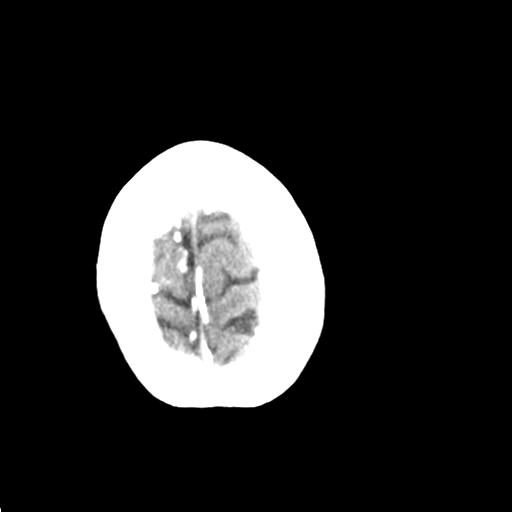
[im 30/33  brain]
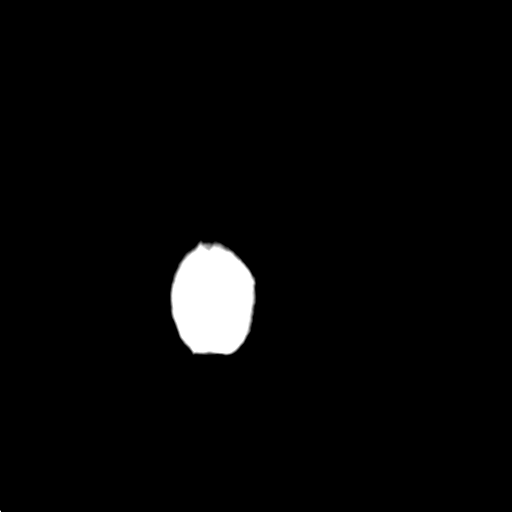
[im 30/33  bone]
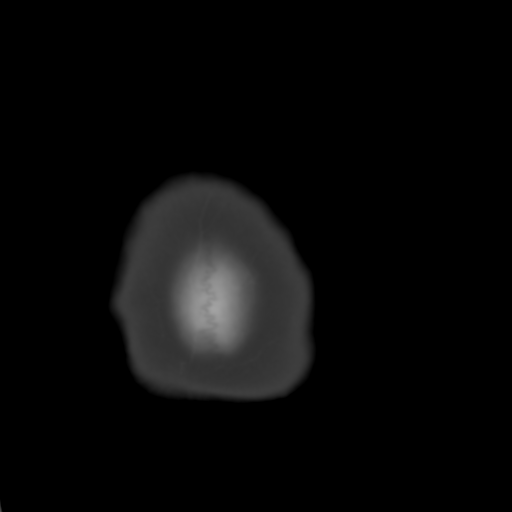

[Series 5: head 3.0 mpr cor · coronal · 0.33mm/px · 3 of 69 slices shown]
[im 23/69  brain]
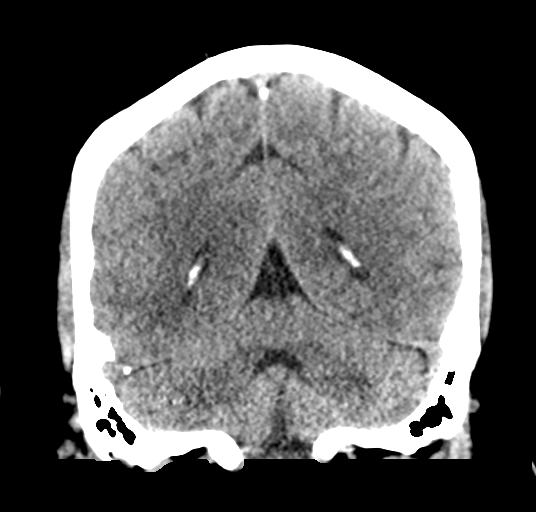
[im 31/69  brain]
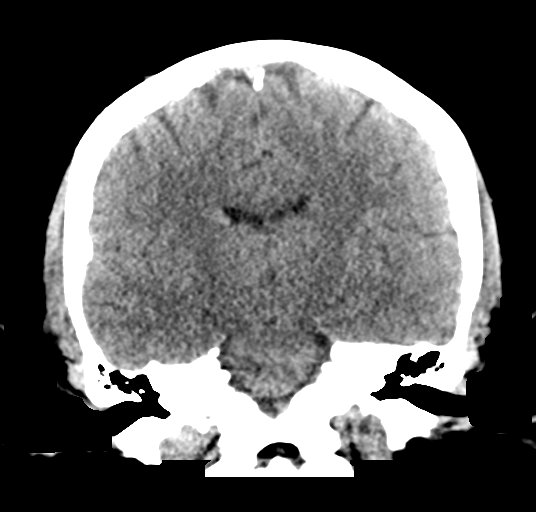
[im 38/69  brain]
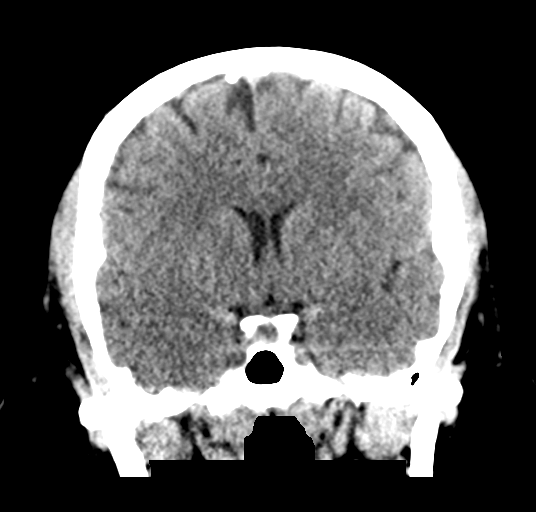

[Series 6: head 3.0 mpr sag · sagittal · 0.32mm/px · 3 of 58 slices shown]
[im 20/58  brain]
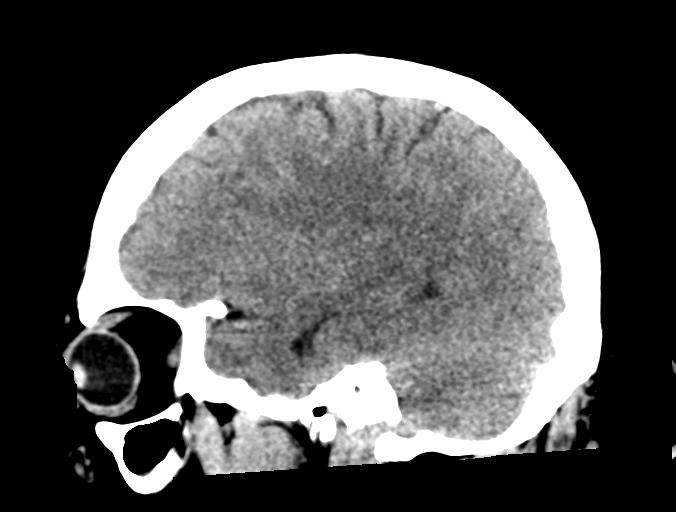
[im 29/58  brain]
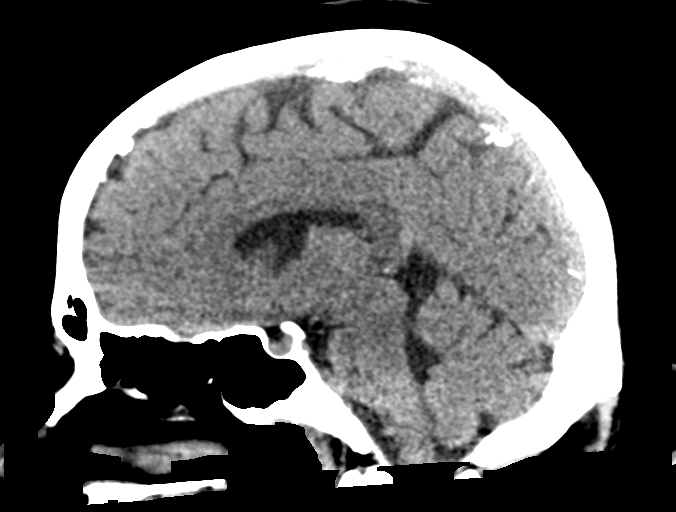
[im 39/58  brain]
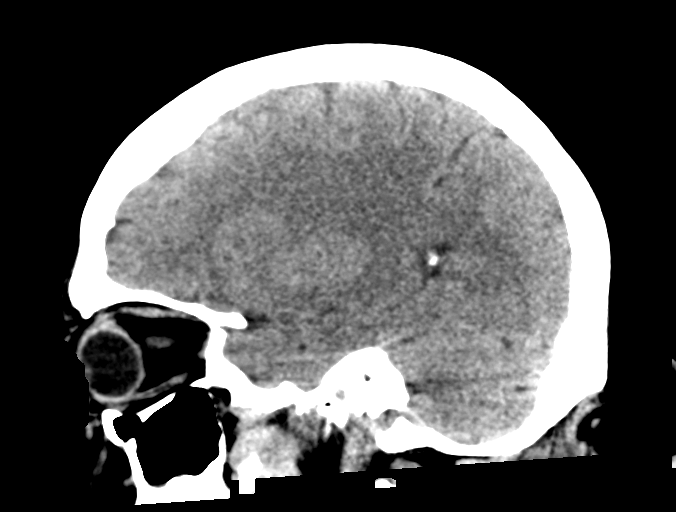

[15 of 47 positions shown; findings below may reference images not displayed]

FINDINGS: Brain: There is no evidence of acute intracranial hemorrhage,
extra-axial fluid collection, or acute infarct.

The ventricles are not enlarged. There is no mass lesion. There is
no midline shift.

Vascular: No hyperdense vessel or unexpected calcification.

Skull: Normal. Negative for fracture or focal lesion.

Sinuses/Orbits: There is a mucous retention cyst in the right
maxillary sinus and mild mucosal thickening in the left maxillary
sinus. The globes and orbits are unremarkable.

Other: None.

ASPECTS (Alberta Stroke Program Early CT Score)

- Ganglionic level infarction (caudate, lentiform nuclei, internal
capsule, insula, M1-M3 cortex): 7

- Supraganglionic infarction (M4-M6 cortex): 3

Total score (0-10 with 10 being normal): 10
IMPRESSION: 1. No acute intracranial hemorrhage or infarct.
2. ASPECTS is 10

These results were paged via AMION at the time of interpretation on
04/18/2021 at [DATE] to provider Dr Taron.

## 2023-08-21 NOTE — Telephone Encounter (Signed)
Responded to patient via MyChart message.

## 2023-08-21 NOTE — Telephone Encounter (Signed)
Pt is calling to check the status on her PA. Please call pt back or send a mychart message.Deborah Mcbride

## 2023-08-22 ENCOUNTER — Encounter: Payer: Self-pay | Admitting: Internal Medicine

## 2023-08-22 ENCOUNTER — Ambulatory Visit (INDEPENDENT_AMBULATORY_CARE_PROVIDER_SITE_OTHER): Payer: BC Managed Care – PPO | Admitting: Internal Medicine

## 2023-08-22 ENCOUNTER — Other Ambulatory Visit (HOSPITAL_COMMUNITY): Payer: Self-pay

## 2023-08-22 VITALS — BP 120/70 | HR 80 | Temp 98.3°F | Ht 63.0 in | Wt 330.8 lb

## 2023-08-22 DIAGNOSIS — G4733 Obstructive sleep apnea (adult) (pediatric): Secondary | ICD-10-CM | POA: Diagnosis not present

## 2023-08-22 DIAGNOSIS — E559 Vitamin D deficiency, unspecified: Secondary | ICD-10-CM

## 2023-08-22 DIAGNOSIS — R5383 Other fatigue: Secondary | ICD-10-CM

## 2023-08-22 DIAGNOSIS — D509 Iron deficiency anemia, unspecified: Secondary | ICD-10-CM

## 2023-08-22 DIAGNOSIS — Z6841 Body Mass Index (BMI) 40.0 and over, adult: Secondary | ICD-10-CM

## 2023-08-22 MED ORDER — IRON (FERROUS SULFATE) 325 (65 FE) MG PO TABS
325.0000 mg | ORAL_TABLET | Freq: Every day | ORAL | 1 refills | Status: AC
  Filled 2023-08-22: qty 100, 100d supply, fill #0
  Filled 2023-09-20 – 2023-10-26 (×2): qty 100, 100d supply, fill #1

## 2023-08-22 NOTE — Progress Notes (Signed)
[facility] [address] [place] [address] Dept: [phone] Dept Fax: [phone]    Subjective:   Deborah Mcbride [birth year] 08/22/2023  Chief Complaint  Patient presents with   Fatigue    Low energy     HPI: Deborah Mcbride presents today for re-assessment and management of chronic medical conditions.  Discussed the use of AI scribe software for clinical note transcription with the patient, who gave verbal consent to proceed.  History of Present Illness   The patient, with a history of sleep apnea, IDA, and vitamin D deficiency, presents with ongoing fatigue and sleep disturbance. They report difficulty sleeping, often falling asleep in the living room and waking up after 1 AM to go to bed with their CPAP machine. They feel they are not sleeping enough for the CPAP to make a difference.  They have a sleep medicine appt tomorrow 08/23/23. They have started taking a vitamin D supplement once weekly 3 weeks ago for their previously identified deficiency. She has not started OTC iron supplement.   The patient's fatigue is affecting their job performance and emotional stability, with frequent mood swings and bouts of crying. They deny feeling depressed.  The patient also mentions a history of chest pain and is awaiting a cardiology appointment. She was evaluated for CP on 08/09/23, EKG, Trop, and d-dimer WNL.  They have been experiencing heartburn and have been taking more medication to manage it.   They are also trying to get their Zepbound medication covered by their secondary insurance for weight management.         The following portions of the patient's history were reviewed and updated as appropriate: past medical history, past surgical history, family history, social history, allergies, medications, and problem list.   Patient Active Problem List   Diagnosis Date Noted   Iron deficiency anemia 08/01/2023    Insulin resistance 07/31/2023   Hypertriglyceridemia 07/31/2023   Vitamin D deficiency 07/31/2023   Hepatosplenomegaly 04/10/2023   Nasal congestion 01/19/2023   Sinus tachycardia 12/07/2022   OSA (obstructive sleep apnea) 10/21/2022   BMI 50.0-59.9, adult (HCC) 08/03/2022   Morbid obesity (HCC) 07/07/2022   OSA treated with BiPAP 07/07/2022   Gastroesophageal reflux disease without esophagitis 07/07/2022   Past Medical History:  Diagnosis Date   Anemia    Back pain    GERD (gastroesophageal reflux disease)    Joint pain    Obesity    Sleep apnea    Vitamin D deficiency    Past Surgical History:  Procedure Laterality Date   CESAREAN SECTION     CHOLECYSTECTOMY     TONSILLECTOMY     TUBAL LIGATION     Family History  Problem Relation Age of Onset   Diabetes Father    High blood pressure Father    Sleep apnea Father    Obesity Father     Current Outpatient Medications:    acetaminophen (TYLENOL) 500 MG tablet, Take 2 tablets (1,000 mg total) by mouth every 6 (six) hours as needed for mild pain (pain score 1-3) or moderate pain (pain score 4-6)., Disp: 60 tablet, Rfl: 0   famotidine (PEPCID) 20 MG tablet, Take 1 tablet (20 mg total) by mouth as needed for heartburn or indigestion., Disp: 90 tablet, Rfl: 1   ibuprofen (ADVIL) 800 MG tablet, Take 1 tablet (800 mg total) by mouth every 8 (eight) hours as needed for mild pain (pain score 1-3) or moderate pain (pain score  4-6)., Disp: 30 tablet, Rfl: 0   Iron, Ferrous Sulfate, 325 (65 Fe) MG TABS, Take 1 tablet (325mg ) by mouth daily., Disp: 100 tablet, Rfl: 1   omeprazole (PRILOSEC) 40 MG capsule, Take 1 capsule (40 mg total) by mouth daily as needed., Disp: 90 capsule, Rfl: 0   ondansetron (ZOFRAN) 4 MG tablet, Take 1 tablet (4 mg total) by mouth every 8 (eight) hours as needed for nausea or vomiting., Disp: 20 tablet, Rfl: 0   tirzepatide (ZEPBOUND) 2.5 MG/0.5ML Pen, Inject 2.5 mg into the skin once a week., Disp: 2 mL, Rfl:  0   Vitamin D, Ergocalciferol, (DRISDOL) 1.25 MG (50000 UNIT) CAPS capsule, Take 1 capsule (50,000 Units total) by mouth every 7 (seven) days., Disp: 5 capsule, Rfl: 0 Allergies  Allergen Reactions   Fentanyl Shortness Of Breath, Itching and Nausea And Vomiting    rash   Hydromorphone Itching   Dilaudid [Hydromorphone Hcl] Itching     ROS: A complete ROS was performed with pertinent positives/negatives noted in the HPI. The remainder of the ROS are negative.    Objective:   Today's Vitals   08/22/23 1518  BP: 120/70  Pulse: 80  Temp: 98.3 F (36.8 C)  TempSrc: Temporal  SpO2: 97%  Weight: (!) 330 lb 12.8 oz (150 kg)  Height: 5\' 3"  (1.6 m)    GENERAL: Well-appearing, in NAD. Well nourished.  SKIN: Pink, warm and dry.  NECK: Trachea midline. Full ROM w/o pain or tenderness. No lymphadenopathy.  RESPIRATORY: Chest wall symmetrical. Respirations even and non-labored. Breath sounds clear to auscultation bilaterally.  CARDIAC: S1, S2 present, regular rate and rhythm. Peripheral pulses 2+ bilaterally.  PSYCH/MENTAL STATUS: Alert, oriented x 3. Cooperative, appropriate mood and affect.   Health Maintenance Due  Topic Date Due   HIV Screening  Never done   Hepatitis C Screening  Never done   HPV VACCINES (2 - 3-dose series) 02/16/2006    No results found for any visits on 08/22/23.  The ASCVD Risk score (Arnett DK, et al., 2019) failed to calculate for the following reasons:   The 2019 ASCVD risk score is only valid for ages 75 to 69     Assessment & Plan:  Assessment and Plan    Chronic Fatigue Persistent despite ongoing treatment for sleep apnea and vitamin D deficiency. Impacting work and emotional well-being. No signs of depression reported. -Continue CPAP for sleep apnea and attend sleep consultation appointment as scheduled. -Continue once weekly Vitamin D supplement. -Start once daily iron supplement  -Consider cardiac causes if fatigue persists after sleep  apnea,. IDA, vitamin D deficiency are addressed. Patient already has cardiology appt scheduled, but is not until March 2025.   Vitamin D Deficiency Currently on once weekly supplement. -Continue once weekly Vitamin D supplement.  Sleep Apnea Patient reports inconsistent sleep and use of CPAP machine. -Continue CPAP use and attend sleep consultation appointment.  Iron Deficiency (history) Iron levels on the lower end of normal. -Start once daily iron supplement.  Weight Management Patient previously on Zepbound, currently trying to get it covered by insurance. -Continue efforts to get Zepbound covered by insurance with weight management provider.   General Health Maintenance / Followup Plans -Complete FMLA forms for employer to protect job during ongoing medical appointments and treatments.       No images are attached to the encounter or orders placed in the encounter. Meds ordered this encounter  Medications   Iron, Ferrous Sulfate, 325 (65 Fe) MG TABS  Sig: Take 1 tablet (325mg ) by mouth daily.    Dispense:  100 tablet    Refill:  1    Supervising Provider:   Garnette Gunner [[id number]]    Return for Scheduled Routine Office Visits and as needed.   Salvatore Decent, FNP

## 2023-08-23 ENCOUNTER — Telehealth: Payer: Self-pay

## 2023-08-23 DIAGNOSIS — G4733 Obstructive sleep apnea (adult) (pediatric): Secondary | ICD-10-CM | POA: Diagnosis not present

## 2023-08-23 NOTE — Telephone Encounter (Addendum)
Type of forms received: employment accommodations   Routed to:PCP  Paperwork received by : Winola Drum   Individual made aware of 3-5 business day turn around (Y/N): Y  Form completed and patient made aware of charges(Y/N):  Patient informed forms are ready for pick up by MyChart  Faxed to :   Form location:  PCP folder

## 2023-08-24 ENCOUNTER — Encounter: Payer: Self-pay | Admitting: Bariatrics

## 2023-08-24 ENCOUNTER — Ambulatory Visit: Payer: BC Managed Care – PPO | Admitting: Bariatrics

## 2023-08-24 VITALS — BP 148/79 | HR 67 | Temp 97.7°F | Ht 63.0 in | Wt 325.0 lb

## 2023-08-24 DIAGNOSIS — R632 Polyphagia: Secondary | ICD-10-CM

## 2023-08-24 DIAGNOSIS — Z6841 Body Mass Index (BMI) 40.0 and over, adult: Secondary | ICD-10-CM

## 2023-08-24 NOTE — Progress Notes (Signed)
WEIGHT SUMMARY AND BIOMETRICS  Weight Lost Since Last Visit: 0  Weight Gained Since Last Visit: 1lb   Vitals Temp: 97.7 F (36.5 C) BP: (!) 148/79 Pulse Rate: 67 SpO2: 99 %   Anthropometric Measurements Height: 5\' 3"  (1.6 m) Weight: (!) 325 lb (147.4 kg) BMI (Calculated): 57.59 Weight at Last Visit: 324lb Weight Lost Since Last Visit: 0 Weight Gained Since Last Visit: 1lb Starting Weight: 320lb Total Weight Loss (lbs): 0 lb (0 kg)   Body Composition  Body Fat %: 54.5 % Fat Mass (lbs): 177.6 lbs Muscle Mass (lbs): 140.8 lbs Total Body Water (lbs): 109 lbs Visceral Fat Rating : 21   Other Clinical Data Fasting: no Labs: no Today's Visit #: 3 Starting Date: 07/27/23    OBESITY Deborah Mcbride is here to discuss her progress with her obesity treatment plan along with follow-up of her obesity related diagnoses.    Nutrition Plan: the Category 3 plan - 50% adherence.  Current exercise: walking  Interim History:  She is up 1 lb since her last visit.  Eating all of the food on the plan., Protein intake is as prescribed, Is not skipping meals, and Water intake is adequate.   Pharmacotherapy: Sarh is in the process of getting her Zepbound 2.5 mg SQ weekly approved.  Adverse side effects: N/A Hunger is moderately controlled.  Cravings are moderately controlled.  Assessment/Plan:   Deborah Mcbride endorses excessive hunger.  Medication(s): Waiting on her Zepbound to be approved.  Appetite:  moderately controlled. Cravings are moderately controlled.   Plan: Medication(s): none, waiting on Zepbound approval.  Will increase water, protein and fiber to help assuage hunger.  Will minimize foods that have a high glucose index/load to minimize reactive hypoglycemia.  Discussed journaling and journaling numbers guide. She will use My Fitness Pal.   Healthy recipes given.    Morbid Obesity: Current BMI BMI (Calculated): 57.59   Pharmacotherapy Plan  Deborah Mcbride is currently in the action stage of change. As such, her goal is to continue with weight loss efforts.  She has agreed to the Category 3 plan.  Exercise goals: All adults should avoid inactivity. Some physical activity is better than none, and adults who participate in any amount of physical activity gain some health benefits.  Behavioral modification strategies: increasing lean protein intake, decreasing simple carbohydrates , no meal skipping, decrease eating out, meal planning , better snacking choices, planning for success, get rid of junk food in the home, decrease snacking , avoiding temptations, keep healthy foods in the home, weigh protein portions, and mindful eating.  Deborah Mcbride has agreed to follow-up with our clinic in 2 weeks.      Objective:   VITALS: Per patient if applicable, see vitals. GENERAL: Alert and in no acute distress. CARDIOPULMONARY: No increased WOB. Speaking in clear sentences.  PSYCH: Pleasant and cooperative. Speech normal rate and rhythm. Affect  is appropriate. Insight and judgement are appropriate. Attention is focused, linear, and appropriate.  NEURO: Oriented as arrived to appointment on time with no prompting.   Attestation Statements:    This was prepared with the assistance of Engineer, civil (consulting).  Occasional wrong-word or sound-a-like substitutions may have occurred due to the inherent limitations of voice recognition   Corinna Capra, DO

## 2023-08-28 ENCOUNTER — Encounter: Payer: Self-pay | Admitting: Internal Medicine

## 2023-08-28 NOTE — Telephone Encounter (Signed)
Needs office visit for further evaluation.

## 2023-08-29 NOTE — Telephone Encounter (Signed)
Patient was informed.

## 2023-08-29 NOTE — Telephone Encounter (Signed)
Called patient to inform of message and to schedule an office visit but was disconnected

## 2023-08-31 ENCOUNTER — Other Ambulatory Visit (HOSPITAL_COMMUNITY): Payer: Self-pay

## 2023-09-01 ENCOUNTER — Other Ambulatory Visit: Payer: Self-pay | Admitting: Internal Medicine

## 2023-09-01 ENCOUNTER — Other Ambulatory Visit (HOSPITAL_COMMUNITY): Payer: Self-pay

## 2023-09-01 MED ORDER — OMEPRAZOLE 40 MG PO CPDR
40.0000 mg | DELAYED_RELEASE_CAPSULE | Freq: Every day | ORAL | 0 refills | Status: DC | PRN
  Filled 2023-09-01: qty 90, 90d supply, fill #0

## 2023-09-01 MED ORDER — IBUPROFEN 800 MG PO TABS
800.0000 mg | ORAL_TABLET | Freq: Three times a day (TID) | ORAL | 0 refills | Status: DC | PRN
  Filled 2023-09-01: qty 30, 10d supply, fill #0

## 2023-09-04 ENCOUNTER — Other Ambulatory Visit (HOSPITAL_COMMUNITY): Payer: Self-pay

## 2023-09-05 ENCOUNTER — Ambulatory Visit: Payer: BC Managed Care – PPO | Admitting: Internal Medicine

## 2023-09-05 ENCOUNTER — Other Ambulatory Visit (HOSPITAL_COMMUNITY): Payer: Self-pay

## 2023-09-05 ENCOUNTER — Encounter: Payer: Self-pay | Admitting: Internal Medicine

## 2023-09-05 ENCOUNTER — Other Ambulatory Visit: Payer: Self-pay | Admitting: Internal Medicine

## 2023-09-05 ENCOUNTER — Ambulatory Visit: Payer: BC Managed Care – PPO | Attending: Internal Medicine

## 2023-09-05 VITALS — BP 128/72 | HR 78 | Temp 98.3°F | Ht 63.0 in | Wt 328.0 lb

## 2023-09-05 DIAGNOSIS — R001 Bradycardia, unspecified: Secondary | ICD-10-CM

## 2023-09-05 DIAGNOSIS — M79652 Pain in left thigh: Secondary | ICD-10-CM | POA: Diagnosis not present

## 2023-09-05 DIAGNOSIS — G43701 Chronic migraine without aura, not intractable, with status migrainosus: Secondary | ICD-10-CM

## 2023-09-05 DIAGNOSIS — K219 Gastro-esophageal reflux disease without esophagitis: Secondary | ICD-10-CM | POA: Diagnosis not present

## 2023-09-05 MED ORDER — UBRELVY 100 MG PO TABS
1.0000 | ORAL_TABLET | ORAL | 2 refills | Status: DC | PRN
  Filled 2023-09-05: qty 8, 30d supply, fill #0
  Filled 2023-09-09: qty 16, 8d supply, fill #0
  Filled 2023-09-20: qty 16, 8d supply, fill #1
  Filled 2023-10-26 – 2023-11-21 (×2): qty 16, 8d supply, fill #2

## 2023-09-05 MED ORDER — OMEPRAZOLE 40 MG PO CPDR: 40.0000 mg | DELAYED_RELEASE_CAPSULE | Freq: Two times a day (BID) | ORAL | Status: DC

## 2023-09-05 NOTE — Progress Notes (Signed)
 [facility] [address] [place] [address] Dept: [phone] Dept Fax: [phone]  Acute Care Office Visit  Subjective:   Deborah Mcbride  Deborah Mcbride July 12, 1986 09/05/2023  Chief Complaint  Patient presents with   Follow-up    Pain in left thigh     HPI: Discussed the use of AI scribe software for clinical note transcription with the patient, who gave verbal consent to proceed.  History of Present Illness   The patient, with a history of sleep apnea, presents with multiple concerns. The chief complaint is pain in the left thigh, described as feeling like a really bad bruise or a really bad cut, although there is no visible injury or discoloration. The pain, which started a week ago, is localized to the top portion of the left thigh and is associated with intermittent numbness and a burning sensation. The patient denies any recent injury, swelling, or warmth in the area.  The patient also reports episodes of bradycardia, with heart rate dropping to the forties, as noted on her watch and pulse oximeter. These episodes have occurred three times in the past month, with one episode associated with lightheadedness and dizziness. The patient denies any associated chest pain, palpitations, or syncope during these episodes. She has been standing when these episodes occur. No changes in physical activity.   The patient also complains of worsening acid reflux, which is not relieved by Pepcid , omeprazole , or Tums. The heartburn is described as extremely bad and is not related to food intake. She has been prescribed Zepbound , but has not started it yet.    The patient also mentions a history of chronic migraines, which occur every other day or sometimes daily. The patient has previously been on Ubrelvy  for the migraines and found it helpful. She reports she has been on various medications for migraine management: Topamax, imitrex, Aimovig . No  relief with topamax or imitrex. She did find aimovig  to be helpful.         The following portions of the patient's history were reviewed and updated as appropriate: past medical history, past surgical history, family history, social history, allergies, medications, and problem list.   Patient Active Problem List   Diagnosis Date Noted   Chronic migraine without aura with status migrainosus, not intractable 09/05/2023   Iron  deficiency anemia 08/01/2023   Insulin  resistance 07/31/2023   Hypertriglyceridemia 07/31/2023   Vitamin D  deficiency 07/31/2023   Hepatosplenomegaly 04/10/2023   Nasal congestion 01/19/2023   Sinus tachycardia 12/07/2022   OSA (obstructive sleep apnea) 10/21/2022   BMI 50.0-59.9, adult (HCC) 08/03/2022   Morbid obesity (HCC) 07/07/2022   OSA treated with BiPAP 07/07/2022   Gastroesophageal reflux disease without esophagitis 07/07/2022   Past Medical History:  Diagnosis Date   Anemia    Back pain    GERD (gastroesophageal reflux disease)    Joint pain    Obesity    Sleep apnea    Vitamin D  deficiency    Past Surgical History:  Procedure Laterality Date   CESAREAN SECTION     CHOLECYSTECTOMY     TONSILLECTOMY     TUBAL LIGATION     Family History  Problem Relation Age of Onset   Diabetes Father    High blood pressure Father    Sleep apnea Father    Obesity Father     Current Outpatient Medications:    Ubrogepant  (UBRELVY ) 100 MG TABS, Take 1 tablet by mouth at onset of migraine. May repeat dose in  2 hours if migraine persists. Max daily dose 2 tablets/day., Disp: 16 tablet, Rfl: 2   acetaminophen  (TYLENOL ) 500 MG tablet, Take 2 tablets (1,000 mg total) by mouth every 6 (six) hours as needed for mild pain (pain score 1-3) or moderate pain (pain score 4-6)., Disp: 60 tablet, Rfl: 0   famotidine  (PEPCID ) 20 MG tablet, Take 1 tablet (20 mg total) by mouth as needed for heartburn or indigestion., Disp: 90 tablet, Rfl: 1   ibuprofen  (ADVIL ) 800 MG  tablet, Take 1 tablet (800 mg total) by mouth every 8 (eight) hours as needed for mild pain (pain score 1-3) or moderate pain (pain score 4-6)., Disp: 30 tablet, Rfl: 0   Iron , Ferrous Sulfate , 325 (65 Fe) MG TABS, Take 1 tablet (325mg ) by mouth daily., Disp: 100 tablet, Rfl: 1   omeprazole  (PRILOSEC) 40 MG capsule, Take 1 capsule (40 mg total) by mouth 2 (two) times daily., Disp: , Rfl:    ondansetron  (ZOFRAN ) 4 MG tablet, Take 1 tablet (4 mg total) by mouth every 8 (eight) hours as needed for nausea or vomiting., Disp: 20 tablet, Rfl: 0   tirzepatide  (ZEPBOUND ) 2.5 MG/0.5ML Pen, Inject 2.5 mg into the skin once a week. (Patient not taking: Reported on 08/24/2023), Disp: 2 mL, Rfl: 0   Vitamin D , Ergocalciferol , (DRISDOL ) 1.25 MG (50000 UNIT) CAPS capsule, Take 1 capsule (50,000 Units total) by mouth every 7 (seven) days., Disp: 5 capsule, Rfl: 0 Allergies  Allergen Reactions   Fentanyl  Shortness Of Breath, Itching and Nausea And Vomiting    rash   Hydromorphone  Itching   Dilaudid  [Hydromorphone  Hcl] Itching     ROS: A complete ROS was performed with pertinent positives/negatives noted in the HPI. The remainder of the ROS are negative.    Objective:   Today's Vitals   09/05/23 1542  BP: 128/72  Pulse: 78  Temp: 98.3 F (36.8 C)  TempSrc: Temporal  SpO2: 98%  Weight: (!) 328 lb (148.8 kg)  Height: 5' 3 (1.6 m)    GENERAL: Well-appearing, in NAD. Well nourished.  SKIN: Pink, warm and dry. No rash, lesion, ulceration, or ecchymoses.  NECK: Trachea midline. Full ROM w/o pain or tenderness. No lymphadenopathy.  RESPIRATORY: Chest wall symmetrical. Respirations even and non-labored. Breath sounds clear to auscultation bilaterally.  CARDIAC: S1, S2 present, regular rate and rhythm. Peripheral pulses 2+ bilaterally.  EXTREMITIES: Without clubbing, cyanosis, or edema.  NEUROLOGIC: No motor or sensory deficits. Steady, even gait.  PSYCH/MENTAL STATUS: Alert, oriented x 3. Cooperative,  appropriate mood and affect.    No results found for any visits on 09/05/23.    Assessment & Plan:  Assessment and Plan    Left Thigh Pain Unexplained pain in the left thigh, described as a bruise-like sensation with no visible signs of injury or inflammation. No associated back pain or injury reported. -Apply heating pad and take ibuprofen  as needed for pain relief.  Bradycardia Episodes of heart rate dropping into the 40s, as reported by the patient's watch. No associated symptoms of lightheadedness, fatigue, or chest pain reported. -Order a long-term Xyopatch for monitoring.  Gastroesophageal Reflux Disease (GERD) Worsening acid reflux symptoms, not responding to current treatment with Pepcid  and omeprazole . -Increase omeprazole  to 40mg  twice daily for six weeks.  Chronic Migraines Frequent migraines, occurring every other day or daily. Previous successful treatment with Ubrelvy . -Reinitiate Ubrelvy , one tablet at the onset of a migraine, repeat in two hours if migraine persists.  Follow-up in six weeks to reassess acid  reflux, migraines, vitamin D  deficiency, and iron  deficiency.      Meds ordered this encounter  Medications   omeprazole  (PRILOSEC) 40 MG capsule    Sig: Take 1 capsule (40 mg total) by mouth 2 (two) times daily.    Supervising Provider:   THOMPSON, AARON B [[id number]]   Ubrogepant  (UBRELVY ) 100 MG TABS    Sig: Take 1 tablet by mouth at onset of migraine. May repeat dose in 2 hours if migraine persists. Max daily dose 2 tablets/day.    Dispense:  16 tablet    Refill:  2    Supervising Provider:   THOMPSON, AARON B [[id number]]   Orders Placed This Encounter  Procedures   LONG TERM MONITOR (3-14 DAYS)    Standing Status:   Future    Number of Occurrences:   1    Expiration Date:   09/04/2024    Where should this test be performed?:   [facility]    Does the patient have an implanted cardiac device?:   No    Prescribed days of wear:   14    Type of  enrollment:   Home Enrollment    Vendor::   [employer]   Lab Orders  No laboratory test(s) ordered today   No images are attached to the encounter or orders placed in the encounter.  Return in about 6 weeks (around 10/17/2023) for Acid Reflux, migraines, vitamin d  def, iron  deficiency.   Rosina Senters, FNP

## 2023-09-05 NOTE — Progress Notes (Unsigned)
 EP to read.

## 2023-09-05 NOTE — Patient Instructions (Signed)
Acid Reflux:  Take omeprazole 40mg  two times a day for 6 weeks.   Thigh Pain:  Ibuprofen  Heating pad

## 2023-09-06 ENCOUNTER — Encounter: Payer: Self-pay | Admitting: Internal Medicine

## 2023-09-06 ENCOUNTER — Other Ambulatory Visit (HOSPITAL_COMMUNITY): Payer: Self-pay

## 2023-09-06 MED ORDER — OMEPRAZOLE 40 MG PO CPDR
40.0000 mg | DELAYED_RELEASE_CAPSULE | Freq: Two times a day (BID) | ORAL | 1 refills | Status: DC
  Filled 2023-09-06 – 2023-09-27 (×3): qty 60, 30d supply, fill #0
  Filled 2023-10-30: qty 60, 30d supply, fill #1

## 2023-09-06 NOTE — Telephone Encounter (Signed)
 I can double check the paperwork, but I believe there is pages that it requested the employee fill out, not the provider.

## 2023-09-08 ENCOUNTER — Telehealth: Payer: Self-pay

## 2023-09-08 ENCOUNTER — Other Ambulatory Visit (HOSPITAL_COMMUNITY): Payer: Self-pay

## 2023-09-08 MED ORDER — ZEPBOUND 2.5 MG/0.5ML ~~LOC~~ SOAJ: 2.5000 mg | SUBCUTANEOUS | 0 refills | Status: DC

## 2023-09-08 MED ORDER — ZEPBOUND 7.5 MG/0.5ML ~~LOC~~ SOAJ: 7.5000 mg | SUBCUTANEOUS | 4 refills | Status: DC

## 2023-09-08 MED ORDER — FAMOTIDINE 40 MG PO TABS
40.0000 mg | ORAL_TABLET | Freq: Every day | ORAL | 1 refills | Status: AC
  Filled 2023-09-20: qty 90, 90d supply, fill #0
  Filled 2023-10-30 – 2023-11-27 (×2): qty 90, 90d supply, fill #1

## 2023-09-08 MED ORDER — NORETHINDRONE ACETATE 5 MG PO TABS
2.5000 mg | ORAL_TABLET | Freq: Every day | ORAL | 3 refills | Status: DC
  Filled 2023-09-20 – 2023-09-27 (×2): qty 30, 60d supply, fill #0

## 2023-09-08 MED ORDER — VITAMIN D (ERGOCALCIFEROL) 50 MCG (2000 UT) PO CAPS: 50000.0000 [IU] | ORAL_CAPSULE | ORAL | 1 refills | Status: DC

## 2023-09-08 NOTE — Telephone Encounter (Signed)
 Pharmacy Patient Advocate Encounter   Received notification from  [facility] Portal that prior authorization for Ubrelvy  100MG  tablets is required/requested.   Insurance verification completed.   The patient is insured through [facility] [place] .   Per test claim: PA required; PA submitted to above mentioned insurance via CoverMyMeds Key/confirmation #/EOC  [id number] Status is pending

## 2023-09-09 ENCOUNTER — Other Ambulatory Visit (HOSPITAL_COMMUNITY): Payer: Self-pay

## 2023-09-09 DIAGNOSIS — R001 Bradycardia, unspecified: Secondary | ICD-10-CM | POA: Diagnosis not present

## 2023-09-11 ENCOUNTER — Ambulatory Visit: Payer: BC Managed Care – PPO | Admitting: Bariatrics

## 2023-09-11 ENCOUNTER — Other Ambulatory Visit (HOSPITAL_COMMUNITY): Payer: Self-pay

## 2023-09-11 DIAGNOSIS — G4733 Obstructive sleep apnea (adult) (pediatric): Secondary | ICD-10-CM | POA: Diagnosis not present

## 2023-09-11 NOTE — Telephone Encounter (Signed)
 Hi Deborah Mcbride ,  You may already know but your Zepbound  was approved from 08/16/23-03/03/2024.

## 2023-09-11 NOTE — Telephone Encounter (Signed)
 Pharmacy Patient Advocate Encounter  Received notification from [facility] Medicaid that Prior Authorization for Ubrelvy  100 mg tablets has been DENIED.  See denial reason below. No denial letter attached in CMM. Will attach denial letter to Media tab once received.   PA #/Case ID/Reference #: [id number]

## 2023-09-13 ENCOUNTER — Ambulatory Visit: Payer: BC Managed Care – PPO | Admitting: Family Medicine

## 2023-09-14 ENCOUNTER — Ambulatory Visit (INDEPENDENT_AMBULATORY_CARE_PROVIDER_SITE_OTHER): Payer: BC Managed Care – PPO | Admitting: Family Medicine

## 2023-09-14 ENCOUNTER — Encounter: Payer: Self-pay | Admitting: Family Medicine

## 2023-09-14 ENCOUNTER — Other Ambulatory Visit (HOSPITAL_COMMUNITY): Payer: Self-pay

## 2023-09-14 VITALS — BP 137/73 | HR 64 | Temp 97.8°F | Ht 63.0 in | Wt 319.0 lb

## 2023-09-14 DIAGNOSIS — R001 Bradycardia, unspecified: Secondary | ICD-10-CM | POA: Diagnosis not present

## 2023-09-14 DIAGNOSIS — E559 Vitamin D deficiency, unspecified: Secondary | ICD-10-CM

## 2023-09-14 DIAGNOSIS — Z6841 Body Mass Index (BMI) 40.0 and over, adult: Secondary | ICD-10-CM

## 2023-09-14 DIAGNOSIS — R632 Polyphagia: Secondary | ICD-10-CM | POA: Diagnosis not present

## 2023-09-14 DIAGNOSIS — E66813 Obesity, class 3: Secondary | ICD-10-CM

## 2023-09-14 MED ORDER — ZEPBOUND 5 MG/0.5ML ~~LOC~~ SOAJ
5.0000 mg | SUBCUTANEOUS | 0 refills | Status: DC
  Filled 2023-09-14 – 2023-09-27 (×3): qty 2, 28d supply, fill #0

## 2023-09-14 NOTE — Progress Notes (Signed)
Office: [phone]  /  Fax: [phone]  WEIGHT SUMMARY AND BIOMETRICS  Starting Date: 07/27/23  Starting Weight: 320lb   Weight Lost Since Last Visit: 6lb   Vitals Temp: 97.8 F (36.6 C) BP: 137/73 Pulse Rate: 64 SpO2: 97 %   Body Composition  Body Fat %: 52.2 % Fat Mass (lbs): 166.6 lbs Muscle Mass (lbs): 145 lbs Total Body Water (lbs): 102.8 lbs Visceral Fat Rating : 20     HPI  Chief Complaint: OBESITY  Deborah Mcbride is here to discuss her progress with her obesity treatment plan. She is on the the Category 3 Plan and states she is following her eating plan approximately 60 % of the time. She states she is exercising 0 minutes 0 times per week.   Interval History:  Since last office visit she is down 6 lb She is up 4.2 pounds of muscle mass and down 11 pounds of body fat since her last visit She started on Zepbound 2.5 mg-- did 2 injections so far She is starting to feel more satiety and denies nausea or heartburn It has been hard to plan meals due to her husband's work schedule She typically has a protein shake ([employer]) for breakfast, a little for lunch and dinner with her family.  She is still snacking at night She started tracking steps with a Fitbit She is trying out new foods but gets bored easily.  She previously failed to see adequate weight loss on Wegovy  Pharmacotherapy: Zepbound 2.5 mg once weekly injection  PHYSICAL EXAM:  Blood pressure 137/73, pulse 64, temperature 97.8 F (36.6 C), height 5\' 3"  (1.6 m), weight (!) 319 lb (144.7 kg), SpO2 97%. Body mass index is 56.51 kg/m.  General: She is overweight, cooperative, alert, well developed, and in no acute distress. PSYCH: Has normal mood, affect and thought process.   Lungs: Normal breathing effort, no conversational dyspnea.   ASSESSMENT AND PLAN  TREATMENT PLAN FOR OBESITY:  Recommended Dietary Goals  Deborah Mcbride is currently in the action stage of change. As such, her goal is to  continue weight management plan. She has agreed to the Category 3 Plan. Continue protein shake for breakfast as meal replacement but add on a fresh fruit serving We discussed lunch options instead of meal skipping to include a lean protein and fiber source Reviewed easy healthy options for dinners including websites for recipe ideas Seasoning handout given Behavioral Intervention  We discussed the following Behavioral Modification Strategies today: increasing lean protein intake to established goals, increasing vegetables, increasing fiber rich foods, increasing water intake , work on meal planning and preparation, reading food labels , keeping healthy foods at home, identifying sources and decreasing liquid calories, practice mindfulness eating and understand the difference between hunger signals and cravings, continue to practice mindfulness when eating, planning for success, better snacking choices, and continue to work on maintaining a reduced calorie state, getting the recommended amount of protein, incorporating whole foods, making healthy choices, staying well hydrated and practicing mindfulness when eating..  Additional resources provided today: NA  Recommended Physical Activity Goals  Deborah Mcbride has been advised to work up to 150 minutes of moderate intensity aerobic activity a week and strengthening exercises 2-3 times per week for cardiovascular health, weight loss maintenance and preservation of muscle mass.   She has agreed to Start aerobic activity with a goal of 150 minutes a week at moderate intensity.  Track daily steps with a goal of over 7000 daily  Pharmacotherapy changes for the  treatment of obesity: Increase next dose of Zepbound to 5 mg once weekly injections  ASSOCIATED CONDITIONS ADDRESSED TODAY  Polyphagia Improving on Zepbound Has room for improvement increasing intake of lean protein and fiber with meals Increase water intake including sugar-free options to 100  ounces daily Complete all 4 injections of Zepbound 2.5 mg once weekly injection prior to increasing to the 5 mg dose  Class 3 severe obesity due to excess calories with body mass index (BMI) of 50.0 to 59.9 in adult, unspecified whether serious comorbidity present (HCC) -     Zepbound; Inject 5 mg into the skin once a week.  Dispense: 2 mL; Refill: 0  Bradycardia Currently wearing a cardiac monitor.  The bradycardia found on her smart watch.  She had 1 brief period of dizziness while in church.  She is scheduled for follow-up with Dr.Patwardhan 3/13  Vitamin D deficiency Last vitamin D Lab Results  Component Value Date   VD25OH 14.6 (L) 07/27/2023   She is taking vitamin D 50,000 IU once weekly for profound vitamin D deficiency.  She still has complaints of fatigue.  She is tolerating this well.  Recheck vitamin D level in the next 1 to 2 months     She was informed of the importance of frequent follow up visits to maximize her success with intensive lifestyle modifications for her multiple health conditions.   ATTESTASTION STATEMENTS:  Reviewed by clinician on day of visit: allergies, medications, problem list, medical history, surgical history, family history, social history, and previous encounter notes pertinent to obesity diagnosis.   I have personally spent 30 minutes total time today in preparation, patient care, nutritional counseling and documentation for this visit, including the following: review of clinical lab tests; review of medical tests/procedures/services.      Deborah Brink, DO DABFM, DABOM [facility] [address] [place], [place] [address] [phone]

## 2023-09-14 NOTE — Telephone Encounter (Signed)
Provider folder at front desk was checked for paperwork daily; no paperwork was dropped off or received by me.

## 2023-09-15 ENCOUNTER — Other Ambulatory Visit (HOSPITAL_COMMUNITY): Payer: Self-pay

## 2023-09-20 ENCOUNTER — Other Ambulatory Visit: Payer: Self-pay

## 2023-09-20 ENCOUNTER — Other Ambulatory Visit: Payer: Self-pay | Admitting: Bariatrics

## 2023-09-20 ENCOUNTER — Other Ambulatory Visit: Payer: Self-pay | Admitting: Internal Medicine

## 2023-09-20 ENCOUNTER — Other Ambulatory Visit (HOSPITAL_COMMUNITY): Payer: Self-pay

## 2023-09-20 DIAGNOSIS — E66813 Obesity, class 3: Secondary | ICD-10-CM

## 2023-09-20 DIAGNOSIS — E559 Vitamin D deficiency, unspecified: Secondary | ICD-10-CM

## 2023-09-20 DIAGNOSIS — R632 Polyphagia: Secondary | ICD-10-CM

## 2023-09-20 DIAGNOSIS — E781 Pure hyperglyceridemia: Secondary | ICD-10-CM

## 2023-09-20 DIAGNOSIS — E88819 Insulin resistance, unspecified: Secondary | ICD-10-CM

## 2023-09-20 MED ORDER — ACETAMINOPHEN 500 MG PO TABS
1000.0000 mg | ORAL_TABLET | Freq: Four times a day (QID) | ORAL | 0 refills | Status: DC | PRN
  Filled 2023-09-20: qty 100, 13d supply, fill #0

## 2023-09-20 MED ORDER — IBUPROFEN 800 MG PO TABS
800.0000 mg | ORAL_TABLET | Freq: Three times a day (TID) | ORAL | 0 refills | Status: DC | PRN
  Filled 2023-09-20: qty 30, 10d supply, fill #0

## 2023-09-21 ENCOUNTER — Other Ambulatory Visit (HOSPITAL_COMMUNITY): Payer: Self-pay

## 2023-09-25 DIAGNOSIS — G4733 Obstructive sleep apnea (adult) (pediatric): Secondary | ICD-10-CM | POA: Diagnosis not present

## 2023-09-26 ENCOUNTER — Emergency Department (HOSPITAL_COMMUNITY)
Admission: EM | Admit: 2023-09-26 | Discharge: 2023-09-26 | Discharge status: Against Medical Advice | Payer: BC Managed Care – PPO | Attending: Emergency Medicine | Admitting: Emergency Medicine

## 2023-09-26 ENCOUNTER — Encounter (HOSPITAL_COMMUNITY): Payer: Self-pay

## 2023-09-26 ENCOUNTER — Other Ambulatory Visit: Payer: Self-pay

## 2023-09-26 ENCOUNTER — Telehealth: Payer: Self-pay

## 2023-09-26 ENCOUNTER — Ambulatory Visit: Payer: Self-pay | Admitting: Internal Medicine

## 2023-09-26 DIAGNOSIS — I499 Cardiac arrhythmia, unspecified: Secondary | ICD-10-CM | POA: Diagnosis not present

## 2023-09-26 DIAGNOSIS — R42 Dizziness and giddiness: Secondary | ICD-10-CM | POA: Diagnosis not present

## 2023-09-26 DIAGNOSIS — R001 Bradycardia, unspecified: Secondary | ICD-10-CM | POA: Diagnosis not present

## 2023-09-26 DIAGNOSIS — Z5321 Procedure and treatment not carried out due to patient leaving prior to being seen by health care provider: Secondary | ICD-10-CM | POA: Diagnosis not present

## 2023-09-26 NOTE — Telephone Encounter (Signed)
 Copied from CRM [id number]. Topic: Clinical - Lab/Test Results >> Sep 26, 2023  8:49 AM Fuller Mandril wrote: Reason for CRM: Patient calling to check status of heart monitor results. Sent it in on the 20th. Thank You

## 2023-09-26 NOTE — Telephone Encounter (Signed)
 I called patient and left a detailed message regarding Salvatore Decent, NP response.

## 2023-09-26 NOTE — Telephone Encounter (Signed)
 I called patient to verify that she was going to the Ed to seek care and got her voicemail and left her a message.

## 2023-09-26 NOTE — Telephone Encounter (Signed)
 I called and spoke with patient and she was wanting results from heart monitor. She said she mailed it back on February 20th or 21st. Patient is also concerned with having an episode that she feels that her heart had an extra beat when walking at [facility], She said that she had no pain and when she got home she check her heart rate it was normal then went down to 48 to low 50's. She said that she started to feel weird later and heart rate went to 39-40's.  Patient questions what else can she do and would like results too.

## 2023-09-26 NOTE — Telephone Encounter (Signed)
 Copied from CRM [id number]. Topic: Clinical - Red Word Triage >> Sep 26, 2023 11:10 AM Denese Killings wrote: Red Word that prompted transfer to Nurse Triage: Patient wore a heart monitor for 14 days and now that she is not on it. Her heart rate dropped to the 40's.  Chief Complaint: low heart rate in the 30's, 40's and 50's Symptoms: lightheaded and dizziness Frequency: constant but does come and goes Pertinent Negatives: Patient denies cp, sob,  Disposition: [x] ED /[] Urgent Care (no appt availability in office) / [] Appointment(In office/virtual)/ []  [facility]/ [] Home Care/ [] Refused Recommended Disposition /[] [facility]/ []  Follow-up with PCP Additional Notes: Instructed to go to the ER.  States at [facility] last night her heart rate was in the 30's.  Pcp office updated.   Reason for Disposition  [1] Dizziness, lightheadedness, or weakness AND [2] heart beating very slowly (e.g., < 50 / minute)  Answer Assessment - Initial Assessment Questions 1. DESCRIPTION: "Please describe your heart rate or heartbeat that you are having" (e.g., fast/slow, regular/irregular, skipped or extra beats, "palpitations")     Was on a heart monitor for slow heart rate. 2. ONSET: "When did it start?" (Minutes, hours or days)      Last month 3. DURATION: "How long does it last" (e.g., seconds, minutes, hours)     hours 4. PATTERN "Does it come and go, or has it been constant since it started?"  "Does it get worse with exertion?"   "Are you feeling it now?"     Felt like a flutter and she coughed and it did not help. 5. TAP: "Using your hand, can you tap out what you are feeling on a chair or table in front of you, so that I can hear?" (Note: not all patients can do this)       Used pulse ox to check heart rate 6. HEART RATE: "Can you tell me your heart rate?" "How many beats in 15 seconds?"  (Note: not all patients can do this)       40's and 50's 7. RECURRENT SYMPTOM: "Have you ever had this  before?" If Yes, ask: "When was the last time?" and "What happened that time?"      Yes, was on a heart monitor 8. CAUSE: "What do you think is causing the palpitations?"     unknown 9. CARDIAC HISTORY: "Do you have any history of heart disease?" (e.g., heart attack, angina, bypass surgery, angioplasty, arrhythmia)      denies 10. OTHER SYMPTOMS: "Do you have any other symptoms?" (e.g., dizziness, chest pain, sweating, difficulty breathing)       Dizzy and lightheaded 11. PREGNANCY: "Is there any chance you are pregnant?" "When was your last menstrual period?"       NA  Protocols used: Heart Rate and Heartbeat Questions-A-AH

## 2023-09-26 NOTE — ED Triage Notes (Signed)
 Pt here from home. C/O dizziness and heart "flutter". Pt states HR was 25 and has happened before. EMS states HR in 70's. Axox4. VSS.

## 2023-09-27 ENCOUNTER — Other Ambulatory Visit (HOSPITAL_COMMUNITY): Payer: Self-pay

## 2023-09-27 ENCOUNTER — Encounter: Payer: Self-pay | Admitting: Internal Medicine

## 2023-09-27 ENCOUNTER — Other Ambulatory Visit: Payer: Self-pay

## 2023-09-27 ENCOUNTER — Telehealth: Payer: Self-pay

## 2023-09-27 DIAGNOSIS — R001 Bradycardia, unspecified: Secondary | ICD-10-CM | POA: Diagnosis not present

## 2023-09-27 NOTE — Transitions of Care (Post Inpatient/ED Visit) (Signed)
   09/27/2023  Name: [name] MRN: [id number] DOB: May 02, 1986  Today's TOC FU Call Status: Today's TOC FU Call Status:: Unsuccessful Call (1st Attempt) Unsuccessful Call (1st Attempt) Date: 09/27/23 (call disconnected)  Attempted to reach the patient regarding the most recent Inpatient/ED visit.  Follow Up Plan: Additional outreach attempts will be made to reach the patient to complete the Transitions of Care (Post Inpatient/ED visit) call.   Signature Agnes Lawrence, CMA (AAMA)  [facility] [phone]

## 2023-10-03 ENCOUNTER — Other Ambulatory Visit (HOSPITAL_COMMUNITY): Payer: Self-pay

## 2023-10-09 ENCOUNTER — Encounter: Payer: Self-pay | Admitting: Internal Medicine

## 2023-10-09 ENCOUNTER — Telehealth: Payer: Self-pay

## 2023-10-09 NOTE — Telephone Encounter (Signed)
 Please advise   Copied from CRM [id number]. Topic: Clinical - Lab/Test Results >> Oct 09, 2023  1:28 PM Almira Coaster wrote: Reason for CRM: Patient is calling for the results of her 14 day heart monitor. She states she can view them on mychart but isn't able to read the results. Best call back number is [phone].

## 2023-10-11 DIAGNOSIS — R072 Precordial pain: Secondary | ICD-10-CM | POA: Insufficient documentation

## 2023-10-11 NOTE — Progress Notes (Unsigned)
  Cardiology Office Note:  .   Date:  10/11/2023  ID:  Deborah Mcbride, DOB 1986-05-28, MRN [id number] PCP: Salvatore Decent, FNP  [facility] Providers Cardiologist:  Truett Mainland, MD PCP: Salvatore Decent, FNP  No chief complaint on file.     History of Present Illness: .    Deborah Mcbride is a 38 y.o. female with ***  There were no vitals filed for this visit.   ROS: *** ROS   Studies Reviewed: .        *** Independently interpreted 08/2023: Chol 181, TG 192, HDL 42, LDL 106 HbA1C 5.5% Hb 12.5 Cr 0.7 Trop HS 3  10-day monitor 09/2023: 1 episode SVT 7 beats <1% PAC/PVC No other arrhtymias  Risk Assessment/Calculations:   {Does this patient have ATRIAL FIBRILLATION?:[phone]}     Physical Exam:   Physical Exam   VISIT DIAGNOSES: No diagnosis found.   ASSESSMENT AND PLAN: .    Deborah Mcbride is a 38 y.o. female with ***  {Are you ordering a CV Procedure (e.g. stress test, cath, DCCV, TEE, etc)?   Press F2        :[id number]}    No orders of the defined types were placed in this encounter.    F/u in ***  Signed, Elder Negus, MD

## 2023-10-12 ENCOUNTER — Ambulatory Visit: Payer: BC Managed Care – PPO | Admitting: Family Medicine

## 2023-10-12 ENCOUNTER — Encounter: Payer: Self-pay | Admitting: Cardiology

## 2023-10-12 ENCOUNTER — Ambulatory Visit: Payer: BC Managed Care – PPO | Attending: Cardiology | Admitting: Cardiology

## 2023-10-12 VITALS — BP 126/80 | HR 67 | Ht 63.0 in | Wt 324.2 lb

## 2023-10-12 DIAGNOSIS — R002 Palpitations: Secondary | ICD-10-CM | POA: Diagnosis not present

## 2023-10-12 DIAGNOSIS — E7800 Pure hypercholesterolemia, unspecified: Secondary | ICD-10-CM | POA: Insufficient documentation

## 2023-10-12 DIAGNOSIS — R072 Precordial pain: Secondary | ICD-10-CM

## 2023-10-12 NOTE — Patient Instructions (Signed)
 Medication Instructions:   Your physician recommends that you continue on your current medications as directed. Please refer to the Current Medication list given to you today.  *If you need a refill on your cardiac medications before your next appointment, please call your pharmacy*    Testing/Procedures:  CARDIAC CALCIUM SCORE (SELF PAY)    Follow-Up:  AS NEEDED WITH DR. PATWARDHAN  Other Instructions    1st Floor: - Lobby - Registration  - Pharmacy  - Lab - Cafe  2nd Floor: - PV Lab - Diagnostic Testing (echo, CT, nuclear med)  3rd Floor: - Vacant  4th Floor: - TCTS (cardiothoracic surgery) - AFib Clinic - Structural Heart Clinic - Vascular Surgery  - Vascular Ultrasound  5th Floor: - HeartCare Cardiology (general and EP) - Clinical Pharmacy for coumadin, hypertension, lipid, weight-loss medications, and med management appointments    Valet parking services will be available as well.

## 2023-10-17 ENCOUNTER — Encounter: Payer: Self-pay | Admitting: Internal Medicine

## 2023-10-17 ENCOUNTER — Other Ambulatory Visit (HOSPITAL_COMMUNITY): Payer: Self-pay

## 2023-10-17 ENCOUNTER — Ambulatory Visit (INDEPENDENT_AMBULATORY_CARE_PROVIDER_SITE_OTHER): Payer: BC Managed Care – PPO | Admitting: Internal Medicine

## 2023-10-17 VITALS — BP 106/68 | HR 75 | Temp 98.1°F | Ht 63.0 in | Wt 322.6 lb

## 2023-10-17 DIAGNOSIS — E559 Vitamin D deficiency, unspecified: Secondary | ICD-10-CM

## 2023-10-17 DIAGNOSIS — G43701 Chronic migraine without aura, not intractable, with status migrainosus: Secondary | ICD-10-CM | POA: Diagnosis not present

## 2023-10-17 DIAGNOSIS — D509 Iron deficiency anemia, unspecified: Secondary | ICD-10-CM

## 2023-10-17 DIAGNOSIS — Z566 Other physical and mental strain related to work: Secondary | ICD-10-CM

## 2023-10-17 DIAGNOSIS — K219 Gastro-esophageal reflux disease without esophagitis: Secondary | ICD-10-CM

## 2023-10-17 MED ORDER — AIMOVIG 70 MG/ML ~~LOC~~ SOAJ
1.0000 mL | SUBCUTANEOUS | 3 refills | Status: DC
  Filled 2023-10-17 – 2023-12-18 (×9): qty 1, 30d supply, fill #0

## 2023-10-17 NOTE — Progress Notes (Signed)
 [facility] [address] [place] [address] Dept: [phone] Dept Fax: [phone]    Subjective:   Deborah Mcbride 1986-06-05 10/17/2023  Chief Complaint  Patient presents with   Follow-up    HPI: Deborah Mcbride presents today for re-assessment and management of chronic medical conditions.  Discussed the use of AI scribe software for clinical note transcription with the patient, who gave verbal consent to proceed.  History of Present Illness   The patient, with a history of reflux, vitamin D deficiency, iron deficiency, and migraines, presents for a follow-up visit. She has been compliant with her iron and vitamin D supplements. She reports that her reflux has improved since increasing the dose of Zepbound, which she initially feared would worsen her symptoms.  Regarding her migraines, the patient has been taking Bernita Raisin, which has helped stop migraines from daily to two to three times a week. She previously found relief with Aimovig injections, which she self-administered monthly. She has tried Topamax and Amitriptyline in the past with no success.  She is considering resuming Aimovig injections to further reduce the frequency of her migraines.  The patient also reports significant stress related to her job, which she believes is contributing to her health issues. She has considered quitting her job due to the high stress levels. She also reports feeling isolated and overwhelmed, leading to questions about possible depression.       The following portions of the patient's history were reviewed and updated as appropriate: past medical history, past surgical history, family history, social history, allergies, medications, and problem list.   Patient Active Problem List   Diagnosis Date Noted   Elevated LDL cholesterol level 10/12/2023   Precordial pain 10/11/2023   Chronic migraine without aura with status  migrainosus, not intractable 09/05/2023   Iron deficiency anemia 08/01/2023   Insulin resistance 07/31/2023   Hypertriglyceridemia 07/31/2023   Vitamin D deficiency 07/31/2023   Hepatosplenomegaly 04/10/2023   Nasal congestion 01/19/2023   Sinus tachycardia 12/07/2022   OSA (obstructive sleep apnea) 10/21/2022   BMI 50.0-59.9, adult (HCC) 08/03/2022   Morbid obesity (HCC) 07/07/2022   OSA treated with BiPAP 07/07/2022   Gastroesophageal reflux disease without esophagitis 07/07/2022   Past Medical History:  Diagnosis Date   Anemia    Back pain    GERD (gastroesophageal reflux disease)    Joint pain    Obesity    Sleep apnea    Vitamin D deficiency    Past Surgical History:  Procedure Laterality Date   CESAREAN SECTION     CHOLECYSTECTOMY     TONSILLECTOMY     TUBAL LIGATION     Family History  Problem Relation Age of Onset   Diabetes Father    High blood pressure Father    Sleep apnea Father    Obesity Father     Current Outpatient Medications:    acetaminophen (TYLENOL) 500 MG tablet, Take 2 tablets (1,000 mg total) by mouth every 6 (six) hours as needed for mild pain (pain score 1-3) or moderate pain (pain score 4-6)., Disp: 100 tablet, Rfl: 0   Erenumab-aooe (AIMOVIG) 70 MG/ML SOAJ, Inject 1 mL into the skin every 30 (thirty) days., Disp: 1 mL, Rfl: 3   famotidine (PEPCID) 40 MG tablet, Take 1 tablet (40 mg total) by mouth daily., Disp: 90 tablet, Rfl: 1   Iron, Ferrous Sulfate, 325 (65 Fe) MG TABS, Take 1 tablet (325mg ) by mouth daily., Disp: 100 tablet, Rfl:  1   omeprazole (PRILOSEC) 40 MG capsule, Take 1 capsule (40 mg total) by mouth 2 (two) times daily., Disp: 60 capsule, Rfl: 1   ondansetron (ZOFRAN) 4 MG tablet, Take 1 tablet (4 mg total) by mouth every 8 (eight) hours as needed for nausea or vomiting., Disp: 20 tablet, Rfl: 0   Ubrogepant (UBRELVY) 100 MG TABS, Take 1 tablet by mouth at onset of migraine. May repeat dose in 2 hours if migraine persists. Max  daily dose 2 tablets/day., Disp: 16 tablet, Rfl: 2   Vitamin D, Ergocalciferol, (DRISDOL) 1.25 MG (50000 UNIT) CAPS capsule, Take 1 capsule (50,000 Units total) by mouth every 7 (seven) days., Disp: 5 capsule, Rfl: 0   famotidine (PEPCID) 20 MG tablet, Take 1 tablet (20 mg total) by mouth as needed for heartburn or indigestion., Disp: 90 tablet, Rfl: 1   ibuprofen (ADVIL) 800 MG tablet, Take 1 tablet (800 mg total) by mouth every 8 (eight) hours as needed for mild pain (pain score 1-3) or moderate pain (pain score 4-6)., Disp: 30 tablet, Rfl: 0   tirzepatide (ZEPBOUND) 5 MG/0.5ML Pen, Inject 5 mg into the skin once a week., Disp: 2 mL, Rfl: 0 Allergies  Allergen Reactions   Fentanyl Shortness Of Breath, Itching and Nausea And Vomiting    rash   Hydromorphone Itching   Dilaudid [Hydromorphone Hcl] Itching     ROS: A complete ROS was performed with pertinent positives/negatives noted in the HPI. The remainder of the ROS are negative.    Objective:   Today's Vitals   10/17/23 1524  BP: 106/68  Pulse: 75  Temp: 98.1 F (36.7 C)  TempSrc: Temporal  SpO2: 98%  Weight: (!) 322 lb 9.6 oz (146.3 kg)  Height: 5\' 3"  (1.6 m)    GENERAL: Well-appearing, in NAD. Well nourished.  SKIN: Pink, warm and dry.  RESPIRATORY: Chest wall symmetrical. Respirations even and non-labored.  NEUROLOGIC: Steady, even gait.  PSYCH/MENTAL STATUS: Alert, oriented x 3. Cooperative, appropriate mood and affect.   Health Maintenance Due  Topic Date Due   HIV Screening  Never done   Hepatitis C Screening  Never done   HPV VACCINES (2 - 3-dose series) 02/16/2006   COVID-19 Vaccine (1 - 2024-25 season) Never done    No results found for any visits on 10/17/23.  The ASCVD Risk score (Arnett DK, et al., 2019) failed to calculate for the following reasons:   The 2019 ASCVD risk score is only valid for ages 44 to 48     Assessment & Plan:  Assessment and Plan    Migraine Previously used Aimovig with  significant reduction in frequency. Currently using Ubrelvy for acute relief. Goal to reduce frequency with Aimovig. - Prescribe Aimovig and send prescription to [facility]. - Continue Ubrelvy for acute migraine relief. - Monitor migraine frequency and effectiveness of Aimovig.  Gastroesophageal Reflux Disease (GERD) Improvement in symptoms with increased Zepbound dose. Aware of trigger foods and potential side effects. - Continue current Zepbound dosage. - Advise avoidance of trigger foods. - Reassess GERD symptoms and management.  Iron Deficiency Previous levels on lower end of normal. Current levels need assessment. - Order blood test to check iron levels. - Follow up with blood test results for iron levels.  Vitamin D Deficiency Previous levels very low. Current levels need assessment. - Order blood test to check vitamin D levels. - Follow up with blood test results for vitamin D levels.  Stress Management Significant work-related stress impacting mental  health and well-being. - Discuss potential alternative employment options to reduce stress.       Orders Placed This Encounter  Procedures   VITAMIN D 25 Hydroxy (Vit-D Deficiency, Fractures)   CBC with Differential/Platelet   Iron, TIBC and Ferritin Panel   No images are attached to the encounter or orders placed in the encounter. Meds ordered this encounter  Medications   Erenumab-aooe (AIMOVIG) 70 MG/ML SOAJ    Sig: Inject 1 mL into the skin every 30 (thirty) days.    Dispense:  1 mL    Refill:  3    Supervising Provider:   Garnette Gunner [[id number]]    Return in about 6 months (around 04/18/2024) for Annual Physical Exam with fasting lab work.   Salvatore Decent, FNP

## 2023-10-18 ENCOUNTER — Other Ambulatory Visit (HOSPITAL_COMMUNITY): Payer: Self-pay

## 2023-10-18 ENCOUNTER — Encounter (HOSPITAL_COMMUNITY): Payer: Self-pay

## 2023-10-18 LAB — CBC WITH DIFFERENTIAL/PLATELET
Basophils Absolute: 0.1 10*3/uL (ref 0.0–0.1)
Basophils Relative: 1.2 % (ref 0.0–3.0)
Eosinophils Absolute: 0.1 10*3/uL (ref 0.0–0.7)
Eosinophils Relative: 1.4 % (ref 0.0–5.0)
HCT: 39.8 % (ref 36.0–46.0)
Hemoglobin: 13.1 g/dL (ref 12.0–15.0)
Lymphocytes Relative: 23.9 % (ref 12.0–46.0)
Lymphs Abs: 2.1 10*3/uL (ref 0.7–4.0)
MCHC: 33 g/dL (ref 30.0–36.0)
MCV: 81.1 fl (ref 78.0–100.0)
Monocytes Absolute: 0.5 10*3/uL (ref 0.1–1.0)
Monocytes Relative: 6.1 % (ref 3.0–12.0)
Neutro Abs: 6 10*3/uL (ref 1.4–7.7)
Neutrophils Relative %: 67.4 % (ref 43.0–77.0)
Platelets: 338 10*3/uL (ref 150.0–400.0)
RBC: 4.91 Mil/uL (ref 3.87–5.11)
RDW: 15.3 % (ref 11.5–15.5)
WBC: 8.9 10*3/uL (ref 4.0–10.5)

## 2023-10-18 LAB — IRON,TIBC AND FERRITIN PANEL
%SAT: 13 % — ABNORMAL LOW (ref 16–45)
Ferritin: 22 ng/mL (ref 16–154)
Iron: 47 ug/dL (ref 40–190)
TIBC: 354 ug/dL (ref 250–450)

## 2023-10-18 LAB — VITAMIN D 25 HYDROXY (VIT D DEFICIENCY, FRACTURES): VITD: 16.35 ng/mL — ABNORMAL LOW (ref 30.00–100.00)

## 2023-10-19 ENCOUNTER — Encounter: Payer: Self-pay | Admitting: Internal Medicine

## 2023-10-19 ENCOUNTER — Other Ambulatory Visit: Payer: Self-pay | Admitting: Internal Medicine

## 2023-10-19 ENCOUNTER — Other Ambulatory Visit (HOSPITAL_COMMUNITY): Payer: Self-pay

## 2023-10-19 DIAGNOSIS — G4733 Obstructive sleep apnea (adult) (pediatric): Secondary | ICD-10-CM

## 2023-10-19 DIAGNOSIS — E559 Vitamin D deficiency, unspecified: Secondary | ICD-10-CM

## 2023-10-19 MED ORDER — VITAMIN D (ERGOCALCIFEROL) 1.25 MG (50000 UNIT) PO CAPS
50000.0000 [IU] | ORAL_CAPSULE | ORAL | 1 refills | Status: DC
  Filled 2023-10-19: qty 12, 84d supply, fill #0

## 2023-10-19 MED ORDER — VITAMIN D (ERGOCALCIFEROL) 1.25 MG (50000 UNIT) PO CAPS
50000.0000 [IU] | ORAL_CAPSULE | ORAL | 1 refills | Status: DC
  Filled 2023-10-19 – 2023-11-04 (×2): qty 12, 84d supply, fill #0
  Filled 2023-11-21 – 2024-01-22 (×4): qty 12, 84d supply, fill #1

## 2023-10-21 DIAGNOSIS — G4733 Obstructive sleep apnea (adult) (pediatric): Secondary | ICD-10-CM | POA: Diagnosis not present

## 2023-10-23 DIAGNOSIS — G4733 Obstructive sleep apnea (adult) (pediatric): Secondary | ICD-10-CM | POA: Diagnosis not present

## 2023-10-25 ENCOUNTER — Other Ambulatory Visit (HOSPITAL_COMMUNITY): Payer: Self-pay

## 2023-10-25 MED ORDER — TIRZEPATIDE-WEIGHT MANAGEMENT 7.5 MG/0.5ML ~~LOC~~ SOAJ
7.5000 mg | SUBCUTANEOUS | 2 refills | Status: DC
Start: 2023-10-25 — End: 2024-01-02
  Filled 2023-10-25: qty 2, 28d supply, fill #0
  Filled 2023-11-21: qty 2, 28d supply, fill #1
  Filled 2023-12-21: qty 2, 28d supply, fill #2

## 2023-10-26 ENCOUNTER — Other Ambulatory Visit (HOSPITAL_COMMUNITY): Payer: Self-pay

## 2023-10-30 ENCOUNTER — Other Ambulatory Visit (HOSPITAL_COMMUNITY): Payer: Self-pay

## 2023-10-30 ENCOUNTER — Other Ambulatory Visit: Payer: Self-pay | Admitting: Internal Medicine

## 2023-10-30 ENCOUNTER — Encounter (HOSPITAL_COMMUNITY): Payer: Self-pay

## 2023-10-30 ENCOUNTER — Other Ambulatory Visit: Payer: Self-pay | Admitting: Bariatrics

## 2023-10-30 ENCOUNTER — Other Ambulatory Visit: Payer: Self-pay

## 2023-10-30 DIAGNOSIS — E66813 Obesity, class 3: Secondary | ICD-10-CM

## 2023-10-30 DIAGNOSIS — E781 Pure hyperglyceridemia: Secondary | ICD-10-CM

## 2023-10-30 DIAGNOSIS — R632 Polyphagia: Secondary | ICD-10-CM

## 2023-10-30 DIAGNOSIS — E88819 Insulin resistance, unspecified: Secondary | ICD-10-CM

## 2023-10-30 MED ORDER — ACETAMINOPHEN 500 MG PO TABS
1000.0000 mg | ORAL_TABLET | Freq: Four times a day (QID) | ORAL | 0 refills | Status: DC | PRN
  Filled 2023-10-30: qty 100, 13d supply, fill #0

## 2023-10-30 MED ORDER — IBUPROFEN 800 MG PO TABS
800.0000 mg | ORAL_TABLET | Freq: Three times a day (TID) | ORAL | 0 refills | Status: DC | PRN
  Filled 2023-10-30: qty 30, 10d supply, fill #0

## 2023-10-31 ENCOUNTER — Other Ambulatory Visit (HOSPITAL_COMMUNITY): Payer: Self-pay

## 2023-10-31 DIAGNOSIS — Z0279 Encounter for issue of other medical certificate: Secondary | ICD-10-CM

## 2023-10-31 NOTE — Telephone Encounter (Signed)
Paperwork completed. Please fax.

## 2023-11-02 ENCOUNTER — Other Ambulatory Visit (HOSPITAL_COMMUNITY): Payer: Self-pay

## 2023-11-02 ENCOUNTER — Telehealth: Payer: Self-pay

## 2023-11-02 NOTE — Telephone Encounter (Signed)
 Pharmacy Patient Advocate Encounter   Received notification from CoverMyMeds that prior authorization for Ubrelvy 100MG  tablets is required/requested.   Insurance verification completed.   The patient is insured through [facility] .   Per test claim: PA required; PA submitted to above mentioned insurance via CoverMyMeds Key/confirmation #/EOC [id number] Status is pending

## 2023-11-03 ENCOUNTER — Telehealth: Payer: Self-pay

## 2023-11-03 ENCOUNTER — Other Ambulatory Visit (HOSPITAL_COMMUNITY): Payer: Self-pay

## 2023-11-03 NOTE — Telephone Encounter (Signed)
 Pharmacy Patient Advocate Encounter   Received notification from CoverMyMeds that prior authorization for Aimovig 70MG /ML auto-injectors is required/requested.   Insurance verification completed.   The patient is insured through [facility] [place] .   Per test claim: PA required; PA submitted to above mentioned insurance via CoverMyMeds Key/confirmation #/EOC  [id number] Status is pending

## 2023-11-06 ENCOUNTER — Other Ambulatory Visit (HOSPITAL_COMMUNITY): Payer: Self-pay

## 2023-11-06 ENCOUNTER — Other Ambulatory Visit: Payer: Self-pay

## 2023-11-06 NOTE — Telephone Encounter (Signed)
 Pharmacy Patient Advocate Encounter  Received notification from [facility] Medicaid that Prior Authorization for Aimovig 70MG /ML auto-injectors has been DENIED.  See denial reason below. No denial letter attached in CMM. Will attach denial letter to Media tab once received.   PA #/Case ID/Reference #:  [id number]

## 2023-11-07 ENCOUNTER — Telehealth: Payer: Self-pay | Admitting: Internal Medicine

## 2023-11-07 ENCOUNTER — Other Ambulatory Visit (HOSPITAL_COMMUNITY): Payer: Self-pay

## 2023-11-07 ENCOUNTER — Telehealth: Payer: Self-pay | Admitting: Pharmacist

## 2023-11-07 DIAGNOSIS — M545 Low back pain, unspecified: Secondary | ICD-10-CM | POA: Diagnosis not present

## 2023-11-07 DIAGNOSIS — R309 Painful micturition, unspecified: Secondary | ICD-10-CM | POA: Diagnosis not present

## 2023-11-07 DIAGNOSIS — R109 Unspecified abdominal pain: Secondary | ICD-10-CM | POA: Diagnosis not present

## 2023-11-07 NOTE — Telephone Encounter (Addendum)
 Patient called asking to addend FMLA forms due to recurrent UTI

## 2023-11-07 NOTE — Telephone Encounter (Signed)
 Pharmacy Patient Advocate Encounter  Received notification from [facility] that Prior Authorization for Ubrelvy 100mg  has been APPROVED from 11/02/23 to 11/01/24   PA #/Case ID/Reference #: [id number]  Approval letter indexed to media tab

## 2023-11-07 NOTE — Telephone Encounter (Signed)
 Can we appeal Aimovig 70MG /ML auto-injectors 10/17/23 office visit supports the need for Rx

## 2023-11-07 NOTE — Telephone Encounter (Signed)
 Received a fax from [facility] [place] for additional clinical information  Faxed clinical info to [phone]  Case Key# [id number]  Phone# [phone]

## 2023-11-07 NOTE — Telephone Encounter (Signed)
 Copied from CRM [id number]. Topic: General - Call Back - No Documentation >> Nov 07, 2023  2:39 PM Gurney Maxin H wrote: Reason for CRM: Patient states she's retuning a call to [place] no messages or notes in system  [place] [phone]

## 2023-11-07 NOTE — Telephone Encounter (Signed)
 Appeal request asks for information that requires your assistance. Please see the question below and advise accordingly. The appeal will not be submitted until the necessary information is received.   04

## 2023-11-12 ENCOUNTER — Emergency Department (HOSPITAL_BASED_OUTPATIENT_CLINIC_OR_DEPARTMENT_OTHER)
Admission: EM | Admit: 2023-11-12 | Discharge: 2023-11-12 | Disposition: A | Discharge status: Home / Self Care | Attending: Emergency Medicine | Admitting: Emergency Medicine

## 2023-11-12 ENCOUNTER — Emergency Department (HOSPITAL_BASED_OUTPATIENT_CLINIC_OR_DEPARTMENT_OTHER)

## 2023-11-12 ENCOUNTER — Encounter (HOSPITAL_BASED_OUTPATIENT_CLINIC_OR_DEPARTMENT_OTHER): Payer: Self-pay

## 2023-11-12 ENCOUNTER — Other Ambulatory Visit: Payer: Self-pay

## 2023-11-12 DIAGNOSIS — E876 Hypokalemia: Secondary | ICD-10-CM | POA: Diagnosis not present

## 2023-11-12 DIAGNOSIS — R1084 Generalized abdominal pain: Secondary | ICD-10-CM | POA: Diagnosis not present

## 2023-11-12 DIAGNOSIS — R1013 Epigastric pain: Secondary | ICD-10-CM | POA: Diagnosis not present

## 2023-11-12 DIAGNOSIS — Z9049 Acquired absence of other specified parts of digestive tract: Secondary | ICD-10-CM | POA: Diagnosis not present

## 2023-11-12 DIAGNOSIS — R109 Unspecified abdominal pain: Secondary | ICD-10-CM | POA: Diagnosis not present

## 2023-11-12 DIAGNOSIS — M545 Low back pain, unspecified: Secondary | ICD-10-CM | POA: Diagnosis not present

## 2023-11-12 DIAGNOSIS — R160 Hepatomegaly, not elsewhere classified: Secondary | ICD-10-CM | POA: Diagnosis not present

## 2023-11-12 DIAGNOSIS — R1011 Right upper quadrant pain: Secondary | ICD-10-CM | POA: Insufficient documentation

## 2023-11-12 DIAGNOSIS — R1012 Left upper quadrant pain: Secondary | ICD-10-CM | POA: Diagnosis not present

## 2023-11-12 LAB — COMPREHENSIVE METABOLIC PANEL WITH GFR
ALT: 17 U/L (ref 0–44)
AST: 22 U/L (ref 15–41)
Albumin: 3.6 g/dL (ref 3.5–5.0)
Alkaline Phosphatase: 63 U/L (ref 38–126)
Anion gap: 9 (ref 5–15)
BUN: 11 mg/dL (ref 6–20)
CO2: 24 mmol/L (ref 22–32)
Calcium: 9 mg/dL (ref 8.9–10.3)
Chloride: 105 mmol/L (ref 98–111)
Creatinine, Ser: 0.79 mg/dL (ref 0.44–1.00)
GFR, Estimated: >60 mL/min (ref >60)
Glucose, Bld: 100 mg/dL — ABNORMAL HIGH (ref 70–99)
Potassium: 3.4 mmol/L — ABNORMAL LOW (ref 3.5–5.1)
Sodium: 138 mmol/L (ref 135–145)
Total Bilirubin: 0.5 mg/dL (ref 0.0–1.2)
Total Protein: 7 g/dL (ref 6.5–8.1)

## 2023-11-12 LAB — CBC WITH DIFFERENTIAL/PLATELET
Abs Immature Granulocytes: 0.1 10*3/uL — ABNORMAL HIGH (ref 0.00–0.07)
Basophils Absolute: 0.1 10*3/uL (ref 0.0–0.1)
Basophils Relative: 1 %
Eosinophils Absolute: 0.1 10*3/uL (ref 0.0–0.5)
Eosinophils Relative: 1 %
HCT: 37.5 % (ref 36.0–46.0)
Hemoglobin: 12.5 g/dL (ref 12.0–15.0)
Immature Granulocytes: 1 %
Lymphocytes Relative: 24 %
Lymphs Abs: 2.5 10*3/uL (ref 0.7–4.0)
MCH: 26.5 pg (ref 26.0–34.0)
MCHC: 33.3 g/dL (ref 30.0–36.0)
MCV: 79.4 fL — ABNORMAL LOW (ref 80.0–100.0)
Monocytes Absolute: 0.6 10*3/uL (ref 0.1–1.0)
Monocytes Relative: 6 %
Neutro Abs: 6.7 10*3/uL (ref 1.7–7.7)
Neutrophils Relative %: 67 %
Platelets: 385 10*3/uL (ref 150–400)
RBC: 4.72 MIL/uL (ref 3.87–5.11)
RDW: 14.9 % (ref 11.5–15.5)
WBC: 10.1 10*3/uL (ref 4.0–10.5)
nRBC: 0 % (ref 0.0–0.2)

## 2023-11-12 LAB — URINALYSIS, ROUTINE W REFLEX MICROSCOPIC
Bilirubin Urine: NEGATIVE
Glucose, UA: NEGATIVE mg/dL
Ketones, ur: NEGATIVE mg/dL
Leukocytes,Ua: NEGATIVE
Nitrite: NEGATIVE
Protein, ur: NEGATIVE mg/dL
Specific Gravity, Urine: 1.025 (ref 1.005–1.030)
pH: 5.5 (ref 5.0–8.0)

## 2023-11-12 LAB — URINALYSIS, MICROSCOPIC (REFLEX)

## 2023-11-12 LAB — PREGNANCY, URINE: Preg Test, Ur: NEGATIVE

## 2023-11-12 LAB — LIPASE, BLOOD: Lipase: 21 U/L (ref 11–51)

## 2023-11-12 MED ORDER — IOHEXOL 300 MG/ML  SOLN
100.0000 mL | Freq: Once | INTRAMUSCULAR | Status: AC | PRN
  Administered 2023-11-12: 100 mL via INTRAVENOUS

## 2023-11-12 MED ORDER — SODIUM CHLORIDE 0.9 % IV BOLUS
1000.0000 mL | Freq: Once | INTRAVENOUS | Status: AC
  Administered 2023-11-12: 1000 mL via INTRAVENOUS

## 2023-11-12 MED ORDER — METHOCARBAMOL 500 MG PO TABS
500.0000 mg | ORAL_TABLET | Freq: Two times a day (BID) | ORAL | 0 refills | Status: AC
  Filled 2023-11-12: qty 14, 7d supply, fill #0

## 2023-11-12 MED ORDER — CEPHALEXIN 500 MG PO CAPS
500.0000 mg | ORAL_CAPSULE | Freq: Two times a day (BID) | ORAL | 0 refills | Status: AC
  Filled 2023-11-12: qty 14, 7d supply, fill #0

## 2023-11-12 NOTE — ED Notes (Signed)
 Lab called to notify of add-on urine cx.

## 2023-11-12 NOTE — ED Triage Notes (Signed)
 Pt reports lower back pain as well as bilateral flank pain (worse on right) for past week. Pt reports being on ATB for a bladder infection briefly but then being called and told she does not have an infection and does not need to continue taking the medication. Endorses nausea, mild diarrhea. Denies vomiting/fever. Hx of cholecystectomy.

## 2023-11-12 NOTE — ED Provider Notes (Signed)
 [facility] Provider Note   CSN: [id number] Arrival date & time: 11/12/23  1548     History  Chief Complaint  Patient presents with   Flank Pain         Deborah Mcbride is a 38 y.o. female history of obesity, GERD, sleep apnea, anemia, hypertriglyceridemia, cholecystectomy.  Low back/flank pain and abdominal pain.  Patient reports that she has had chronic low back pain for several years.  She reports that she has seen a back specialist in the past who told her there is "nothing to do about it".  She reports her back pain is constant and worsens with motion, she points to the bilateral low back as her area of pain.  She does report occasionally pain will radiate to her hips and associate with a tingling sensation of her left hip but that is largely unchanged.  She reports her low back pain exacerbated by sitting at her desk for long periods of time, she works from home.  Patient reports that over the past week she has noticed some more back pain than normal and has also been experiencing abdominal pain.  Pain is diffuse to the abdomen without any specific area of maximal tenderness.  She reports occasionally she noticed the pain more on the right side than compared to the left.  Pain is sharp and aching.  She cannot recall any clear inciting event.  Patient cannot identify whether pain is coming from her back or coming from her abdomen.  Patient reports that she went to an urgent care earlier in the week and was told she may have a urinary tract infection.  She reports that she was started on antibiotic that she took for 1 day before being called and told to discontinue the antibiotic.  She reports symptoms have been constant and slightly worsened since that time.  She denies similar issues in the past.    She denies any fall or injury, numbness, tingling, saddle paresthesias, incontinence, fever, nausea, vomiting, dysuria/hematuria, chance of  pregnancy, pelvic pain, chance of STI, abnormal vaginal discharge or any additional concerns.  Of note patient reports chronic loose stools from her cholecystectomy but that is unchanged.  HPI     Home Medications Prior to Admission medications   Medication Sig Start Date End Date Taking? Authorizing Provider  cephALEXin (KEFLEX) 500 MG capsule Take 1 capsule (500 mg total) by mouth 2 (two) times daily for 7 days. 11/12/23 11/19/23 Yes Hadassah Letters A, PA-C  methocarbamol (ROBAXIN) 500 MG tablet Take 1 tablet (500 mg total) by mouth 2 (two) times daily for 7 days. 11/12/23 11/19/23 Yes Hadassah Letters A, PA-C  acetaminophen (TYLENOL) 500 MG tablet Take 2 tablets (1,000 mg total) by mouth every 6 (six) hours as needed for mild pain (pain score 1-3) or moderate pain (pain score 4-6). 10/30/23   Gavin Kast, FNP  Erenumab-aooe (AIMOVIG) 70 MG/ML SOAJ Inject 1 mL into the skin every 30 (thirty) days. 10/17/23   Gavin Kast, FNP  famotidine (PEPCID) 20 MG tablet Take 1 tablet (20 mg total) by mouth as needed for heartburn or indigestion. 08/11/23   Gavin Kast, FNP  famotidine (PEPCID) 40 MG tablet Take 1 tablet (40 mg total) by mouth daily. 07/18/23     ibuprofen (ADVIL) 800 MG tablet Take 1 tablet (800 mg total) by mouth every 8 (eight) hours as needed for mild pain (pain score 1-3) or moderate pain (pain score 4-6). 10/30/23  Gavin Kast, FNP  Iron, Ferrous Sulfate, 325 (65 Fe) MG TABS Take 1 tablet (325mg ) by mouth daily. 08/22/23   Gavin Kast, FNP  omeprazole (PRILOSEC) 40 MG capsule Take 1 capsule (40 mg total) by mouth 2 (two) times daily. 09/06/23   Gavin Kast, FNP  ondansetron (ZOFRAN) 4 MG tablet Take 1 tablet (4 mg total) by mouth every 8 (eight) hours as needed for nausea or vomiting. 08/10/23   Kirk Peper A, DO  tirzepatide (ZEPBOUND) 7.5 MG/0.5ML Pen Inject 7.5 mg into the skin once a week. 10/25/23   Gavin Kast, FNP  Ubrogepant (UBRELVY) 100 MG TABS Take 1 tablet by mouth at  onset of migraine. May repeat dose in 2 hours if migraine persists. Max daily dose 2 tablets/day. 09/05/23   Gavin Kast, FNP  Vitamin D, Ergocalciferol, (DRISDOL) 1.25 MG (50000 UNIT) CAPS capsule Take 1 capsule (50,000 Units total) by mouth every 7 (seven) days. 10/19/23   Gavin Kast, FNP      Allergies    Fentanyl, Hydromorphone, and Dilaudid [hydromorphone hcl]    Review of Systems   Review of Systems Ten systems are reviewed and are negative for acute change except as noted in the HPI  Physical Exam Updated Vital Signs BP 119/66 (BP Location: Left Arm)   Pulse (!) 59   Temp 98.1 F (36.7 C) (Oral)   Resp 20   Ht 5\' 3"  (1.6 m)   Wt (!) 143.3 kg   SpO2 100%   BMI 55.98 kg/m  Physical Exam Constitutional:      General: She is not in acute distress.    Appearance: Normal appearance. She is well-developed. She is obese. She is not ill-appearing or diaphoretic.  HENT:     Head: Normocephalic and atraumatic.  Eyes:     General: Vision grossly intact. Gaze aligned appropriately.     Pupils: Pupils are equal, round, and reactive to light.  Neck:     Trachea: Trachea and phonation normal.  Cardiovascular:     Rate and Rhythm: Normal rate and regular rhythm.     Pulses: Normal pulses.  Pulmonary:     Effort: Pulmonary effort is normal. No respiratory distress.     Breath sounds: Normal breath sounds.  Abdominal:     General: There is no distension.     Palpations: Abdomen is soft.     Tenderness: There is generalized abdominal tenderness and tenderness in the right upper quadrant, epigastric area and left upper quadrant. There is no guarding or rebound. Negative signs include McBurney's sign.  Genitourinary:    Comments: Patient declined pelvic exam today.   Musculoskeletal:        General: Normal range of motion.     Cervical back: Normal range of motion.     Comments:   Low back: Diffuse tenderness of the low back without point tenderness.  Tenderness is mostly over  the right paralumbar musculature.  No overlying skin changes of the low back, no evidence of trauma.  No rashes or lesions.  No crepitus step-off or deformity of the spine.  Patient reports increased pain with forward flexion and extension of the lumbar spine.  Patient reports increased pain with both bilateral rotation and with bilateral bends.  Negative straight leg raise bilaterally.  Sensation intact and equal in all distributions of the bilateral lower extremities.  Patient has 5/5 strength with bilateral EHL, dorsi/plantarflexion, knee flexion/extension and hip flexion.  Negative clonus test bilaterally.  Strong and equal reflexes bilateral Achilles  and patellar.  Strong equal pedal pulses.  Capillary refill and sensation intact to all toes.  Compartments soft.  No significant swelling of either leg.  Skin:    General: Skin is warm and dry.  Neurological:     Mental Status: She is alert.     GCS: GCS eye subscore is 4. GCS verbal subscore is 5. GCS motor subscore is 6.     Comments: Speech is clear and goal oriented, follows commands Major Cranial nerves without deficit, no facial droop Moves extremities without ataxia, coordination intact Steady unassisted gait  Psychiatric:        Behavior: Behavior normal.     ED Results / Procedures / Treatments   Labs (all labs ordered are listed, but only abnormal results are displayed) Labs Reviewed  CBC WITH DIFFERENTIAL/PLATELET - Abnormal; Notable for the following components:      Result Value   MCV 79.4 (*)    Abs Immature Granulocytes 0.10 (*)    All other components within normal limits  COMPREHENSIVE METABOLIC PANEL WITH GFR - Abnormal; Notable for the following components:   Potassium 3.4 (*)    Glucose, Bld 100 (*)    All other components within normal limits  URINALYSIS, ROUTINE W REFLEX MICROSCOPIC - Abnormal; Notable for the following components:   Hgb urine dipstick LARGE (*)    All other components within normal limits   URINALYSIS, MICROSCOPIC (REFLEX) - Abnormal; Notable for the following components:   Bacteria, UA MANY (*)    Non Squamous Epithelial PRESENT (*)    All other components within normal limits  URINE CULTURE  LIPASE, BLOOD  PREGNANCY, URINE    EKG None  Radiology CT ABDOMEN PELVIS W CONTRAST Result Date: 11/12/2023 CLINICAL DATA:  Abdominal pain, acute, nonlocalized. Bilateral flank pain and lower back pain. EXAM: CT ABDOMEN AND PELVIS WITH CONTRAST TECHNIQUE: Multidetector CT imaging of the abdomen and pelvis was performed using the standard protocol following bolus administration of intravenous contrast. RADIATION DOSE REDUCTION: This exam was performed according to the departmental dose-optimization program which includes automated exposure control, adjustment of the mA and/or kV according to patient size and/or use of iterative reconstruction technique. CONTRAST:  100mL OMNIPAQUE IOHEXOL 300 MG/ML  SOLN COMPARISON:  08/08/2022. FINDINGS: Lower chest: No acute abnormality. Hepatobiliary: No focal liver abnormality is seen. The liver is mildly enlarged. Status post cholecystectomy. No biliary dilatation. Pancreas: Unremarkable. No pancreatic ductal dilatation or surrounding inflammatory changes. Spleen: Normal in size without focal abnormality. Adrenals/Urinary Tract: The adrenal glands are within normal limits. The kidneys enhance symmetrically. No renal or ureteral calculus or obstructive uropathy bilaterally. The bladder is unremarkable. Stomach/Bowel: Stomach is within normal limits. Appendix appears normal. No evidence of bowel wall thickening, distention, or inflammatory changes. No free air or pneumatosis. Vascular/Lymphatic: No significant vascular findings are present. No enlarged abdominal or pelvic lymph nodes. Reproductive: Uterus and bilateral adnexa are unremarkable. Other: No abdominopelvic ascites. Small fat containing umbilical hernia is present. Musculoskeletal: Mild degenerative  changes are noted in the thoracic spine. There are pars defects at L5 with no spondylolisthesis. IMPRESSION: 1. No renal or ureteral calculus or obstructive uropathy bilaterally. No evidence of pyelonephritis. 2. 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 3. Mild degenerative changes in the thoracolumbar spine. Electronically Signed   By: Wyvonnia Heimlich M.D.   On: 11/12/2023 19:03    Procedures Procedures    Medications Ordered in ED Medications  sodium chloride 0.9 % bolus 1,000 mL (0 mLs Intravenous Stopped  11/12/23 1957)  iohexol (OMNIPAQUE) 300 MG/ML solution 100 mL (100 mLs Intravenous Contrast Given 11/12/23 1742)    ED Course/ Medical Decision Making/ A&P Clinical Course as of 11/12/23 2001  Sun Nov 12, 2023  1729 CBC with Differential(!) CBC without leukocytosis, anemia or thrombocytopenia. [BM]  1730 Pregnancy, urine Pregnancy test negative [BM]  1730 Comprehensive metabolic panel(!) CMP with mild hypokalemia of 3.4.  No emergent electrolyte derangement, AKI, LFT elevations or gap. [BM]  1730 Urinalysis, Routine w reflex microscopic -Urine, Clean Catch(!) Urinalysis shows large hemoglobin, likely due to patient's menstrual cycle.  No leukocytes or nitrites to suggest infection.  No ketones to suggest dehydration or DKA. [BM]  1730 Urinalysis, Microscopic (reflex)(!) UA shows 6-10 WBCs, many bacteria, non-squamous cells and RBCs 11-20.  Questionable for UTI.  Will send for culture and start patient on Keflex. [BM]  1731 Lipase, blood Lipase within normal limits, doubt pancreatitis [BM]  1915 CT ABDOMEN PELVIS W CONTRAST Radiology reports CT abdomen pelvis without renal ureteral calculi, no pyelonephritis.  Radiologist notes bilateral pars defect at L5 with no spondylolisthesis.  Mild degenerative changes of the thoracolumbar spine.  I reviewed patient's CT scan and agree with radiologist interpretation. [BM]    Clinical Course User Index [BM] Jennifer Moellers, PA-C                                  Medical Decision Making 38 year old female presents for 1 week of abdominal pain and lower back pain.  No fall or injury.  Differential for patient's chief complaint includes but not limited to SBO, appendicitis, diverticulitis, gastritis, pyelonephritis, kidney stone disease, PID, UTI.  Plan to obtain CBC, CMP, lipase, pregnancy test, urinalysis, CT abdomen pelvis, provide IV fluids and monitor patient.  Physical exam significant for bilateral paralumbar muscular tenderness along with bilateral abdominal tenderness mostly in the upper abdomen.  No significant tenderness to the lower abdomen.  Patient denies vaginal discharge or concern for STI.  Patient declined pelvic examination.  Overall lower suspicion for PID or ovarian torsion.    Amount and/or Complexity of Data Reviewed Labs: ordered. Decision-making details documented in ED Course. Radiology: ordered. Decision-making details documented in ED Course.  Risk Prescription drug management. Risk Details: Patient reassessed she is resting comfortably and in no acute distress.  Patient has been ambulatory in the emergency department without assistance.  Vital signs are stable on room air.  Patient reports that she is feeling well on reassessment.  We discussed laboratory and imaging findings at length today.  Considering urinalysis with many bacteria that is thought to be prudent to start patient on a course of Keflex for possible UTI as the cause of her abdominal pain.  Urine culture is pending.  We again discussed other etiologies of her symptoms, patient denies any concern for STI or vaginal discharge.  Patient again declined pelvic examination today.  Suspect patient may be experiencing UTI as cause of her symptoms today.  She also has chronic low back pain which could be contributing to her presentation today.  Radiographs show degenerative changes of the thoracolumbar spine along with bilateral pars defect  without spondylolisthesis.  We discussed LSO back brace today, patient will obtain 1 OTC.  Will start patient on methocarbamol 500 mg twice daily to help with musculoskeletal etiology of her symptoms.  We discussed strict muscle relaxer precautions today and patient stated understanding.  I asked patient to call her  primary care provider to schedule an appointment for a recheck sometime in the next week.  We discussed signs/symptoms to necessitate emergency department evaluation.  Case discussed with attending physician Dr. Tamela Fake who agrees with Keflex and discharged with outpatient follow-up.  Low suspicion for cauda equina, AAA, dissection, spinal epidural abscess, myelopathy, SBO, appendicitis, torsion, PID or other emergent pathologies at this time.  Strict ER precautions given.      At this time there does not appear to be any evidence of an acute emergency medical condition and the patient appears stable for discharge with appropriate outpatient follow up. Diagnosis was discussed with patient who verbalizes understanding of care plan and is agreeable to discharge. I have discussed return precautions with patient who verbalizes understanding. Patient encouraged to follow-up with their PCP. All questions answered.     Note: Portions of this report may have been transcribed using voice recognition software. Every effort was made to ensure accuracy; however, inadvertent computerized transcription errors may still be present.         Final Clinical Impression(s) / ED Diagnoses Final diagnoses:  Acute bilateral low back pain without sciatica  Generalized abdominal pain    Rx / DC Orders ED Discharge Orders          Ordered    cephALEXin (KEFLEX) 500 MG capsule  2 times daily        11/12/23 1935    methocarbamol (ROBAXIN) 500 MG tablet  2 times daily        11/12/23 1944              Deborah Mcbride 11/12/23 Deborah Dickinson, MD 11/14/23 [phone]

## 2023-11-12 NOTE — Discharge Instructions (Addendum)
 At this time there does not appear to be the presence of an emergent medical condition, however there is always the potential for conditions to change. Please read and follow the below instructions.  Please return to the Emergency Department immediately for any new or worsening symptoms or if your symptoms do not improve within 3 days. Please be sure to follow up with your Primary Care Provider within one week regarding your visit today; please call their office to schedule an appointment even if you are feeling better for a follow-up visit. Please take your antibiotic Keflex as prescribed until complete to help with your symptoms.  Please drink enough water to avoid dehydration and get plenty of rest. You may take the muscle relaxer methocarbamol as prescribed to help with muscular spasm.  This medication will make you drowsy so do not drive, drink alcohol or take any other sedating medication with methocarbamol.   Please read the additional information packets attached to your discharge summary.  Go to the nearest Emergency Department immediately if: You have fever or chills You cannot stop vomiting. Your pain is only in one part of your belly, like on the right side. You have bloody or black poop, or poop that looks like tar. You have trouble breathing. You have chest pain. You develop new bowel or bladder control problems. You have unusual weakness or numbness in your arms or legs. You feel faint. You have any new/concerning or worsening of symptoms.  Do not take your medicine if  develop an itchy rash, swelling in your mouth or lips, or difficulty breathing; call 911 and seek immediate emergency medical attention if this occurs.  You may review your lab tests and imaging results in their entirety on your MyChart account.  Please discuss all results of fully with your primary care provider and other specialist at your follow-up visit.  Note: Portions of this text may have been  transcribed using voice recognition software. Every effort was made to ensure accuracy; however, inadvertent computerized transcription errors may still be present.

## 2023-11-13 ENCOUNTER — Other Ambulatory Visit (HOSPITAL_COMMUNITY): Payer: Self-pay

## 2023-11-13 ENCOUNTER — Telehealth: Payer: Self-pay

## 2023-11-13 LAB — URINE CULTURE: Culture: 20000 — AB

## 2023-11-13 NOTE — Transitions of Care (Post Inpatient/ED Visit) (Signed)
 11/13/2023  Name: Deborah Mcbride MRN: [id number] DOB: 1986-03-07  Today's TOC FU Call Status: Today's TOC FU Call Status:: Successful TOC FU Call Completed TOC FU Call Complete Date: 11/10/23 Patient's Name and Date of Birth confirmed.  Transition Care Management Follow-up Telephone Call Date of Discharge: 11/12/23 Discharge Facility: [facility] Type of Discharge: Emergency Department Reason for ED Visit: Other: (back, abdominal pain and UTI symptoms) How have you been since you were released from the hospital?: Same Any questions or concerns?: Yes Patient Questions/Concerns:: Pt C/O of ongoing back pain and possible UTI Patient Questions/Concerns Addressed: Provided Patient Educational Materials  Items Reviewed: Did you receive and understand the discharge instructions provided?: Yes Medications obtained,verified, and reconciled?: Yes (Medications Reviewed) Any new allergies since your discharge?: No Dietary orders reviewed?: NA Do you have support at home?: No  Medications Reviewed Today: Medications Reviewed Today     Reviewed by Lonie Roa, CMA (Certified Medical Assistant) on 11/13/23 at 1609  Med List Status: <None>   Medication Order Taking? Sig Documenting Provider Last Dose Status Informant  acetaminophen (TYLENOL) 500 MG tablet [id number] Yes Take 2 tablets (1,000 mg total) by mouth every 6 (six) hours as needed for mild pain (pain score 1-3) or moderate pain (pain score 4-6). Gavin Kast, FNP Taking Active   cephALEXin (KEFLEX) 500 MG capsule [id number] Yes Take 1 capsule (500 mg total) by mouth 2 (two) times daily for 7 days. Hadassah Letters A, PA-C Taking Active   Erenumab-aooe (AIMOVIG) 70 MG/ML [place] [id number] Yes Inject 1 mL into the skin every 30 (thirty) days. Gavin Kast, FNP Taking Active   famotidine (PEPCID) 20 MG tablet [id number] Yes Take 1 tablet (20 mg total) by mouth as needed for heartburn or indigestion. Gavin Kast, FNP Taking  Active   famotidine (PEPCID) 40 MG tablet [id number] Yes Take 1 tablet (40 mg total) by mouth daily.  Taking Active   ibuprofen (ADVIL) 800 MG tablet [id number] Yes Take 1 tablet (800 mg total) by mouth every 8 (eight) hours as needed for mild pain (pain score 1-3) or moderate pain (pain score 4-6). Gavin Kast, FNP Taking Active   Iron, Ferrous Sulfate, 325 (65 Fe) MG TABS [id number] Yes Take 1 tablet (325mg ) by mouth daily. Gavin Kast, FNP Taking Active   lidocaine (XYLOCAINE) 2 % solution [id number] Yes TAKE 5 MLS BY MOUTH THREE TIMES DAILY AS NEEDED FOR PAIN. SWISH AND GARGLE. KEEP IN MOUTH FOR 30 SECONDS AND SPIT [provider] Taking Active   methocarbamol (ROBAXIN) 500 MG tablet [id number] Yes Take 1 tablet (500 mg total) by mouth 2 (two) times daily for 7 days. Jennifer Moellers, PA-C Taking Active   nitrofurantoin, macrocrystal-monohydrate, (MACROBID) 100 MG capsule [id number] Yes Take 100 mg by mouth 2 (two) times daily. [provider] Taking Active   omeprazole (PRILOSEC) 40 MG capsule [id number] Yes Take 1 capsule (40 mg total) by mouth 2 (two) times daily. Gavin Kast, FNP Taking Active   ondansetron (ZOFRAN) 4 MG tablet [id number] Yes Take 1 tablet (4 mg total) by mouth every 8 (eight) hours as needed for nausea or vomiting. Kirk Peper A, DO Taking Active   tirzepatide (ZEPBOUND) 7.5 MG/0.5ML Pen [id number] Yes Inject 7.5 mg into the skin once a week. Gavin Kast, FNP Taking Active   Ubrogepant (UBRELVY) 100 MG TABS [id number] Yes Take 1 tablet by mouth at onset of migraine. May repeat dose in 2 hours if migraine persists. Max daily dose 2 tablets/day. Gavin Kast,  FNP Taking Active   Vitamin D, Ergocalciferol, (DRISDOL) 1.25 MG (50000 UNIT) CAPS capsule [id number] Yes Take 1 capsule (50,000 Units total) by mouth every 7 (seven) days. Gavin Kast, FNP Taking Active             Home Care and Equipment/Supplies: Were Home Health Services Ordered?: NA Any new  equipment or medical supplies ordered?: NA  Functional Questionnaire: Do you need assistance with bathing/showering or dressing?: No Do you need assistance with meal preparation?: No Do you need assistance with eating?: No Do you have difficulty maintaining continence: No Do you need assistance with getting out of bed/getting out of a chair/moving?: No Do you have difficulty managing or taking your medications?: No  Follow up appointments reviewed: PCP Follow-up appointment confirmed?: Yes Date of PCP follow-up appointment?: 11/14/23 Follow-up Provider: Gavin Kast NP [facility] Follow-up appointment confirmed?: NA Do you need transportation to your follow-up appointment?: No Do you understand care options if your condition(s) worsen?: Yes-patient verbalized understanding    SIGNATURE Kirby Peoples, RMA

## 2023-11-14 ENCOUNTER — Ambulatory Visit (INDEPENDENT_AMBULATORY_CARE_PROVIDER_SITE_OTHER): Admitting: Internal Medicine

## 2023-11-14 ENCOUNTER — Encounter: Payer: Self-pay | Admitting: Internal Medicine

## 2023-11-14 ENCOUNTER — Telehealth: Payer: Self-pay

## 2023-11-14 VITALS — BP 120/74 | HR 65 | Temp 98.2°F | Ht 63.0 in | Wt 321.4 lb

## 2023-11-14 DIAGNOSIS — N926 Irregular menstruation, unspecified: Secondary | ICD-10-CM | POA: Diagnosis not present

## 2023-11-14 DIAGNOSIS — G8929 Other chronic pain: Secondary | ICD-10-CM | POA: Diagnosis not present

## 2023-11-14 DIAGNOSIS — M545 Low back pain, unspecified: Secondary | ICD-10-CM

## 2023-11-14 DIAGNOSIS — N390 Urinary tract infection, site not specified: Secondary | ICD-10-CM | POA: Diagnosis not present

## 2023-11-14 NOTE — Telephone Encounter (Signed)
 Copied from CRM [id number]. Topic: General - Running Late >> Nov 14, 2023  3:03 PM Alyse July wrote: Patient/patient representative is calling because they are running late for an appointment. Less than 10 mins

## 2023-11-14 NOTE — Progress Notes (Signed)
 ED Antimicrobial Stewardship Positive Culture Follow Up   Deborah Mcbride is an 38 y.o. female who presented to [facility] on 11/12/2023 with a chief complaint of  Chief Complaint  Patient presents with   Flank Pain         Recent Results (from the past 720 hours)  Urine Culture     Status: Abnormal   Collection Time: 11/12/23  4:14 PM   Specimen: Urine, Clean Catch  Result Value Ref Range Status   Specimen Description   Final    URINE, CLEAN CATCH Performed at [facility], [address] [place], [place] [address]    Special Requests   Final    NONE Performed at [facility], [address] [place], [place] [address]    Culture (A)  Final    20,000 COLONIES/mL GROUP B STREP(S.AGALACTIAE)ISOLATED TESTING AGAINST S. AGALACTIAE NOT ROUTINELY PERFORMED DUE TO PREDICTABILITY OF AMP/PEN/VAN SUSCEPTIBILITY. Performed at [facility], [address], [place], [place] [address]    Report Status 11/13/2023 FINAL  Final    [x]  No treatment indicated  82 YOF with known chronic back pain with recent increase in back pain. Recently had UCx checked at urgent care and prescribed macrobid but was contacted to stop after deemed not to have a UTI. UCx checked again in the ED and empiric keflex prescribed. UA with only 6-10 WBC and culture resulted as 20k GBS - normal colonizer and low colony counts, no treatment indicated.  Call the patient to STOP keflex - no additional antibiotics indicated at this time  ED Provider: Wynetta Heckle, MD  Thank you for allowing pharmacy to be a part of this patient's care.  Garland Junk, PharmD, BCPS, BCIDP Infectious Diseases Clinical Pharmacist 11/14/2023 9:19 AM   **Pharmacist phone directory can now be found on [email or web address] ([email or web address]).  Listed under [facility].

## 2023-11-14 NOTE — Telephone Encounter (Signed)
 Post ED Visit - Positive Culture Follow-up: Chart Hand-off to ED Flow Manager  Culture assessed and recommendations reviewed by: []  Deanie Ewings, Pharm.D., BCPS []  Skeet Duke, Pharm.D., BCPS-AQ ID [x]  Garland Junk, Pharm.D., BCPS []  Alice, [place].D., BCPS, AAHIVP []  Alcide Aly, Pharm .D., BCPS, AAHIVP []  Gena Kemp, Pharm.D. []  Marciana Settle, Pharm.D.  Positive urine culture  [x]  Patient discharged with antimicrobial(Cephalexin)  prescription and treatment is now not indicated []  Organism is resistant to prescribed ED discharge antimicrobial []  Patient with positive blood cultures  Changes discussed with ED provider: Dr Wynetta Heckle  " Stop Keflex"  10:56 Pt asked to callback in 20 min. 11:36  Spoke w/pt.  Informed her to Stop taking Keflex.   Georgean Kindle 11/14/2023, 10:52 AM

## 2023-11-14 NOTE — Telephone Encounter (Signed)
 Pt made it to appointment on time checked in at 3:07 pm

## 2023-11-14 NOTE — Progress Notes (Unsigned)
 [facility]ARE [facility] [address] [place] [address] Dept: [phone] Dept Fax: [phone]  Acute Care Office Visit  Subjective:   Deborah Mcbride 02/16/1986 11/14/2023  Chief Complaint  Patient presents with   Hospitalization Follow-up    Pain when urinating  Pain when sitting, standing, walking     HPI: Discussed the use of AI scribe software for clinical note transcription with the patient, who gave verbal consent to proceed.  History of Present Illness   The patient, with a history of chronic back pain, presents  for ER follow up. She was seen in the ER 2 days ago for back and abdominal pain that started 1 week ago. The pain is located in the lower back and radiates to both sides, with the right side being more affected than the left. Abdominal pain has improved.  The back pain is constant and is exacerbated by touch and sitting. The patient also reports an abnormal menstrual cycle that started around the same time as the onset of the current pain. The menstrual blood was bright red, which is unusual for the patient, and the period was shorter than usual (5-7 days compared to her typical 2 weeks per patient). The patient also reports large blood clots during this cycle. Not currently experiencing any vaginal bleeding. The patient denies any recent injuries. The patient has been on antibiotics for a suspected UTI for less than two days, which was prescribed by ER for suspecting UTI could be contributing to her pain. She was also given Robaxin, which she is hesitant to take as it makes her sleepy and it concerns her with her sleep apnea.  She states she has been evaluated for the back pain in the past by orthopedics at [facility] (unable to see records). The patient also reports numbness and burning in the leg, which she suspects could be related to her back issues.         The following portions of the patient's history were  reviewed and updated as appropriate: past medical history, past surgical history, family history, social history, allergies, medications, and problem list.   Patient Active Problem List   Diagnosis Date Noted   Elevated LDL cholesterol level 10/12/2023   Precordial pain 10/11/2023   Chronic migraine without aura with status migrainosus, not intractable 09/05/2023   Iron deficiency anemia 08/01/2023   Insulin resistance 07/31/2023   Hypertriglyceridemia 07/31/2023   Vitamin D deficiency 07/31/2023   Hepatosplenomegaly 04/10/2023   Nasal congestion 01/19/2023   Sinus tachycardia 12/07/2022   OSA (obstructive sleep apnea) 10/21/2022   BMI 50.0-59.9, adult (HCC) 08/03/2022   Morbid obesity (HCC) 07/07/2022   OSA treated with BiPAP 07/07/2022   Gastroesophageal reflux disease without esophagitis 07/07/2022   Past Medical History:  Diagnosis Date   Anemia    Back pain    GERD (gastroesophageal reflux disease)    Joint pain    Obesity    Sleep apnea    Vitamin D deficiency    Past Surgical History:  Procedure Laterality Date   CESAREAN SECTION     CHOLECYSTECTOMY     TONSILLECTOMY     TUBAL LIGATION     Family History  Problem Relation Age of Onset   Diabetes Father    High blood pressure Father    Sleep apnea Father    Obesity Father     Current Outpatient Medications:    acetaminophen (TYLENOL) 500 MG tablet, Take 2 tablets (1,000 mg total)  by mouth every 6 (six) hours as needed for mild pain (pain score 1-3) or moderate pain (pain score 4-6)., Disp: 100 tablet, Rfl: 0   Erenumab-aooe (AIMOVIG) 70 MG/ML SOAJ, Inject 1 mL into the skin every 30 (thirty) days., Disp: 1 mL, Rfl: 3   famotidine (PEPCID) 20 MG tablet, Take 1 tablet (20 mg total) by mouth as needed for heartburn or indigestion., Disp: 90 tablet, Rfl: 1   famotidine (PEPCID) 40 MG tablet, Take 1 tablet (40 mg total) by mouth daily., Disp: 90 tablet, Rfl: 1   ibuprofen (ADVIL) 800 MG tablet, Take 1 tablet (800 mg  total) by mouth every 8 (eight) hours as needed for mild pain (pain score 1-3) or moderate pain (pain score 4-6)., Disp: 30 tablet, Rfl: 0   Iron, Ferrous Sulfate, 325 (65 Fe) MG TABS, Take 1 tablet (325mg ) by mouth daily., Disp: 100 tablet, Rfl: 1   lidocaine (XYLOCAINE) 2 % solution, TAKE 5 MLS BY MOUTH THREE TIMES DAILY AS NEEDED FOR PAIN. SWISH AND GARGLE. KEEP IN MOUTH FOR 30 SECONDS AND SPIT, Disp: , Rfl:    methocarbamol (ROBAXIN) 500 MG tablet, Take 1 tablet (500 mg total) by mouth 2 (two) times daily for 7 days., Disp: 14 tablet, Rfl: 0   omeprazole (PRILOSEC) 40 MG capsule, Take 1 capsule (40 mg total) by mouth 2 (two) times daily., Disp: 60 capsule, Rfl: 1   ondansetron (ZOFRAN) 4 MG tablet, Take 1 tablet (4 mg total) by mouth every 8 (eight) hours as needed for nausea or vomiting., Disp: 20 tablet, Rfl: 0   tirzepatide (ZEPBOUND) 7.5 MG/0.5ML Pen, Inject 7.5 mg into the skin once a week., Disp: 2 mL, Rfl: 2   Ubrogepant (UBRELVY) 100 MG TABS, Take 1 tablet by mouth at onset of migraine. May repeat dose in 2 hours if migraine persists. Max daily dose 2 tablets/day., Disp: 16 tablet, Rfl: 2   Vitamin D, Ergocalciferol, (DRISDOL) 1.25 MG (50000 UNIT) CAPS capsule, Take 1 capsule (50,000 Units total) by mouth every 7 (seven) days., Disp: 12 capsule, Rfl: 1   cephALEXin (KEFLEX) 500 MG capsule, Take 1 capsule (500 mg total) by mouth 2 (two) times daily for 7 days. (Patient not taking: Reported on 11/14/2023), Disp: 14 capsule, Rfl: 0   nitrofurantoin, macrocrystal-monohydrate, (MACROBID) 100 MG capsule, Take 100 mg by mouth 2 (two) times daily. (Patient not taking: Reported on 11/14/2023), Disp: , Rfl:  Allergies  Allergen Reactions   Fentanyl Shortness Of Breath, Itching and Nausea And Vomiting    rash   Hydromorphone Itching   Dilaudid [Hydromorphone Hcl] Itching   Morphine Other (See Comments)     ROS: A complete ROS was performed with pertinent positives/negatives noted in the HPI. The  remainder of the ROS are negative.    Objective:   Today's Vitals   11/14/23 1513  BP: 120/74  Pulse: 65  Temp: 98.2 F (36.8 C)  TempSrc: Temporal  SpO2: 98%  Weight: (!) 321 lb 6.4 oz (145.8 kg)  Height: 5\' 3"  (1.6 m)    GENERAL: Well-appearing, in NAD. Well nourished.  SKIN: Pink, warm and dry. No rash, lesion, ulceration, or ecchymoses.  NECK: Trachea midline. Full ROM w/o pain or tenderness. No lymphadenopathy.  RESPIRATORY: Chest wall symmetrical. Respirations even and non-labored. Breath sounds clear to auscultation bilaterally.  CARDIAC: S1, S2 present, regular rate and rhythm. Peripheral pulses 2+ bilaterally.  MSK: Muscle tone and strength appropriate for age. Joints w/o tenderness, redness, or swelling. TTP diffuse lower back  pain EXTREMITIES: Without clubbing, cyanosis, or edema.  NEUROLOGIC: No motor or sensory deficits. Steady, even gait.  PSYCH/MENTAL STATUS: Alert, oriented x 3. Cooperative, appropriate mood and affect.    No results found for any visits on 11/14/23.    Assessment & Plan:  Assessment and Plan    Urinary Tract Infection (UTI) Urine culture showed group B strep.  - Continue Keflex 500 mg twice daily for 7 days.  Chronic Lower Back Pain Chronic pain with recent exacerbation.  - Refer to orthopedics for further evaluation and management. - Consider re-imaging with MRI to assess changes in the spine.  Abnormal Menstrual Bleeding Recent cycle shorter with bright red bleeding and large clots. Possible ovarian cyst rupture. Concerns about surgical intervention due to weight. - Follow up with OBGYN to discuss menstrual irregularities and potential surgical options.      Orders Placed This Encounter  Procedures   Ambulatory referral to Orthopedic Surgery    Referral Priority:   Routine    Referral Type:   Surgical    Referral Reason:   Specialty Services Required    Requested Specialty:   Orthopedic Surgery    Number of Visits Requested:   1    Lab Orders  No laboratory test(s) ordered today   No images are attached to the encounter or orders placed in the encounter.  Return for Scheduled Routine Office Visits and as needed.   Gavin Kast, FNP

## 2023-11-15 ENCOUNTER — Other Ambulatory Visit (HOSPITAL_COMMUNITY): Payer: Self-pay

## 2023-11-16 ENCOUNTER — Other Ambulatory Visit (HOSPITAL_BASED_OUTPATIENT_CLINIC_OR_DEPARTMENT_OTHER)

## 2023-11-20 DIAGNOSIS — G4733 Obstructive sleep apnea (adult) (pediatric): Secondary | ICD-10-CM | POA: Diagnosis not present

## 2023-11-22 ENCOUNTER — Other Ambulatory Visit: Payer: Self-pay

## 2023-11-22 ENCOUNTER — Other Ambulatory Visit (HOSPITAL_COMMUNITY): Payer: Self-pay

## 2023-11-23 ENCOUNTER — Ambulatory Visit: Admitting: Physical Medicine and Rehabilitation

## 2023-11-23 DIAGNOSIS — G4733 Obstructive sleep apnea (adult) (pediatric): Secondary | ICD-10-CM | POA: Diagnosis not present

## 2023-11-27 ENCOUNTER — Other Ambulatory Visit: Payer: Self-pay

## 2023-11-27 ENCOUNTER — Other Ambulatory Visit (HOSPITAL_COMMUNITY): Payer: Self-pay

## 2023-11-27 ENCOUNTER — Other Ambulatory Visit: Payer: Self-pay | Admitting: Internal Medicine

## 2023-11-27 DIAGNOSIS — K219 Gastro-esophageal reflux disease without esophagitis: Secondary | ICD-10-CM

## 2023-11-27 MED ORDER — OMEPRAZOLE 40 MG PO CPDR
40.0000 mg | DELAYED_RELEASE_CAPSULE | Freq: Two times a day (BID) | ORAL | 1 refills | Status: DC
  Filled 2023-11-27: qty 60, 30d supply, fill #0
  Filled 2024-01-22: qty 60, 30d supply, fill #1

## 2023-11-30 DIAGNOSIS — G4733 Obstructive sleep apnea (adult) (pediatric): Secondary | ICD-10-CM | POA: Diagnosis not present

## 2023-12-06 ENCOUNTER — Telehealth (INDEPENDENT_AMBULATORY_CARE_PROVIDER_SITE_OTHER): Payer: Self-pay

## 2023-12-06 NOTE — Telephone Encounter (Signed)
 PA for Zepbound 2.5 has been submitted, awaiting PA questions.

## 2023-12-07 NOTE — Telephone Encounter (Signed)
 PA for Zepbound  came back stating a PA was not needed.

## 2023-12-18 ENCOUNTER — Other Ambulatory Visit (HOSPITAL_COMMUNITY): Payer: Self-pay

## 2023-12-18 ENCOUNTER — Telehealth: Payer: Self-pay

## 2023-12-18 NOTE — Telephone Encounter (Signed)
 Error

## 2023-12-18 NOTE — Telephone Encounter (Signed)
 Pharmacy Patient Advocate Encounter   Received notification from CoverMyMeds that prior authorization for Aimovig  70MG /ML auto-injectors is required/requested.   Insurance verification completed.   The patient is insured through [facility] .   Per insurance:  The insurance requires beneficiary to utilize prophylactic intervention modalities ( behavioral therapy, physical therapy, life-style modifications) Please update chart notes if these apply to patient, these must be met before they can approve the request for Aimovig  70MG /ML auto-injectors.

## 2023-12-18 NOTE — Telephone Encounter (Signed)
 Pharmacy Patient Advocate Encounter   Received notification from CoverMyMeds that prior authorization for Aimovig  70MG /ML auto-injectors is required/requested.   Insurance verification completed.   The patient is insured through [facility] .   Per test claim: PA required; PA submitted to above mentioned insurance via CoverMyMeds Key/confirmation #/EOC  [id number] Status is pending

## 2023-12-18 NOTE — Telephone Encounter (Signed)
 error

## 2023-12-19 ENCOUNTER — Ambulatory Visit: Payer: Self-pay

## 2023-12-19 ENCOUNTER — Other Ambulatory Visit (HOSPITAL_BASED_OUTPATIENT_CLINIC_OR_DEPARTMENT_OTHER): Payer: Self-pay

## 2023-12-19 ENCOUNTER — Other Ambulatory Visit (HOSPITAL_COMMUNITY): Payer: Self-pay

## 2023-12-19 ENCOUNTER — Encounter: Payer: Self-pay | Admitting: Internal Medicine

## 2023-12-19 ENCOUNTER — Ambulatory Visit (INDEPENDENT_AMBULATORY_CARE_PROVIDER_SITE_OTHER): Admitting: Internal Medicine

## 2023-12-19 VITALS — BP 128/68 | HR 76 | Temp 97.7°F | Ht 63.0 in | Wt 324.2 lb

## 2023-12-19 DIAGNOSIS — G43701 Chronic migraine without aura, not intractable, with status migrainosus: Secondary | ICD-10-CM

## 2023-12-19 MED ORDER — QULIPTA 60 MG PO TABS
60.0000 mg | ORAL_TABLET | Freq: Every day | ORAL | 11 refills | Status: DC
  Filled 2023-12-19 – 2023-12-27 (×5): qty 30, 30d supply, fill #0

## 2023-12-19 NOTE — Telephone Encounter (Signed)
 Copied from CRM [id number]. Topic: Clinical - Red Word Triage >> Dec 19, 2023 12:37 PM Howard Macho wrote: Reason for CRM: patient called stating she is checking on a prior authorization for migraine medication and she does not know if she need to come in to make an appointment. Patient has been having migraines more frequently the past week. Patient has been alternating between UBRELVY , tylenol  and ibuprofen     Chief Complaint: Migraines, has 3 in the past 3 days. Need PA for Aimovig  Symptoms: pain Frequency: More recent Pertinent Negatives: Patient denies  Disposition: [] ED /[] Urgent Care (no appt availability in office) / [x] Appointment(In office/virtual)/ []  [facility]/ [] Home Care/ [] Refused Recommended Disposition /[] [facility]/ []  Follow-up with PCP Additional Notes: agrees with appointment.  Reason for Disposition  [1] MODERATE headache (e.g., interferes with normal activities) AND [2] present > 24 hours AND [3] unexplained  (Exceptions: analgesics not tried, typical migraine, or headache part of viral illness)  Answer Assessment - Initial Assessment Questions 1. LOCATION: "Where does it hurt?"      All over 2. ONSET: "When did the headache start?" (Minutes, hours or days)      Daily x 3 days 3. PATTERN: "Does the pain come and go, or has it been constant since it started?"     Comes and goes 4. SEVERITY: "How bad is the pain?" and "What does it keep you from doing?"  (e.g., Scale 1-10; mild, moderate, or severe)   - MILD (1-3): doesn't interfere with normal activities    - MODERATE (4-7): interferes with normal activities or awakens from sleep    - SEVERE (8-10): excruciating pain, unable to do any normal activities        8-10 5. RECURRENT SYMPTOM: "Have you ever had headaches before?" If Yes, ask: "When was the last time?" and "What happened that time?"      yes 6. CAUSE: "What do you think is causing the headache?"     migraine 7. MIGRAINE: "Have you been  diagnosed with migraine headaches?" If Yes, ask: "Is this headache similar?"      yes 8. HEAD INJURY: "Has there been any recent injury to the head?"      no 9. OTHER SYMPTOMS: "Do you have any other symptoms?" (fever, stiff neck, eye pain, sore throat, cold symptoms)     Nausea, light sensitivity 10. PREGNANCY: "Is there any chance you are pregnant?" "When was your last menstrual period?"       no  Protocols used: Headache-A-AH

## 2023-12-19 NOTE — Telephone Encounter (Signed)
 Patient scheduled for this afternoon.

## 2023-12-19 NOTE — Progress Notes (Signed)
 [facility] [address] [place] [address] Dept: [phone] Dept Fax: [phone]  Acute Care Office Visit  Subjective:   Deborah Mcbride [birth year] 12/19/2023  Chief Complaint  Patient presents with   Migraine    Migraines coming and going     HPI: Deborah Mcbride is  a 38 yo F who presents for complaint of worsening migraines for the past 3-4 days. She has been taking ubrelvy  for acute migraine management. States that the Ubrelvy  does help, but as soon as it wears off migraine returns. Associated light and sound sensitivity, nausea. Denies vomiting. No acute vision changes. She has been on Topamax, Metoprolol, and Amitriptyline in the past for migraine prevention which did not alleviate migraine frequency. She has been on Aimovig  in the past which did significantly improve and reduce migraine frequency. PA's have been submitted and appeal submitted for insurance approval for Aimovig , but keeps getting denied. Patient is doing strength- training exercises at home, but is limited currently due to chronic back pain. Was referred to orthopedics for further evaluation, she is going to schedule appt.  She has implemented lifestyle modifications by avoiding sodas, avoiding migraine trigger foods such as chocolate, sweets, greasy/fatty foods. Patient has been increasing her walking greater than 30 minutes 7 days a week. She is avoiding loud noises , which trigger migraines.   Currently has had 10-15 migraines per month.  Reports slight headache currently  - declines any medication treatment at office visit today as she did drive herself to appointment and does not want to be drowsy.    The following portions of the patient's history were reviewed and updated as appropriate: past medical history, past surgical history, family history, social history, allergies, medications, and problem list.   Patient Active Problem List   Diagnosis  Date Noted   Elevated LDL cholesterol level 10/12/2023   Precordial pain 10/11/2023   Chronic migraine without aura with status migrainosus, not intractable 09/05/2023   Iron  deficiency anemia 08/01/2023   Insulin  resistance 07/31/2023   Hypertriglyceridemia 07/31/2023   Vitamin D  deficiency 07/31/2023   Hepatosplenomegaly 04/10/2023   Nasal congestion 01/19/2023   Sinus tachycardia 12/07/2022   OSA (obstructive sleep apnea) 10/21/2022   BMI 50.0-59.9, adult (HCC) 08/03/2022   Morbid obesity (HCC) 07/07/2022   OSA treated with BiPAP 07/07/2022   Gastroesophageal reflux disease without esophagitis 07/07/2022   Past Medical History:  Diagnosis Date   Anemia    Back pain    GERD (gastroesophageal reflux disease)    Joint pain    Obesity    Sleep apnea    Vitamin D  deficiency    Past Surgical History:  Procedure Laterality Date   CESAREAN SECTION     CHOLECYSTECTOMY     TONSILLECTOMY     TUBAL LIGATION     Family History  Problem Relation Age of Onset   Diabetes Father    High blood pressure Father    Sleep apnea Father    Obesity Father     Current Outpatient Medications:    acetaminophen  (TYLENOL ) 500 MG tablet, Take 2 tablets (1,000 mg total) by mouth every 6 (six) hours as needed for mild pain (pain score 1-3) or moderate pain (pain score 4-6)., Disp: 100 tablet, Rfl: 0   Atogepant (QULIPTA) 60 MG TABS, Take 1 tablet (60 mg total) by mouth daily. For migraine prevention., Disp: 30 tablet, Rfl: 11   famotidine  (PEPCID ) 20 MG tablet, Take 1 tablet (20 mg  total) by mouth as needed for heartburn or indigestion., Disp: 90 tablet, Rfl: 1   famotidine  (PEPCID ) 40 MG tablet, Take 1 tablet (40 mg total) by mouth daily., Disp: 90 tablet, Rfl: 1   ibuprofen  (ADVIL ) 800 MG tablet, Take 1 tablet (800 mg total) by mouth every 8 (eight) hours as needed for mild pain (pain score 1-3) or moderate pain (pain score 4-6)., Disp: 30 tablet, Rfl: 0   Iron , Ferrous Sulfate , 325 (65 Fe) MG  TABS, Take 1 tablet (325mg ) by mouth daily., Disp: 100 tablet, Rfl: 1   lidocaine  (XYLOCAINE ) 2 % solution, TAKE 5 MLS BY MOUTH THREE TIMES DAILY AS NEEDED FOR PAIN. SWISH AND GARGLE. KEEP IN MOUTH FOR 30 SECONDS AND SPIT, Disp: , Rfl:    nitrofurantoin, macrocrystal-monohydrate, (MACROBID) 100 MG capsule, Take 100 mg by mouth 2 (two) times daily., Disp: , Rfl:    omeprazole  (PRILOSEC) 40 MG capsule, Take 1 capsule (40 mg total) by mouth 2 (two) times daily., Disp: 60 capsule, Rfl: 1   ondansetron  (ZOFRAN ) 4 MG tablet, Take 1 tablet (4 mg total) by mouth every 8 (eight) hours as needed for nausea or vomiting., Disp: 20 tablet, Rfl: 0   tirzepatide  (ZEPBOUND ) 7.5 MG/0.5ML Pen, Inject 7.5 mg into the skin once a week., Disp: 2 mL, Rfl: 2   Ubrogepant  (UBRELVY ) 100 MG TABS, Take 1 tablet by mouth at onset of migraine. May repeat dose in 2 hours if migraine persists. Max daily dose 2 tablets/day., Disp: 16 tablet, Rfl: 2   Vitamin D , Ergocalciferol , (DRISDOL ) 1.25 MG (50000 UNIT) CAPS capsule, Take 1 capsule (50,000 Units total) by mouth every 7 (seven) days., Disp: 12 capsule, Rfl: 1 Allergies  Allergen Reactions   Fentanyl  Shortness Of Breath, Itching and Nausea And Vomiting    rash   Hydromorphone Itching   Dilaudid [Hydromorphone Hcl] Itching   Morphine  Other (See Comments)     ROS: A complete ROS was performed with pertinent positives/negatives noted in the HPI. The remainder of the ROS are negative.    Objective:   Today's Vitals   12/19/23 1547  BP: 128/68  Pulse: 76  Temp: 97.7 F (36.5 C)  TempSrc: Temporal  SpO2: 97%  Weight: (!) 324 lb 3.2 oz (147.1 kg)  Height: 5\' 3"  (1.6 m)    GENERAL: Well-appearing, in NAD. Well nourished.  SKIN: Pink, warm and dry. No rash, lesion, ulceration, or ecchymoses.  RESPIRATORY:  Respirations even and non-labored.  NEUROLOGIC: No motor or sensory deficits. Steady, even gait.  PSYCH/MENTAL STATUS: Alert, oriented x 3. Cooperative,  appropriate mood and affect.    No results found for any visits on 12/19/23.    Assessment & Plan:  1. Chronic migraine without aura with status migrainosus, not intractable (Primary) - Atogepant (QULIPTA) 60 MG TABS; Take 1 tablet (60 mg total) by mouth daily. For migraine prevention.  Dispense: 30 tablet; Refill: 11 - continue ubrelvy  for episodic migraines  - d/c Aimovig  due to no insurance approval.   Meds ordered this encounter  Medications   Atogepant (QULIPTA) 60 MG TABS    Sig: Take 1 tablet (60 mg total) by mouth daily. For migraine prevention.    Dispense:  30 tablet    Refill:  11    Supervising Provider:   THOMPSON, AARON B [[id number]]   No orders of the defined types were placed in this encounter.  Lab Orders  No laboratory test(s) ordered today   No images are attached to the encounter or  orders placed in the encounter.  Return for Scheduled Routine Office Visits and as needed.   Gavin Kast, FNP

## 2023-12-20 ENCOUNTER — Telehealth: Payer: Self-pay

## 2023-12-20 ENCOUNTER — Other Ambulatory Visit (HOSPITAL_COMMUNITY): Payer: Self-pay

## 2023-12-20 DIAGNOSIS — Z0279 Encounter for issue of other medical certificate: Secondary | ICD-10-CM

## 2023-12-20 NOTE — Telephone Encounter (Signed)
Forms received and placed in PCP folder.

## 2023-12-20 NOTE — Telephone Encounter (Signed)
 Pharmacy Patient Advocate Encounter   Received notification from CoverMyMeds that prior authorization for Qulipta 60MG  tablets is required/requested.   Insurance verification completed.   The patient is insured through [facility] [place] .   Per test claim: PA required; PA submitted to above mentioned insurance via CoverMyMeds Key/confirmation #/EOC [facility] Status is pending

## 2023-12-21 ENCOUNTER — Other Ambulatory Visit: Payer: Self-pay | Admitting: Internal Medicine

## 2023-12-21 ENCOUNTER — Telehealth: Admitting: Nurse Practitioner

## 2023-12-21 ENCOUNTER — Ambulatory Visit: Payer: Self-pay

## 2023-12-21 ENCOUNTER — Other Ambulatory Visit (HOSPITAL_COMMUNITY): Payer: Self-pay

## 2023-12-21 DIAGNOSIS — L299 Pruritus, unspecified: Secondary | ICD-10-CM | POA: Diagnosis not present

## 2023-12-21 MED ORDER — CETIRIZINE HCL 10 MG PO TABS: 10.0000 mg | ORAL_TABLET | Freq: Every day | ORAL | 0 refills | Status: AC

## 2023-12-21 MED ORDER — TRIAMCINOLONE ACETONIDE 0.5 % EX OINT
1.0000 | TOPICAL_OINTMENT | Freq: Two times a day (BID) | CUTANEOUS | 1 refills | Status: AC | PRN
  Filled 2023-12-21: qty 30, 30d supply, fill #0
  Filled 2024-01-22: qty 30, 30d supply, fill #1

## 2023-12-21 MED ORDER — HYDROCORTISONE 2.5 % EX CREA: TOPICAL_CREAM | Freq: Two times a day (BID) | CUTANEOUS | 0 refills | Status: AC

## 2023-12-21 NOTE — Telephone Encounter (Signed)
 Please notify patient I have sent in Kenalog cream for her

## 2023-12-21 NOTE — Telephone Encounter (Signed)
 Chief Complaint: mosquito bites Symptoms: redness, itching Frequency: since Sunday Pertinent Negatives: Patient denies fever, chills, difficulty breathing or walking, swelling Disposition: [] ED /[] Urgent Care (no appt availability in office) / [] Appointment(In office/virtual)/ []  [facility]/ [] Home Care/ [] Refused Recommended Disposition /[] [facility]/ [x]  Follow-up with PCP Additional Notes: Pt reports mosquito bites to both legs. Pt states she was bit on Sunday while outside at a friend's house playing corn hole. Pt was seen in the office two days ago and states she mentioned to the provider the bites. Pt endorses severe itching. Pt has tried calamine lotion, Benadryl  spray, hydrocortisone cream, ice packs, antibacterial soap and cream, witch hazel, toothpaste, and petroleum jelly w/ no relief. Pt is requesting a cream or medication from the provider to assist with itching. Being that pt was seen 2 days ago, RN advised pt RN would relay symptoms to the provider for follow-up. RN advised the pt if she develops swelling, difficulty walking, fever, or difficulty breathing she needs to go to the ED. Pt verbalized understanding.     Copied from CRM [id number]. Topic: Clinical - Medical Advice >> Dec 21, 2023  8:38 AM Marlan Silva wrote: Reason for CRM: Patient was seen in office on Monday. She mentioned to Odilia Bennett that she has gotten a lot of mosquito bites and the itching is unbearable, She is requesting an antibiotic for relief. She said she has tried everything she has thought of to relieve the itching but nothing is working. Patient is requesting a call back from NP Gavin Kast and can be reached at [phone]. Answer Assessment - Initial Assessment Questions 1. LOCATION: "Where are the mosquito bites located?"      Legs  2. ONSET: "When did you get bitten?"      Sunday - went to a friend's house and was outside playing corn hole, was bit by many mosquitos. On Monday bites  became very itchy.  3. REDNESS: "Is the area red or pink?" If Yes, ask: "What size is area of redness?" (inches or cm). "When did the redness start?"     "They look like mosquito bites, same shape", "when we were playing corn hole I could feel them biting me", endorses redness, "they hurt a little from when I have been scratching them" 4. ITCHING: "Does it itch?" If Yes, ask: "How bad is the itch?"    - MILD: doesn't interfere with normal activities   - MODERATE-SEVERE: interferes with work, school, sleep, or other activities      "Itching is uncontrollable, it's unbearable" 5. SWELLING: "How big is the swelling?" (inches, cm, or compare to coins)     "Not really" 6. OTHER SYMPTOMS: "Do you have any other symptoms?"  (e.g., difficulty breathing, hives)     Pt has tried hydrocortisone cream, Benadryl  spray, pink calamine lotion, antibacterial soap and cream, chest rub, witch hazel, petroleum jelly, toothpaste. Denies fever, chills, difficulty breathing.  Protocols used: Mosquito Bite-A-AH

## 2023-12-21 NOTE — Patient Instructions (Signed)
 Deborah  D Andy Mcbride, thank you for joining Collins Dean, NP for today's virtual visit.  While this provider is not your primary care provider (PCP), if your PCP is located in our provider database this encounter information will be shared with them immediately following your visit.   A [facility] MyChart account gives you access to today's visit and all your visits, tests, and labs performed at [facility] " click here if you don't have a  [facility] MyChart account or go to [email or web address]  Consent: (Patient) Deborah Mcbride provided verbal consent for this virtual visit at the beginning of the encounter.  Current Medications:  Current Outpatient Medications:    cetirizine  (ZYRTEC ) 10 MG tablet, Take 1 tablet (10 mg total) by mouth daily. For itching, Disp: 30 tablet, Rfl: 0   hydrocortisone  2.5 % cream, Apply topically 2 (two) times daily., Disp: 30 g, Rfl: 0   acetaminophen  (TYLENOL ) 500 MG tablet, Take 2 tablets (1,000 mg total) by mouth every 6 (six) hours as needed for mild pain (pain score 1-3) or moderate pain (pain score 4-6)., Disp: 100 tablet, Rfl: 0   Atogepant  (QULIPTA ) 60 MG TABS, Take 1 tablet (60 mg total) by mouth daily. For migraine prevention., Disp: 30 tablet, Rfl: 11   famotidine  (PEPCID ) 20 MG tablet, Take 1 tablet (20 mg total) by mouth as needed for heartburn or indigestion., Disp: 90 tablet, Rfl: 1   famotidine  (PEPCID ) 40 MG tablet, Take 1 tablet (40 mg total) by mouth daily., Disp: 90 tablet, Rfl: 1   ibuprofen  (ADVIL ) 800 MG tablet, Take 1 tablet (800 mg total) by mouth every 8 (eight) hours as needed for mild pain (pain score 1-3) or moderate pain (pain score 4-6)., Disp: 30 tablet, Rfl: 0   Iron , Ferrous Sulfate , 325 (65 Fe) MG TABS, Take 1 tablet (325mg ) by mouth daily., Disp: 100 tablet, Rfl: 1   lidocaine  (XYLOCAINE ) 2 % solution, TAKE 5 MLS BY MOUTH THREE TIMES DAILY AS NEEDED FOR PAIN. SWISH AND GARGLE. KEEP IN MOUTH FOR 30 SECONDS AND  SPIT, Disp: , Rfl:    nitrofurantoin, macrocrystal-monohydrate, (MACROBID) 100 MG capsule, Take 100 mg by mouth 2 (two) times daily., Disp: , Rfl:    omeprazole  (PRILOSEC) 40 MG capsule, Take 1 capsule (40 mg total) by mouth 2 (two) times daily., Disp: 60 capsule, Rfl: 1   ondansetron  (ZOFRAN ) 4 MG tablet, Take 1 tablet (4 mg total) by mouth every 8 (eight) hours as needed for nausea or vomiting., Disp: 20 tablet, Rfl: 0   tirzepatide  (ZEPBOUND ) 7.5 MG/0.5ML Pen, Inject 7.5 mg into the skin once a week., Disp: 2 mL, Rfl: 2   Ubrogepant  (UBRELVY ) 100 MG TABS, Take 1 tablet by mouth at onset of migraine. May repeat dose in 2 hours if migraine persists. Max daily dose 2 tablets/day., Disp: 16 tablet, Rfl: 2   Vitamin D , Ergocalciferol , (DRISDOL ) 1.25 MG (50000 UNIT) CAPS capsule, Take 1 capsule (50,000 Units total) by mouth every 7 (seven) days., Disp: 12 capsule, Rfl: 1   Medications ordered in this encounter:  Meds ordered this encounter  Medications   hydrocortisone  2.5 % cream    Sig: Apply topically 2 (two) times daily.    Dispense:  30 g    Refill:  0    Supervising Provider:   Corine Dice [[id number]]   cetirizine  (ZYRTEC ) 10 MG tablet    Sig: Take 1 tablet (10 mg total) by mouth daily. For itching    Dispense:  30  tablet    Refill:  0    Supervising Provider:   Corine Dice [[id number]]     *If you need refills on other medications prior to your next appointment, please contact your pharmacy*  Follow-Up: Call back or seek an in-person evaluation if the symptoms worsen or if the condition fails to improve as anticipated.  [facility] [phone]   If you have been instructed to have an in-person evaluation today at a local Urgent Care facility, please use the link below. It will take you to a list of all of our available [facility], including address, phone number and hours of operation. Please do not delay care.  [facility]  If  you or a family member do not have a primary care provider, use the link below to schedule a visit and establish care. When you choose a [facility] primary care physician or advanced practice provider, you gain a long-term partner in health. Find a Primary Care Provider  Learn more about [facility]'s in-office and virtual care options:  - Get Care Now

## 2023-12-21 NOTE — Progress Notes (Signed)
 Virtual Visit Consent   Deborah  D Mcbride, you are scheduled for a virtual visit with a [facility] provider today. Just as with appointments in the office, your consent must be obtained to participate. Your consent will be active for this visit and any virtual visit you may have with one of our providers in the next 365 days. If you have a MyChart account, a copy of this consent can be sent to you electronically.  As this is a virtual visit, video technology does not allow for your provider to perform a traditional examination. This may limit your provider's ability to fully assess your condition. If your provider identifies any concerns that need to be evaluated in person or the need to arrange testing (such as labs, EKG, etc.), we will make arrangements to do so. Although advances in technology are sophisticated, we cannot ensure that it will always work on either your end or our end. If the connection with a video visit is poor, the visit may have to be switched to a telephone visit. With either a video or telephone visit, we are not always able to ensure that we have a secure connection.  By engaging in this virtual visit, you consent to the provision of healthcare and authorize for your insurance to be billed (if applicable) for the services provided during this visit. Depending on your insurance coverage, you may receive a charge related to this service.  I need to obtain your verbal consent now. Are you willing to proceed with your visit today? Deborah  D Mcbride has provided verbal consent on 12/21/2023 for a virtual visit (video or telephone). Deborah Dean, NP  Date: 12/21/2023 10:16 AM   Virtual Visit via Video Note   I, Deborah Mcbride, connected with  Deborah  ARION Mcbride  ([id number], Jul 29, 1986) on 12/21/23 at 10:15 AM EDT by a video-enabled telemedicine application and verified that I am speaking with the correct person using two identifiers.  Location: Patient: Virtual Visit Location  Patient: Home Provider: Virtual Visit Location Provider: Home Office   I discussed the limitations of evaluation and management by telemedicine and the availability of in person appointments. The patient expressed understanding and agreed to proceed.    History of Present Illness: Deborah  D Mcbride is a 38 y.o. who identifies as a female who was assigned female at birth, and is being seen today for mosquito bites. .  [name] states she was bitten by numerous mosquitos over the weekend.  Despite using over-the-counter calamine lotion, Benadryl  spray, hydrocortisone  cream and toothpaste she has been experiencing intense itching and would like something to relieve this.  Calamine otion     Problems:  Patient Active Problem List   Diagnosis Date Noted   Elevated LDL cholesterol level 10/12/2023   Precordial pain 10/11/2023   Chronic migraine without aura with status migrainosus, not intractable 09/05/2023   Iron  deficiency anemia 08/01/2023   Insulin  resistance 07/31/2023   Hypertriglyceridemia 07/31/2023   Vitamin D  deficiency 07/31/2023   Hepatosplenomegaly 04/10/2023   Nasal congestion 01/19/2023   Sinus tachycardia 12/07/2022   OSA (obstructive sleep apnea) 10/21/2022   BMI 50.0-59.9, adult (HCC) 08/03/2022   Morbid obesity (HCC) 07/07/2022   OSA treated with BiPAP 07/07/2022   Gastroesophageal reflux disease without esophagitis 07/07/2022    Allergies:  Allergies  Allergen Reactions   Fentanyl  Shortness Of Breath, Itching and Nausea And Vomiting    rash   Hydromorphone Itching   Dilaudid [Hydromorphone Hcl] Itching   Morphine  Other (See  Comments)   Medications:  Current Outpatient Medications:    cetirizine (ZYRTEC) 10 MG tablet, Take 1 tablet (10 mg total) by mouth daily. For itching, Disp: 30 tablet, Rfl: 0   hydrocortisone 2.5 % cream, Apply topically 2 (two) times daily., Disp: 30 g, Rfl: 0   acetaminophen  (TYLENOL ) 500 MG tablet, Take 2 tablets (1,000 mg total) by  mouth every 6 (six) hours as needed for mild pain (pain score 1-3) or moderate pain (pain score 4-6)., Disp: 100 tablet, Rfl: 0   Atogepant (QULIPTA) 60 MG TABS, Take 1 tablet (60 mg total) by mouth daily. For migraine prevention., Disp: 30 tablet, Rfl: 11   famotidine  (PEPCID ) 20 MG tablet, Take 1 tablet (20 mg total) by mouth as needed for heartburn or indigestion., Disp: 90 tablet, Rfl: 1   famotidine  (PEPCID ) 40 MG tablet, Take 1 tablet (40 mg total) by mouth daily., Disp: 90 tablet, Rfl: 1   ibuprofen  (ADVIL ) 800 MG tablet, Take 1 tablet (800 mg total) by mouth every 8 (eight) hours as needed for mild pain (pain score 1-3) or moderate pain (pain score 4-6)., Disp: 30 tablet, Rfl: 0   Iron , Ferrous Sulfate , 325 (65 Fe) MG TABS, Take 1 tablet (325mg ) by mouth daily., Disp: 100 tablet, Rfl: 1   lidocaine  (XYLOCAINE ) 2 % solution, TAKE 5 MLS BY MOUTH THREE TIMES DAILY AS NEEDED FOR PAIN. SWISH AND GARGLE. KEEP IN MOUTH FOR 30 SECONDS AND SPIT, Disp: , Rfl:    nitrofurantoin, macrocrystal-monohydrate, (MACROBID) 100 MG capsule, Take 100 mg by mouth 2 (two) times daily., Disp: , Rfl:    omeprazole  (PRILOSEC) 40 MG capsule, Take 1 capsule (40 mg total) by mouth 2 (two) times daily., Disp: 60 capsule, Rfl: 1   ondansetron  (ZOFRAN ) 4 MG tablet, Take 1 tablet (4 mg total) by mouth every 8 (eight) hours as needed for nausea or vomiting., Disp: 20 tablet, Rfl: 0   tirzepatide  (ZEPBOUND ) 7.5 MG/0.5ML Pen, Inject 7.5 mg into the skin once a week., Disp: 2 mL, Rfl: 2   Ubrogepant  (UBRELVY ) 100 MG TABS, Take 1 tablet by mouth at onset of migraine. May repeat dose in 2 hours if migraine persists. Max daily dose 2 tablets/day., Disp: 16 tablet, Rfl: 2   Vitamin D , Ergocalciferol , (DRISDOL ) 1.25 MG (50000 UNIT) CAPS capsule, Take 1 capsule (50,000 Units total) by mouth every 7 (seven) days., Disp: 12 capsule, Rfl: 1  Observations/Objective: Patient is well-developed, well-nourished in no acute distress.  Resting  comfortably at home.  Head is normocephalic, atraumatic.  No labored breathing.  Speech is clear and coherent with logical content.  Patient is alert and oriented at baseline. No obvious signs of cellulitis on exam today   Assessment and Plan: 1. Pruritic condition (Primary) - hydrocortisone 2.5 % cream; Apply topically 2 (two) times daily.  Dispense: 30 g; Refill: 0 - cetirizine (ZYRTEC) 10 MG tablet; Take 1 tablet (10 mg total) by mouth daily. For itching  Dispense: 30 tablet; Refill: 0   Follow Up Instructions: I discussed the assessment and treatment plan with the patient. The patient was provided an opportunity to ask questions and all were answered. The patient agreed with the plan and demonstrated an understanding of the instructions.  A copy of instructions were sent to the patient via MyChart unless otherwise noted below.   The patient was advised to call back or seek an in-person evaluation if the symptoms worsen or if the condition fails to improve as anticipated.    Iza Preston  Terrye Fiedler, NP

## 2023-12-21 NOTE — Telephone Encounter (Signed)
Message forwarded to PCP

## 2023-12-22 ENCOUNTER — Other Ambulatory Visit (HOSPITAL_COMMUNITY): Payer: Self-pay

## 2023-12-22 NOTE — Telephone Encounter (Signed)
 Pharmacy Patient Advocate Encounter  Received notification from [facility] Medicaid that Prior Authorization for Qulipta 60MG  tablets has been DENIED.  Full denial letter will be uploaded to the media tab. See denial reason below.   PA #/Case ID/Reference #: [id number]

## 2023-12-23 DIAGNOSIS — G4733 Obstructive sleep apnea (adult) (pediatric): Secondary | ICD-10-CM | POA: Diagnosis not present

## 2023-12-24 ENCOUNTER — Other Ambulatory Visit: Payer: Self-pay

## 2023-12-26 ENCOUNTER — Other Ambulatory Visit (HOSPITAL_COMMUNITY): Payer: Self-pay

## 2023-12-26 ENCOUNTER — Telehealth: Payer: Self-pay

## 2023-12-26 ENCOUNTER — Ambulatory Visit: Admitting: Internal Medicine

## 2023-12-26 NOTE — Telephone Encounter (Signed)
 Forwarding message below

## 2023-12-26 NOTE — Telephone Encounter (Signed)
 Pharmacy Patient Advocate Encounter   Received notification from CoverMyMeds that prior authorization for AJOVY (fremanezumab-vfrm) injection 225MG /1.5ML auto-injectors is required/requested.   Insurance verification completed.   The patient is insured through [facility] .   Per test claim: PA required; PA submitted to above mentioned insurance via CoverMyMeds Key/confirmation #/EOC (Key: [id number]) Status is pending

## 2023-12-26 NOTE — Telephone Encounter (Signed)
   Office has been made aware of pref'd Alt in pts [place].And made aware that Alternative ''AJOVY INJ'' has been Approved Instead.

## 2023-12-26 NOTE — Telephone Encounter (Signed)
 Please notify patient that the Genna Khan was denied, but the pharmacy team has gotten Ajovy approved for her. This is similar to Aimovig  that she has been on in the past. Would start at once a month injections, and can increase to once every 3 months if she tolerates well. Please let me know if she would like to try this.

## 2023-12-26 NOTE — Telephone Encounter (Signed)
 Hi,    Please note that a Prior Authorization was denied for Qulipta.This was due to : patient not trying and failing 2 out of 3 of the Ins pref'd drugs. It is likely an appeal  for qulipta would not have a successful outcome without trial and failure of 2 of the pref'd drugs. I see the pateint has tried Emgality so that's one trialed drug for now. At this time I have gotten the Ajovy Approved. If you would like to switch therapy. Please advise

## 2023-12-27 ENCOUNTER — Ambulatory Visit: Payer: Self-pay | Admitting: *Deleted

## 2023-12-27 ENCOUNTER — Other Ambulatory Visit (HOSPITAL_COMMUNITY): Payer: Self-pay

## 2023-12-27 NOTE — Telephone Encounter (Signed)
  Chief Complaint: medication questions regarding Ajovy injections and Quilipta Symptoms: migraine persists  Frequency: today Pertinent Negatives: Patient denies na  Disposition: [] ED /[] Urgent Care (no appt availability in office) / [] Appointment(In office/virtual)/ []  [facility]/ [] Home Care/ [] Refused Recommended Disposition /[] [facility]/ [x]  Follow-up with PCP Additional Notes:   Patient reports she had to leave work again today due to migraine. Medication not helping with relief. Patient requesting if PCP will issue any "samples" from office and or issue another "savings card" to submit to pharmacy . Last "savings card" from 12/19/23 for Deborah Mcbride did not work and pharmacist said "something was wrong with that card". Patient did not get to try Quilipta. Please advise and patient requesting a call back.  NT reviewed messages from 12/26/23, regarding medication PA and patient continues to have questions.      Copied from CRM [id number]. Topic: Clinical - Medication Question >> Dec 27, 2023 10:24 AM Kita Perish H wrote: Reason for CRM: Patient is calling in regards to message she received regarding the Ajovy being approved instead of the Qulipta , states the Ajovy was never called in but has some questions for the nurse regarding the Ajovy since she's never had medication or talked about it with provider.  Deborah Mcbride  [phone] Reason for Disposition  Caller wants to use a complementary or alternative medicine  Answer Assessment - Initial Assessment Questions 1. NAME of MEDICINE: "What medicine(s) are you calling about?"     AJovy injection and quilipta  2. QUESTION: "What is your question?" (e.g., double dose of medicine, side effect)     Can samples be given and or another "savings card"  be issued for Deborah Mcbride? 3. PRESCRIBER: "Who prescribed the medicine?" Reason: if prescribed by specialist, call should be referred to that group.     PCP 4. SYMPTOMS: "Do you have any  symptoms?" If Yes, ask: "What symptoms are you having?"  "How bad are the symptoms (e.g., mild, moderate, severe)     Migraine persists and patient had to leave 5. PREGNANCY:  "Is there any chance that you are pregnant?" "When was your last menstrual period?"     na  Protocols used: Medication Question Call-A-AH

## 2023-12-27 NOTE — Telephone Encounter (Signed)
**Note De-identified  [place] Obfuscation** Please advise 

## 2023-12-27 NOTE — Telephone Encounter (Signed)
 Pharmacy Patient Advocate Encounter  Received notification from [facility] that Prior Authorization for AJOVY 225mg /1.71ml  has been APPROVED from 12/26/23 to 03/19/24   PA #/Case ID/Reference #: [id number]

## 2023-12-29 ENCOUNTER — Other Ambulatory Visit (HOSPITAL_COMMUNITY): Payer: Self-pay

## 2023-12-29 DIAGNOSIS — G4733 Obstructive sleep apnea (adult) (pediatric): Secondary | ICD-10-CM | POA: Diagnosis not present

## 2023-12-29 NOTE — Telephone Encounter (Signed)
 Please notify the patient that her insurance was not approving the [place], but did approve the Ajovy.  Our office does not do any medication samples.  The Ajovy works very similar to her Aimovig , and has once a month injection dosing.  I would recommend we try the Ajovy since her insurance is willing to approve this and it is similar to her Aimovig  (which she had a great response to in the past).  If she is willing to try this I will send in the prescription.  Please let me know.

## 2024-01-01 ENCOUNTER — Other Ambulatory Visit (HOSPITAL_COMMUNITY): Payer: Self-pay

## 2024-01-01 ENCOUNTER — Other Ambulatory Visit: Payer: Self-pay | Admitting: Internal Medicine

## 2024-01-01 DIAGNOSIS — G43701 Chronic migraine without aura, not intractable, with status migrainosus: Secondary | ICD-10-CM

## 2024-01-01 DIAGNOSIS — G4733 Obstructive sleep apnea (adult) (pediatric): Secondary | ICD-10-CM

## 2024-01-01 MED ORDER — AJOVY 225 MG/1.5ML ~~LOC~~ SOAJ
1.5000 mL | SUBCUTANEOUS | 3 refills | Status: DC
  Filled 2024-01-01: qty 1.5, 30d supply, fill #0
  Filled 2024-01-22 – 2024-03-04 (×3): qty 1.5, 30d supply, fill #1

## 2024-01-02 ENCOUNTER — Other Ambulatory Visit: Payer: Self-pay

## 2024-01-02 ENCOUNTER — Other Ambulatory Visit (HOSPITAL_COMMUNITY): Payer: Self-pay

## 2024-01-02 ENCOUNTER — Other Ambulatory Visit: Payer: Self-pay | Admitting: Internal Medicine

## 2024-01-02 DIAGNOSIS — N9489 Other specified conditions associated with female genital organs and menstrual cycle: Secondary | ICD-10-CM | POA: Diagnosis not present

## 2024-01-02 DIAGNOSIS — N921 Excessive and frequent menstruation with irregular cycle: Secondary | ICD-10-CM | POA: Diagnosis not present

## 2024-01-02 DIAGNOSIS — G4733 Obstructive sleep apnea (adult) (pediatric): Secondary | ICD-10-CM

## 2024-01-02 MED ORDER — UBRELVY 100 MG PO TABS
1.0000 | ORAL_TABLET | ORAL | 2 refills | Status: DC | PRN
  Filled 2024-01-02: qty 16, 30d supply, fill #0
  Filled 2024-02-19 – 2024-03-04 (×2): qty 16, 30d supply, fill #1

## 2024-01-02 MED ORDER — ZEPBOUND 7.5 MG/0.5ML ~~LOC~~ SOAJ
7.5000 mg | SUBCUTANEOUS | 2 refills | Status: DC
  Filled 2024-01-02: qty 2, 28d supply, fill #0

## 2024-01-02 MED ORDER — ACETAMINOPHEN 500 MG PO TABS
1000.0000 mg | ORAL_TABLET | Freq: Four times a day (QID) | ORAL | 0 refills | Status: AC | PRN
  Filled 2024-01-02: qty 100, 13d supply, fill #0

## 2024-01-02 MED ORDER — IBUPROFEN 800 MG PO TABS
800.0000 mg | ORAL_TABLET | Freq: Three times a day (TID) | ORAL | 0 refills | Status: AC | PRN
  Filled 2024-01-02: qty 5, 1d supply, fill #0
  Filled 2024-01-02: qty 25, 9d supply, fill #0

## 2024-01-02 NOTE — Telephone Encounter (Signed)
**Note De-identified  [place] Obfuscation** Please advise 

## 2024-01-10 ENCOUNTER — Other Ambulatory Visit (HOSPITAL_COMMUNITY): Payer: Self-pay

## 2024-01-10 ENCOUNTER — Other Ambulatory Visit: Payer: Self-pay | Admitting: Internal Medicine

## 2024-01-10 DIAGNOSIS — E66813 Obesity, class 3: Secondary | ICD-10-CM

## 2024-01-10 MED ORDER — TIRZEPATIDE-WEIGHT MANAGEMENT 10 MG/0.5ML ~~LOC~~ SOAJ
10.0000 mg | SUBCUTANEOUS | 2 refills | Status: DC
Start: 2024-01-10 — End: 2024-05-14
  Filled 2024-01-10 – 2024-01-22 (×2): qty 2, 28d supply, fill #0
  Filled 2024-02-19 – 2024-05-13 (×5): qty 2, 28d supply, fill #1

## 2024-01-11 ENCOUNTER — Other Ambulatory Visit (HOSPITAL_COMMUNITY): Payer: Self-pay

## 2024-01-12 ENCOUNTER — Encounter: Payer: Self-pay | Admitting: Internal Medicine

## 2024-01-15 DIAGNOSIS — M6281 Muscle weakness (generalized): Secondary | ICD-10-CM | POA: Diagnosis not present

## 2024-01-15 DIAGNOSIS — N9412 Deep dyspareunia: Secondary | ICD-10-CM | POA: Diagnosis not present

## 2024-01-15 DIAGNOSIS — M51362 Other intervertebral disc degeneration, lumbar region with discogenic back pain and lower extremity pain: Secondary | ICD-10-CM | POA: Diagnosis not present

## 2024-01-15 DIAGNOSIS — N9411 Superficial (introital) dyspareunia: Secondary | ICD-10-CM | POA: Diagnosis not present

## 2024-01-16 ENCOUNTER — Telehealth

## 2024-01-18 ENCOUNTER — Other Ambulatory Visit: Payer: Self-pay

## 2024-01-18 ENCOUNTER — Emergency Department (HOSPITAL_BASED_OUTPATIENT_CLINIC_OR_DEPARTMENT_OTHER)
Admission: EM | Admit: 2024-01-18 | Discharge: 2024-01-18 | Disposition: A | Discharge status: Home / Self Care | Attending: Emergency Medicine | Admitting: Emergency Medicine

## 2024-01-18 ENCOUNTER — Encounter (HOSPITAL_BASED_OUTPATIENT_CLINIC_OR_DEPARTMENT_OTHER): Payer: Self-pay | Admitting: Emergency Medicine

## 2024-01-18 DIAGNOSIS — D72829 Elevated white blood cell count, unspecified: Secondary | ICD-10-CM | POA: Diagnosis not present

## 2024-01-18 DIAGNOSIS — R197 Diarrhea, unspecified: Secondary | ICD-10-CM | POA: Diagnosis not present

## 2024-01-18 DIAGNOSIS — R109 Unspecified abdominal pain: Secondary | ICD-10-CM | POA: Diagnosis not present

## 2024-01-18 LAB — COMPREHENSIVE METABOLIC PANEL WITH GFR
ALT: 11 U/L (ref 0–44)
AST: 17 U/L (ref 15–41)
Albumin: 4.7 g/dL (ref 3.5–5.0)
Alkaline Phosphatase: 104 U/L (ref 38–126)
Anion gap: 16 — ABNORMAL HIGH (ref 5–15)
BUN: 14 mg/dL (ref 6–20)
CO2: 17 mmol/L — ABNORMAL LOW (ref 22–32)
Calcium: 9.8 mg/dL (ref 8.9–10.3)
Chloride: 103 mmol/L (ref 98–111)
Creatinine, Ser: 0.92 mg/dL (ref 0.44–1.00)
GFR, Estimated: >60 mL/min (ref >60)
Glucose, Bld: 117 mg/dL — ABNORMAL HIGH (ref 70–99)
Potassium: 4.1 mmol/L (ref 3.5–5.1)
Sodium: 135 mmol/L (ref 135–145)
Total Bilirubin: 0.5 mg/dL (ref 0.0–1.2)
Total Protein: 8.6 g/dL — ABNORMAL HIGH (ref 6.5–8.1)

## 2024-01-18 LAB — CBC
HCT: 45.1 % (ref 36.0–46.0)
Hemoglobin: 14.6 g/dL (ref 12.0–15.0)
MCH: 26.4 pg (ref 26.0–34.0)
MCHC: 32.4 g/dL (ref 30.0–36.0)
MCV: 81.4 fL (ref 80.0–100.0)
Platelets: 481 10*3/uL — ABNORMAL HIGH (ref 150–400)
RBC: 5.54 MIL/uL — ABNORMAL HIGH (ref 3.87–5.11)
RDW: 15.2 % (ref 11.5–15.5)
WBC: 15.6 10*3/uL — ABNORMAL HIGH (ref 4.0–10.5)
nRBC: 0 % (ref 0.0–0.2)

## 2024-01-18 LAB — URINALYSIS, ROUTINE W REFLEX MICROSCOPIC
Bilirubin Urine: NEGATIVE
Glucose, UA: NEGATIVE mg/dL
Hgb urine dipstick: NEGATIVE
Ketones, ur: NEGATIVE mg/dL
Leukocytes,Ua: NEGATIVE
Nitrite: NEGATIVE
Protein, ur: NEGATIVE mg/dL
Specific Gravity, Urine: <=1.005 (ref 1.005–1.030)
pH: 5.5 (ref 5.0–8.0)

## 2024-01-18 LAB — LIPASE, BLOOD: Lipase: 10 U/L — ABNORMAL LOW (ref 11–51)

## 2024-01-18 LAB — HCG, SERUM, QUALITATIVE: Preg, Serum: NEGATIVE

## 2024-01-18 LAB — MAGNESIUM: Magnesium: 2.1 mg/dL (ref 1.7–2.4)

## 2024-01-18 MED ORDER — ONDANSETRON HCL 4 MG/2ML IJ SOLN
4.0000 mg | Freq: Once | INTRAMUSCULAR | Status: AC
  Administered 2024-01-18: 4 mg via INTRAVENOUS
  Filled 2024-01-18: qty 2

## 2024-01-18 MED ORDER — DIPHENOXYLATE-ATROPINE 2.5-0.025 MG PO TABS
1.0000 | ORAL_TABLET | Freq: Once | ORAL | Status: AC
  Administered 2024-01-18: 1 via ORAL
  Filled 2024-01-18: qty 1

## 2024-01-18 MED ORDER — SODIUM CHLORIDE 0.9 % IV BOLUS
1000.0000 mL | Freq: Once | INTRAVENOUS | Status: AC
  Administered 2024-01-18: 1000 mL via INTRAVENOUS

## 2024-01-18 MED ORDER — LOPERAMIDE HCL 2 MG PO CAPS
4.0000 mg | ORAL_CAPSULE | Freq: Once | ORAL | Status: AC
  Administered 2024-01-18: 4 mg via ORAL
  Filled 2024-01-18: qty 2

## 2024-01-18 MED ORDER — OXYCODONE-ACETAMINOPHEN 5-325 MG PO TABS
1.0000 | ORAL_TABLET | Freq: Once | ORAL | Status: AC
  Administered 2024-01-18: 1 via ORAL
  Filled 2024-01-18: qty 1

## 2024-01-18 MED ORDER — HYDROMORPHONE HCL 1 MG/ML IJ SOLN
0.5000 mg | Freq: Once | INTRAMUSCULAR | Status: DC
  Filled 2024-01-18: qty 1

## 2024-01-18 NOTE — ED Provider Notes (Signed)
 FU UA. Getting hydrated. Anticipate d/c Physical Exam  BP (!) 112/58   Pulse 90   Resp 18   SpO2 100%   Physical Exam  Procedures  Procedures  ED Course / MDM    Medical Decision Making Amount and/or Complexity of Data Reviewed Labs: ordered.  Risk Prescription drug management.    Urinalysis negative.  Patient has been rehydrated.  We discussed possible etiologies of diarrheal illness.  At this time plan will be for continued home hydration.  Patient requested something else for diarrhea.  1 dose of Lomotil given in the emergency department.  Patient counseled on using Imodium at home.  Anticipated course is improvement in resolving symptoms over the next 2 to 3 days.  Return precautions reviewed.      Wynetta Heckle, MD 01/18/24 [phone]

## 2024-01-18 NOTE — ED Notes (Signed)
 Dilaudid was ordered for pt, pt states she does not want anything for pain at this time

## 2024-01-18 NOTE — ED Notes (Signed)
 Pt reports she has had Nausea and diarrhea since yesterday.   States she can eat but goes straight thru her Rt. Lower quad pain tenderness

## 2024-01-18 NOTE — ED Provider Notes (Signed)
 [place] [facility] Provider Note   CSN: [id number] Arrival date & time: 01/18/24  0522     Patient presents with: Abdominal Pain and Diarrhea   Deborah Mcbride is a 38 y.o. female.   39 yo F with a chief complaints of diarrhea.  Started yesterday.  Has had about 30 bowel movements.  Nonbloody not dark.  No vomiting.  Able to eat and drink.  No suspicious food intake no recent international travel.     Abdominal Pain Associated symptoms: diarrhea   Diarrhea Associated symptoms: abdominal pain        Prior to Admission medications   Medication Sig Start Date End Date Taking? Authorizing Provider  acetaminophen  (TYLENOL ) 500 MG tablet Take 2 tablets (1,000 mg total) by mouth every 6 (six) hours as needed for mild pain (pain score 1-3) or moderate pain (pain score 4-6). 01/02/24   Gavin Kast, FNP  cetirizine  (ZYRTEC ) 10 MG tablet Take 1 tablet (10 mg total) by mouth daily. For itching 12/21/23   Fleming, Zelda W, NP  famotidine  (PEPCID ) 20 MG tablet Take 1 tablet (20 mg total) by mouth as needed for heartburn or indigestion. 08/11/23   Gavin Kast, FNP  famotidine  (PEPCID ) 40 MG tablet Take 1 tablet (40 mg total) by mouth daily. 07/18/23     Fremanezumab -vfrm (AJOVY ) 225 MG/1.5ML SOAJ Inject 1.5 mLs (225 mg) into the skin every 30 (thirty) days. 01/01/24   Gavin Kast, FNP  hydrocortisone  2.5 % cream Apply topically 2 (two) times daily. 12/21/23   Fleming, Zelda W, NP  ibuprofen  (ADVIL ) 800 MG tablet Take 1 tablet (800 mg total) by mouth every 8 (eight) hours as needed for mild pain (pain score 1-3) or moderate pain (pain score 4-6). 01/02/24   Gavin Kast, FNP  Iron , Ferrous Sulfate , 325 (65 Fe) MG TABS Take 1 tablet (325mg ) by mouth daily. 08/22/23   Gavin Kast, FNP  lidocaine  (XYLOCAINE ) 2 % solution TAKE 5 MLS BY MOUTH THREE TIMES DAILY AS NEEDED FOR PAIN. SWISH AND GARGLE. KEEP IN MOUTH FOR 30 SECONDS AND SPIT    [provider]   nitrofurantoin, macrocrystal-monohydrate, (MACROBID) 100 MG capsule Take 100 mg by mouth 2 (two) times daily. 11/07/23   [provider]  omeprazole  (PRILOSEC) 40 MG capsule Take 1 capsule (40 mg total) by mouth 2 (two) times daily. 11/27/23   Gavin Kast, FNP  ondansetron  (ZOFRAN ) 4 MG tablet Take 1 tablet (4 mg total) by mouth every 8 (eight) hours as needed for nausea or vomiting. 08/10/23   Kirk Peper A, DO  tirzepatide  (ZEPBOUND ) 10 MG/0.5ML Pen Inject 10 mg into the skin once a week. 01/10/24   Gavin Kast, FNP  triamcinolone  ointment (KENALOG ) 0.5 % Apply 1 Application topically 2 (two) times daily as needed (itching). 12/21/23   Gavin Kast, FNP  Ubrogepant  (UBRELVY ) 100 MG TABS Take 1 tablet by mouth at onset of migraine. May repeat dose in 2 hours if migraine persists. Max daily dose 2 tablets/day. 01/02/24   Gavin Kast, FNP  Vitamin D , Ergocalciferol , (DRISDOL ) 1.25 MG (50000 UNIT) CAPS capsule Take 1 capsule (50,000 Units total) by mouth every 7 (seven) days. 10/19/23   Gavin Kast, FNP    Allergies: Fentanyl , Hydromorphone, Dilaudid [hydromorphone hcl], and Morphine     Review of Systems  Gastrointestinal:  Positive for abdominal pain and diarrhea.    Updated Vital Signs BP (!) 112/58   Pulse 90   Resp 18   SpO2 100%  Physical Exam Vitals and nursing note reviewed.  Constitutional:      General: She is not in acute distress.    Appearance: She is well-developed. She is not diaphoretic.     Comments: BMI 57  HENT:     Head: Normocephalic and atraumatic.   Eyes:     Pupils: Pupils are equal, round, and reactive to light.    Cardiovascular:     Rate and Rhythm: Normal rate and regular rhythm.     Heart sounds: No murmur heard.    No friction rub. No gallop.  Pulmonary:     Effort: Pulmonary effort is normal.     Breath sounds: No wheezing or rales.  Abdominal:     General: There is no distension.     Palpations: Abdomen is soft.     Tenderness:  There is no abdominal tenderness.   Musculoskeletal:        General: No tenderness.     Cervical back: Normal range of motion and neck supple.   Skin:    General: Skin is warm and dry.   Neurological:     Mental Status: She is alert and oriented to person, place, and time.   Psychiatric:        Behavior: Behavior normal.     (all labs ordered are listed, but only abnormal results are displayed) Labs Reviewed  LIPASE, BLOOD - Abnormal; Notable for the following components:      Result Value   Lipase 10 (*)    All other components within normal limits  COMPREHENSIVE METABOLIC PANEL WITH GFR - Abnormal; Notable for the following components:   CO2 17 (*)    Glucose, Bld 117 (*)    Total Protein 8.6 (*)    Anion gap 16 (*)    All other components within normal limits  CBC - Abnormal; Notable for the following components:   WBC 15.6 (*)    RBC 5.54 (*)    Platelets 481 (*)    All other components within normal limits  MAGNESIUM  HCG, SERUM, QUALITATIVE  URINALYSIS, ROUTINE W REFLEX MICROSCOPIC    EKG: None  Radiology: No results found.   Procedures   Medications Ordered in the ED  HYDROmorphone (DILAUDID) injection 0.5 mg (0.5 mg Intravenous Not Given 01/18/24 0636)  sodium chloride  0.9 % bolus 1,000 mL (0 mLs Intravenous Stopped 01/18/24 0633)  ondansetron  (ZOFRAN ) injection 4 mg (4 mg Intravenous Given 01/18/24 0542)  loperamide (IMODIUM) capsule 4 mg (4 mg Oral Given 01/18/24 0541)  sodium chloride  0.9 % bolus 1,000 mL (1,000 mLs Intravenous New Bag/Given 01/18/24 6578)  oxyCODONE -acetaminophen  (PERCOCET/ROXICET) 5-325 MG per tablet 1 tablet (1 tablet Oral Given 01/18/24 0641)                                    Medical Decision Making Amount and/or Complexity of Data Reviewed Labs: ordered.  Risk Prescription drug management.   38 yo F with a chief complaints of diarrhea.  Going on since yesterday.  Abdominal cramping with it.  No known sick contacts, no  suspicious food intake.  Will give a bolus of IV fluids.  Check electrolytes.  Reassess.  Patient with listed allergy to all narcotics.  On reassessment the patient states that she is able to take Dilaudid it just makes her a bit itchy at times.  Lab work is resulted, metabolic acidosis with anion gap.  Likely  secondary to the diarrhea and likely dehydration.  Still has not been able to provide a urine sample.  Will give a second bag of IV fluids.  LFTs and lipase are unremarkable.  Mild leukocytosis.  Awaiting UA.  Patient cares signed out to Dr. Daivd Dub, please see their note for further details of care in the ED.  The patients results and plan were reviewed and discussed.   Any x-rays performed were independently reviewed by myself.   Differential diagnosis were considered with the presenting HPI.  Medications  HYDROmorphone (DILAUDID) injection 0.5 mg (0.5 mg Intravenous Not Given 01/18/24 0636)  sodium chloride  0.9 % bolus 1,000 mL (0 mLs Intravenous Stopped 01/18/24 0633)  ondansetron  (ZOFRAN ) injection 4 mg (4 mg Intravenous Given 01/18/24 0542)  loperamide (IMODIUM) capsule 4 mg (4 mg Oral Given 01/18/24 0541)  sodium chloride  0.9 % bolus 1,000 mL (1,000 mLs Intravenous New Bag/Given 01/18/24 0633)  oxyCODONE -acetaminophen  (PERCOCET/ROXICET) 5-325 MG per tablet 1 tablet (1 tablet Oral Given 01/18/24 0641)    Vitals:   01/18/24 0530 01/18/24 0533 01/18/24 0630  BP: (!) 158/81 (!) 158/81 (!) 112/58  Pulse: 97 96 90  Resp: 18 19 18   SpO2: 99% 100% 100%    Final diagnoses:  Diarrhea, unspecified type        Final diagnoses:  Diarrhea, unspecified type    ED Discharge Orders     None          Albertus Hughs, DO 01/18/24 0708

## 2024-01-18 NOTE — Discharge Instructions (Addendum)
 Take imodium for diarrhea.  Follow up with your family doc in the office.  Return to the emergency department if you are having fevers, worsening abdominal pain, cannot stay hydrated, are having bloody diarrhea or getting lightheaded and weak or other concerning changes.

## 2024-01-18 NOTE — ED Triage Notes (Signed)
 Pt reports right sided abd pain, Nausea and diarrhea since yesterday. Reports she has had diarrhea approx 30 times since yesterday. Unable to keep anything down.

## 2024-01-18 NOTE — ED Notes (Signed)
 Pt alert and oriented X 4 at the time of discharge. RR even and unlabored. No acute distress noted. Pt verbalized understanding of discharge instructions as discussed. Pt ambulatory to lobby at time of discharge.

## 2024-01-21 DIAGNOSIS — G4733 Obstructive sleep apnea (adult) (pediatric): Secondary | ICD-10-CM | POA: Diagnosis not present

## 2024-01-22 ENCOUNTER — Other Ambulatory Visit (HOSPITAL_COMMUNITY): Payer: Self-pay

## 2024-01-22 DIAGNOSIS — N939 Abnormal uterine and vaginal bleeding, unspecified: Secondary | ICD-10-CM | POA: Diagnosis not present

## 2024-01-22 DIAGNOSIS — Z6841 Body Mass Index (BMI) 40.0 and over, adult: Secondary | ICD-10-CM | POA: Diagnosis not present

## 2024-01-22 DIAGNOSIS — G8929 Other chronic pain: Secondary | ICD-10-CM | POA: Diagnosis not present

## 2024-01-22 DIAGNOSIS — M5441 Lumbago with sciatica, right side: Secondary | ICD-10-CM | POA: Diagnosis not present

## 2024-01-29 DIAGNOSIS — D251 Intramural leiomyoma of uterus: Secondary | ICD-10-CM | POA: Diagnosis not present

## 2024-01-29 DIAGNOSIS — G4733 Obstructive sleep apnea (adult) (pediatric): Secondary | ICD-10-CM | POA: Diagnosis not present

## 2024-02-05 ENCOUNTER — Encounter: Payer: Self-pay | Admitting: Internal Medicine

## 2024-02-05 ENCOUNTER — Ambulatory Visit (INDEPENDENT_AMBULATORY_CARE_PROVIDER_SITE_OTHER): Admitting: Internal Medicine

## 2024-02-05 VITALS — BP 136/78 | HR 84 | Temp 98.0°F | Ht 63.0 in | Wt 321.8 lb

## 2024-02-05 DIAGNOSIS — G43701 Chronic migraine without aura, not intractable, with status migrainosus: Secondary | ICD-10-CM

## 2024-02-05 DIAGNOSIS — I10 Essential (primary) hypertension: Secondary | ICD-10-CM | POA: Insufficient documentation

## 2024-02-05 MED ORDER — LISINOPRIL 2.5 MG PO TABS: 2.5000 mg | ORAL_TABLET | Freq: Every day | ORAL | 1 refills | Status: DC

## 2024-02-05 NOTE — Progress Notes (Signed)
 [facility] [address] [place] [address] Dept: [phone] Dept Fax: [phone]  Acute Care Office Visit  Subjective:   Deborah  D Mcbride 09/12/1985 02/05/2024  Chief Complaint  Patient presents with   Hypertension   Migraine    Constant and new Rx not working     HPI:  Discussed the use of AI scribe software for clinical note transcription with the patient, who gave verbal consent to proceed.  History of Present Illness   Deborah Mcbride is a 38 year old female who presents with elevated blood pressure and chronic migraines.  She has been experiencing elevated blood pressure readings, initially noted during a visit to her OB (140/84). At home, her blood pressure has been recorded as high as 160/84 mmHg, and has been getting readings consistently in 140's systolic. She experiences facial flushing and dizziness when her blood pressure is elevated. She has not been on blood pressure medication before, but there is a family history of hypertension.  She describes chronic migraines that are severe enough to cause her to become quiet, a change her husband notices. She has tried various preventative therapies without relief. She was recently started on Ajovy  1 month ago. She has only taken 1 dose but notes no improvement within migraines. Ubrelvy  is used to manage acute episodes, allowing her to sleep but reports no significant relief of migraines. She has not tried Botox for her migraines and does not recall seeing a neurologist in the past.  She is actively trying to lose weight by monitoring her diet, but feels her efforts have not resulted in significant weight loss.  She is currently on Zepbound  10mg  weekly.      BP Readings from Last 3 Encounters:  02/05/24 136/78  01/18/24 (!) 112/58  12/19/23 128/68   Wt Readings from Last 3 Encounters:  02/05/24 (!) 321 lb 12.8 oz (146 kg)  12/19/23 (!) 324 lb 3.2 oz (147.1 kg)   11/14/23 (!) 321 lb 6.4 oz (145.8 kg)      The following portions of the patient's history were reviewed and updated as appropriate: past medical history, past surgical history, family history, social history, allergies, medications, and problem list.   Patient Active Problem List   Diagnosis Date Noted   Elevated LDL cholesterol level 10/12/2023   Precordial pain 10/11/2023   Chronic migraine without aura with status migrainosus, not intractable 09/05/2023   Iron  deficiency anemia 08/01/2023   Insulin  resistance 07/31/2023   Hypertriglyceridemia 07/31/2023   Vitamin D  deficiency 07/31/2023   Hepatosplenomegaly 04/10/2023   Nasal congestion 01/19/2023   Sinus tachycardia 12/07/2022   OSA (obstructive sleep apnea) 10/21/2022   BMI 50.0-59.9, adult (HCC) 08/03/2022   Morbid obesity (HCC) 07/07/2022   OSA treated with BiPAP 07/07/2022   Gastroesophageal reflux disease without esophagitis 07/07/2022   Past Medical History:  Diagnosis Date   Anemia    Back pain    GERD (gastroesophageal reflux disease)    Joint pain    Obesity    Sleep apnea    Vitamin D  deficiency    Past Surgical History:  Procedure Laterality Date   CESAREAN SECTION     CHOLECYSTECTOMY     TONSILLECTOMY     TUBAL LIGATION     Family History  Problem Relation Age of Onset   Diabetes Father    High blood pressure Father    Sleep apnea Father    Obesity Father     Current Outpatient Medications:  acetaminophen  (TYLENOL ) 500 MG tablet, Take 2 tablets (1,000 mg total) by mouth every 6 (six) hours as needed for mild pain (pain score 1-3) or moderate pain (pain score 4-6)., Disp: 100 tablet, Rfl: 0   cetirizine  (ZYRTEC ) 10 MG tablet, Take 1 tablet (10 mg total) by mouth daily. For itching, Disp: 30 tablet, Rfl: 0   famotidine  (PEPCID ) 20 MG tablet, Take 1 tablet (20 mg total) by mouth as needed for heartburn or indigestion., Disp: 90 tablet, Rfl: 1   famotidine  (PEPCID ) 40 MG tablet, Take 1 tablet (40  mg total) by mouth daily., Disp: 90 tablet, Rfl: 1   Fremanezumab -vfrm (AJOVY ) 225 MG/1.5ML SOAJ, Inject 1.5 mLs (225 mg) into the skin every 30 (thirty) days., Disp: 1.5 mL, Rfl: 3   hydrocortisone  2.5 % cream, Apply topically 2 (two) times daily., Disp: 30 g, Rfl: 0   ibuprofen  (ADVIL ) 800 MG tablet, Take 1 tablet (800 mg total) by mouth every 8 (eight) hours as needed for mild pain (pain score 1-3) or moderate pain (pain score 4-6)., Disp: 30 tablet, Rfl: 0   Iron , Ferrous Sulfate , 325 (65 Fe) MG TABS, Take 1 tablet (325mg ) by mouth daily., Disp: 100 tablet, Rfl: 1   lidocaine  (XYLOCAINE ) 2 % solution, TAKE 5 MLS BY MOUTH THREE TIMES DAILY AS NEEDED FOR PAIN. SWISH AND GARGLE. KEEP IN MOUTH FOR 30 SECONDS AND SPIT, Disp: , Rfl:    lisinopril  (ZESTRIL ) 2.5 MG tablet, Take 1 tablet (2.5 mg total) by mouth daily., Disp: 90 tablet, Rfl: 1   omeprazole  (PRILOSEC) 40 MG capsule, Take 1 capsule (40 mg total) by mouth 2 (two) times daily., Disp: 60 capsule, Rfl: 1   ondansetron  (ZOFRAN ) 4 MG tablet, Take 1 tablet (4 mg total) by mouth every 8 (eight) hours as needed for nausea or vomiting., Disp: 20 tablet, Rfl: 0   tirzepatide  (ZEPBOUND ) 10 MG/0.5ML Pen, Inject 10 mg into the skin once a week., Disp: 2 mL, Rfl: 2   triamcinolone  ointment (KENALOG ) 0.5 %, Apply 1 Application topically 2 (two) times daily as needed (itching)., Disp: 30 g, Rfl: 1   Ubrogepant  (UBRELVY ) 100 MG TABS, Take 1 tablet by mouth at onset of migraine. May repeat dose in 2 hours if migraine persists. Max daily dose 2 tablets/day., Disp: 16 tablet, Rfl: 2   Vitamin D , Ergocalciferol , (DRISDOL ) 1.25 MG (50000 UNIT) CAPS capsule, Take 1 capsule (50,000 Units total) by mouth every 7 (seven) days., Disp: 12 capsule, Rfl: 1   nitrofurantoin, macrocrystal-monohydrate, (MACROBID) 100 MG capsule, Take 100 mg by mouth 2 (two) times daily. (Patient not taking: Reported on 02/05/2024), Disp: , Rfl:  Allergies  Allergen Reactions   Fentanyl   Shortness Of Breath, Itching and Nausea And Vomiting    rash   Hydromorphone  Itching   Dilaudid  [Hydromorphone  Hcl] Itching   Morphine  Other (See Comments)     ROS: A complete ROS was performed with pertinent positives/negatives noted in the HPI. The remainder of the ROS are negative.    Objective:   Today's Vitals   02/05/24 1502  BP: 136/78  Pulse: 84  Temp: 98 F (36.7 C)  TempSrc: Temporal  SpO2: 98%  Weight: (!) 321 lb 12.8 oz (146 kg)  Height: 5' 3 (1.6 m)    GENERAL: Well-appearing, in NAD. Well nourished.  SKIN: Pink, warm and dry.  RESPIRATORY: Chest wall symmetrical. Respirations even and non-labored.  EXTREMITIES: Without clubbing, cyanosis, or edema.  NEUROLOGIC: No motor or sensory deficits. Steady, even gait.  PSYCH/MENTAL  STATUS: Alert, oriented x 3. Cooperative, appropriate mood and affect.    No results found for any visits on 02/05/24.    Assessment & Plan:  Assessment and Plan    Hypertension Intermittent elevated blood pressure, possibly further increased by by pain and stress. Discussed side effects and importance of home monitoring. - Prescribe lisinopril  2.5 mg once daily. - Instruct to monitor blood pressure at home. - Schedule follow-up in two weeks for blood pressure check, titrate medication based up on readings.  Chronic Migraines Chronic migraines with limited relief from current treatments. Has tried Topamax, Metoprolol, Amitriptyline in the past with no relief. Ajovy  currently as insurance would not approve Aimovig . Insurance denied Qulipta . No relief with Imitrex.  Neurology referral needed for further options. - Refer to neurology for further evaluation and management. - Continue Ajovy  at this time as she has only taken 1 dose  Weight Management Weight loss efforts ongoing, may impact blood pressure control. Encouraged continued efforts. - Encourage continued weight loss efforts.      Meds ordered this encounter  Medications    lisinopril  (ZESTRIL ) 2.5 MG tablet    Sig: Take 1 tablet (2.5 mg total) by mouth daily.    Dispense:  90 tablet    Refill:  1    Supervising Provider:   THOMPSON, AARON B [[id number]]   Orders Placed This Encounter  Procedures   Ambulatory referral to Neurology    Referral Priority:   Routine    Referral Type:   Consultation    Referral Reason:   Specialty Services Required    Requested Specialty:   Neurology    Number of Visits Requested:   1   Lab Orders  No laboratory test(s) ordered today   No images are attached to the encounter or orders placed in the encounter.  Return in about 2 weeks (around 02/19/2024) for Blood Pressure re-check.   Rosina Senters, FNP

## 2024-02-09 ENCOUNTER — Encounter: Payer: Self-pay | Admitting: Neurology

## 2024-02-19 ENCOUNTER — Other Ambulatory Visit (HOSPITAL_COMMUNITY): Payer: Self-pay

## 2024-02-21 ENCOUNTER — Ambulatory Visit (INDEPENDENT_AMBULATORY_CARE_PROVIDER_SITE_OTHER): Admitting: Internal Medicine

## 2024-02-21 VITALS — BP 134/70 | HR 89 | Temp 98.4°F | Ht 63.0 in | Wt 323.8 lb

## 2024-02-21 DIAGNOSIS — G8929 Other chronic pain: Secondary | ICD-10-CM | POA: Diagnosis not present

## 2024-02-21 DIAGNOSIS — M545 Low back pain, unspecified: Secondary | ICD-10-CM | POA: Diagnosis not present

## 2024-02-21 DIAGNOSIS — I10 Essential (primary) hypertension: Secondary | ICD-10-CM | POA: Diagnosis not present

## 2024-02-21 MED ORDER — OXYCODONE-ACETAMINOPHEN 5-325 MG PO TABS: 1.0000 | ORAL_TABLET | ORAL | 0 refills | Status: DC | PRN

## 2024-02-21 MED ORDER — LISINOPRIL 2.5 MG PO TABS: 5.0000 mg | ORAL_TABLET | Freq: Every day | ORAL | Status: AC

## 2024-02-21 NOTE — Progress Notes (Unsigned)
 [facility] PRIMARY CARE [facility] [address] [place] [address] Dept: [phone] Dept Fax: [phone]  Acute Care Office Visit  Subjective:   Deborah  D Mcbride 08-Feb-1986 02/21/2024  Chief Complaint  Patient presents with   Follow-up    B/p     HPI: Discussed the use of AI scribe software for clinical note transcription with the patient, who gave verbal consent to proceed.  History of Present Illness   Deborah  D Mcbride is a 38 year old female with hypertension who presents for follow-up on elevated blood pressure.  She has been monitoring her blood pressure, noting consistent systolic readings above 128 mmHg, while diastolic readings remain below 90 mmHg. She is currently taking lisinopril  2.5 mg daily without significant side effects such as cough, but her blood pressure has not decreased since starting the medication. She experiences headaches associated with higher blood pressure readings. She monitors her blood pressure throughout the day and notes that it remains consistently elevated.  She reports an increase in back pain, described as severe enough to bring her to tears. She previously visited urgent care and was prescribed Percocet, which she used last night to alleviate the pain enough to sleep. She believes the pain may be contributing to her elevated blood pressure. She missed an appointment with a pain management clinic due to family emergencies but has rescheduled for next month. She inquires about evening pain relief options, as she avoids taking medications like Tylenol  or ibuprofen  unless necessary.  She describes discoloration in her legs, noting that the back of her legs turns purplish-red, particularly when her back pain is severe. She recalls that her previous primary care provider mentioned possible circulation issues. There is a family history of varicose veins, as her aunt has them.  She is currently on a weight management  medication, Zepbound , at a dose of 10 mg, but has stopped due to a plateau in weight loss. She mentions dietary habits of eating once a day, occasionally having lunch, and rarely eating breakfast.       Home BP readings:   BP Readings from Last 3 Encounters:  02/21/24 (!) 140/70  02/05/24 136/78  01/18/24 (!) 112/58    The following portions of the patient's history were reviewed and updated as appropriate: past medical history, past surgical history, family history, social history, allergies, medications, and problem list.   Patient Active Problem List   Diagnosis Date Noted   Hypertension 02/05/2024   Elevated LDL cholesterol level 10/12/2023   Precordial pain 10/11/2023   Chronic migraine without aura with status migrainosus, not intractable 09/05/2023   Iron  deficiency anemia 08/01/2023   Insulin  resistance 07/31/2023   Hypertriglyceridemia 07/31/2023   Vitamin D  deficiency 07/31/2023   Hepatosplenomegaly 04/10/2023   Nasal congestion 01/19/2023   Sinus tachycardia 12/07/2022   OSA (obstructive sleep apnea) 10/21/2022   BMI 50.0-59.9, adult (HCC) 08/03/2022   Morbid obesity (HCC) 07/07/2022   OSA treated with BiPAP 07/07/2022   Gastroesophageal reflux disease without esophagitis 07/07/2022   Past Medical History:  Diagnosis Date   Anemia    Back pain    GERD (gastroesophageal reflux disease)    Joint pain    Obesity    Sleep apnea    Vitamin D  deficiency    Past Surgical History:  Procedure Laterality Date   CESAREAN SECTION     CHOLECYSTECTOMY     TONSILLECTOMY     TUBAL LIGATION     Family History  Problem Relation Age of  Onset   Diabetes Father    High blood pressure Father    Sleep apnea Father    Obesity Father     Current Outpatient Medications:    acetaminophen  (TYLENOL ) 500 MG tablet, Take 2 tablets (1,000 mg total) by mouth every 6 (six) hours as needed for mild pain (pain score 1-3) or moderate pain (pain score 4-6)., Disp: 100 tablet, Rfl: 0    cetirizine  (ZYRTEC ) 10 MG tablet, Take 1 tablet (10 mg total) by mouth daily. For itching, Disp: 30 tablet, Rfl: 0   famotidine  (PEPCID ) 20 MG tablet, Take 1 tablet (20 mg total) by mouth as needed for heartburn or indigestion., Disp: 90 tablet, Rfl: 1   famotidine  (PEPCID ) 40 MG tablet, Take 1 tablet (40 mg total) by mouth daily., Disp: 90 tablet, Rfl: 1   Fremanezumab -vfrm (AJOVY ) 225 MG/1.5ML SOAJ, Inject 1.5 mLs (225 mg) into the skin every 30 (thirty) days., Disp: 1.5 mL, Rfl: 3   hydrocortisone  2.5 % cream, Apply topically 2 (two) times daily., Disp: 30 g, Rfl: 0   ibuprofen  (ADVIL ) 800 MG tablet, Take 1 tablet (800 mg total) by mouth every 8 (eight) hours as needed for mild pain (pain score 1-3) or moderate pain (pain score 4-6)., Disp: 30 tablet, Rfl: 0   Iron , Ferrous Sulfate , 325 (65 Fe) MG TABS, Take 1 tablet (325mg ) by mouth daily., Disp: 100 tablet, Rfl: 1   lidocaine  (XYLOCAINE ) 2 % solution, TAKE 5 MLS BY MOUTH THREE TIMES DAILY AS NEEDED FOR PAIN. SWISH AND GARGLE. KEEP IN MOUTH FOR 30 SECONDS AND SPIT, Disp: , Rfl:    nitrofurantoin, macrocrystal-monohydrate, (MACROBID) 100 MG capsule, Take 100 mg by mouth 2 (two) times daily., Disp: , Rfl:    omeprazole  (PRILOSEC) 40 MG capsule, Take 1 capsule (40 mg total) by mouth 2 (two) times daily., Disp: 60 capsule, Rfl: 1   ondansetron  (ZOFRAN ) 4 MG tablet, Take 1 tablet (4 mg total) by mouth every 8 (eight) hours as needed for nausea or vomiting., Disp: 20 tablet, Rfl: 0   oxyCODONE -acetaminophen  (PERCOCET/ROXICET) 5-325 MG tablet, Take 1 tablet by mouth every 4 (four) hours as needed for severe pain (pain score 7-10)., Disp: 24 tablet, Rfl: 0   tirzepatide  (ZEPBOUND ) 10 MG/0.5ML Pen, Inject 10 mg into the skin once a week., Disp: 2 mL, Rfl: 2   triamcinolone  ointment (KENALOG ) 0.5 %, Apply 1 Application topically 2 (two) times daily as needed (itching)., Disp: 30 g, Rfl: 1   Ubrogepant  (UBRELVY ) 100 MG TABS, Take 1 tablet by mouth at onset  of migraine. May repeat dose in 2 hours if migraine persists. Max daily dose 2 tablets/day., Disp: 16 tablet, Rfl: 2   Vitamin D , Ergocalciferol , (DRISDOL ) 1.25 MG (50000 UNIT) CAPS capsule, Take 1 capsule (50,000 Units total) by mouth every 7 (seven) days., Disp: 12 capsule, Rfl: 1   lisinopril  (ZESTRIL ) 2.5 MG tablet, Take 2 tablets (5 mg total) by mouth daily., Disp: , Rfl:  Allergies  Allergen Reactions   Fentanyl  Shortness Of Breath, Itching and Nausea And Vomiting    rash   Hydromorphone  Itching   Dilaudid  [Hydromorphone  Hcl] Itching   Morphine  Other (See Comments)     ROS: A complete ROS was performed with pertinent positives/negatives noted in the HPI. The remainder of the ROS are negative.    Objective:   Today's Vitals   02/21/24 1359  BP: (!) 140/70  Pulse: 89  Temp: 98.4 F (36.9 C)  TempSrc: Temporal  SpO2: 98%  Weight: ROLLEN)  323 lb 12.8 oz (146.9 kg)  Height: 5' 3 (1.6 m)    GENERAL: Well-appearing, in NAD. Well nourished.  SKIN: Pink, warm and dry. No rash, lesion, ulceration, or ecchymoses.  NECK: Trachea midline. Full ROM w/o pain or tenderness. No lymphadenopathy.  RESPIRATORY: Chest wall symmetrical. Respirations even and non-labored. Breath sounds clear to auscultation bilaterally.  CARDIAC: S1, S2 present, regular rate and rhythm. Peripheral pulses 2+ bilaterally.  EXTREMITIES: Without clubbing, cyanosis, or edema.  NEUROLOGIC: No motor or sensory deficits. Steady, even gait.  PSYCH/MENTAL STATUS: Alert, oriented x 3. Cooperative, appropriate mood and affect.    No results found for any visits on 02/21/24.    Assessment & Plan:  Assessment and Plan    Hypertension Blood pressure remains elevated despite lisinopril  2.5 mg. Pain and stress may contribute. Conservative approach to avoid hypotension. - Increase lisinopril  to 5 mg once daily. - Monitor blood pressure regularly. - Instruct her to call for a refill of 5 mg tablets when current supply runs  low.  Chronic Back Pain Pain severity increased, affecting daily activities. Limited OTC options. Percocet prescribed for severe evening pain. Upcoming pain management clinic appointment. - Prescribe Percocet for pain management in the evening. - Attend rescheduled pain management clinic appointment in August or September.  Venous Insufficiency Leg discoloration suggests circulation issues. Symptoms not significantly discomforting unless with back pain. Family history of venous issues. - Recommend use of knee-high compression stockings to improve circulation and reduce swelling. - Monitor for worsening symptoms and consider referral to a vascular specialist if symptoms become severe.  Obesity Weight plateau on Zepbound  10 mg. Phentermine not suitable due to hypertension. - Continue current dose of Zepbound  10 mg. - Avoid use of phentermine due to hypertension.       Meds ordered this encounter  Medications   lisinopril  (ZESTRIL ) 2.5 MG tablet    Sig: Take 2 tablets (5 mg total) by mouth daily.    Supervising Provider:   THOMPSON, AARON B [[id number]]   oxyCODONE -acetaminophen  (PERCOCET/ROXICET) 5-325 MG tablet    Sig: Take 1 tablet by mouth every 4 (four) hours as needed for severe pain (pain score 7-10).    Dispense:  24 tablet    Refill:  0    Supervising Provider:   SEBASTIAN BEVERLEY NOVAK [[id number]]   No orders of the defined types were placed in this encounter.  Lab Orders  No laboratory test(s) ordered today   No images are attached to the encounter or orders placed in the encounter.  No follow-ups on file.   Rosina Senters, FNP

## 2024-02-22 DIAGNOSIS — I10 Essential (primary) hypertension: Secondary | ICD-10-CM | POA: Diagnosis not present

## 2024-02-28 ENCOUNTER — Other Ambulatory Visit (HOSPITAL_COMMUNITY): Payer: Self-pay

## 2024-02-28 DIAGNOSIS — G4733 Obstructive sleep apnea (adult) (pediatric): Secondary | ICD-10-CM | POA: Diagnosis not present

## 2024-03-04 ENCOUNTER — Other Ambulatory Visit: Payer: Self-pay

## 2024-03-04 ENCOUNTER — Other Ambulatory Visit: Payer: Self-pay | Admitting: Internal Medicine

## 2024-03-04 ENCOUNTER — Other Ambulatory Visit (HOSPITAL_COMMUNITY): Payer: Self-pay

## 2024-03-04 DIAGNOSIS — K219 Gastro-esophageal reflux disease without esophagitis: Secondary | ICD-10-CM

## 2024-03-04 MED ORDER — OMEPRAZOLE 40 MG PO CPDR
40.0000 mg | DELAYED_RELEASE_CAPSULE | Freq: Two times a day (BID) | ORAL | 1 refills | Status: DC
  Filled 2024-03-04: qty 60, 30d supply, fill #0
  Filled 2024-06-06: qty 60, 30d supply, fill #1

## 2024-03-05 ENCOUNTER — Other Ambulatory Visit (HOSPITAL_COMMUNITY): Payer: Self-pay

## 2024-03-06 ENCOUNTER — Other Ambulatory Visit (HOSPITAL_COMMUNITY): Payer: Self-pay

## 2024-03-18 ENCOUNTER — Other Ambulatory Visit (HOSPITAL_COMMUNITY): Payer: Self-pay

## 2024-03-18 ENCOUNTER — Telehealth: Payer: Self-pay

## 2024-03-18 NOTE — Telephone Encounter (Signed)
 Pharmacy Patient Advocate Encounter   Received notification from Onbase that prior authorization for Zepbound  10MG /0.5ML pen-injectors  is required/requested.   Insurance verification completed.   The patient is insured through [facility] .   Per test claim: PA required; PA submitted to above mentioned insurance via Latent Key/confirmation #/EOC [id number] Status is pending

## 2024-03-20 ENCOUNTER — Other Ambulatory Visit (HOSPITAL_COMMUNITY): Payer: Self-pay

## 2024-03-20 ENCOUNTER — Encounter: Payer: Self-pay | Admitting: Internal Medicine

## 2024-03-20 DIAGNOSIS — G8929 Other chronic pain: Secondary | ICD-10-CM

## 2024-03-20 NOTE — Telephone Encounter (Signed)
 Pharmacy Patient Advocate Encounter  Received notification from [facility] that Prior Authorization for Zepbound  10MG /0.5ML pen-injectors  has been APPROVED from 03/18/24 to 03/18/25. Ran test claim, Copay is $0. This test claim was processed through [facility]- copay amounts may vary at other pharmacies due to pharmacy/plan contracts, or as the patient moves through the different stages of their insurance plan.   PA #/Case ID/Reference #: [id number]

## 2024-03-22 ENCOUNTER — Other Ambulatory Visit: Payer: Self-pay | Admitting: Internal Medicine

## 2024-03-22 ENCOUNTER — Other Ambulatory Visit (HOSPITAL_COMMUNITY): Payer: Self-pay

## 2024-03-22 MED ORDER — OXYCODONE-ACETAMINOPHEN 5-325 MG PO TABS: 1.0000 | ORAL_TABLET | ORAL | 0 refills | Status: AC | PRN

## 2024-03-25 ENCOUNTER — Other Ambulatory Visit (HOSPITAL_COMMUNITY): Payer: Self-pay

## 2024-03-25 ENCOUNTER — Encounter (HOSPITAL_COMMUNITY): Payer: Self-pay

## 2024-03-27 ENCOUNTER — Other Ambulatory Visit (HOSPITAL_COMMUNITY): Payer: Self-pay

## 2024-03-27 MED ORDER — ZEPBOUND 2.5 MG/0.5ML ~~LOC~~ SOAJ
2.5000 mg | SUBCUTANEOUS | 0 refills | Status: DC
  Filled 2024-03-27: qty 2, 28d supply, fill #0

## 2024-03-27 NOTE — Addendum Note (Signed)
 Addended by: BILLY KNEE on: 03/27/2024 11:52 AM   Modules accepted: Orders

## 2024-03-28 DIAGNOSIS — G4733 Obstructive sleep apnea (adult) (pediatric): Secondary | ICD-10-CM | POA: Diagnosis not present

## 2024-03-30 DIAGNOSIS — G4733 Obstructive sleep apnea (adult) (pediatric): Secondary | ICD-10-CM | POA: Diagnosis not present

## 2024-03-31 DIAGNOSIS — G4733 Obstructive sleep apnea (adult) (pediatric): Secondary | ICD-10-CM | POA: Diagnosis not present

## 2024-04-03 ENCOUNTER — Other Ambulatory Visit (HOSPITAL_COMMUNITY): Payer: Self-pay

## 2024-04-18 ENCOUNTER — Ambulatory Visit (INDEPENDENT_AMBULATORY_CARE_PROVIDER_SITE_OTHER): Admitting: Internal Medicine

## 2024-04-18 ENCOUNTER — Encounter: Payer: Self-pay | Admitting: Internal Medicine

## 2024-04-18 ENCOUNTER — Ambulatory Visit: Payer: Self-pay | Admitting: Internal Medicine

## 2024-04-18 VITALS — BP 122/70 | HR 83 | Temp 97.8°F | Ht 64.0 in | Wt 330.8 lb

## 2024-04-18 DIAGNOSIS — M545 Low back pain, unspecified: Secondary | ICD-10-CM | POA: Diagnosis not present

## 2024-04-18 DIAGNOSIS — Z0000 Encounter for general adult medical examination without abnormal findings: Secondary | ICD-10-CM

## 2024-04-18 DIAGNOSIS — G8929 Other chronic pain: Secondary | ICD-10-CM

## 2024-04-18 DIAGNOSIS — G4733 Obstructive sleep apnea (adult) (pediatric): Secondary | ICD-10-CM | POA: Diagnosis not present

## 2024-04-18 LAB — COMPREHENSIVE METABOLIC PANEL WITH GFR
ALT: 11 U/L (ref 0–35)
AST: 13 U/L (ref 0–37)
Albumin: 4.1 g/dL (ref 3.5–5.2)
Alkaline Phosphatase: 65 U/L (ref 39–117)
BUN: 10 mg/dL (ref 6–23)
CO2: 26 meq/L (ref 19–32)
Calcium: 9.3 mg/dL (ref 8.4–10.5)
Chloride: 101 meq/L (ref 96–112)
Creatinine, Ser: 0.76 mg/dL (ref 0.40–1.20)
GFR: 100.77 mL/min (ref >60.00)
Glucose, Bld: 84 mg/dL (ref 70–99)
Potassium: 3.9 meq/L (ref 3.5–5.1)
Sodium: 136 meq/L (ref 135–145)
Total Bilirubin: 0.3 mg/dL (ref 0.2–1.2)
Total Protein: 7.1 g/dL (ref 6.0–8.3)

## 2024-04-18 LAB — LIPID PANEL
Cholesterol: 147 mg/dL (ref 0–200)
HDL: 40.9 mg/dL (ref >39.00)
LDL Cholesterol: 68 mg/dL (ref 0–99)
NonHDL: 105.85
Total CHOL/HDL Ratio: 4
Triglycerides: 188 mg/dL — ABNORMAL HIGH (ref 0.0–149.0)
VLDL: 37.6 mg/dL (ref 0.0–40.0)

## 2024-04-18 LAB — TSH: TSH: 1.9 u[IU]/mL (ref 0.35–5.50)

## 2024-04-18 LAB — CBC WITH DIFFERENTIAL/PLATELET
Basophils Absolute: 0 K/uL (ref 0.0–0.1)
Basophils Relative: 0.6 % (ref 0.0–3.0)
Eosinophils Absolute: 0.1 K/uL (ref 0.0–0.7)
Eosinophils Relative: 1.6 % (ref 0.0–5.0)
HCT: 39.1 % (ref 36.0–46.0)
Hemoglobin: 12.6 g/dL (ref 12.0–15.0)
Lymphocytes Relative: 22 % (ref 12.0–46.0)
Lymphs Abs: 1.9 K/uL (ref 0.7–4.0)
MCHC: 32.2 g/dL (ref 30.0–36.0)
MCV: 78.8 fl (ref 78.0–100.0)
Monocytes Absolute: 0.6 K/uL (ref 0.1–1.0)
Monocytes Relative: 6.6 % (ref 3.0–12.0)
Neutro Abs: 5.9 K/uL (ref 1.4–7.7)
Neutrophils Relative %: 69.2 % (ref 43.0–77.0)
Platelets: 380 K/uL (ref 150.0–400.0)
RBC: 4.97 Mil/uL (ref 3.87–5.11)
RDW: 15.3 % (ref 11.5–15.5)
WBC: 8.5 K/uL (ref 4.0–10.5)

## 2024-04-18 NOTE — Progress Notes (Signed)
 Subjective:   Deborah Mcbride February 16, 1986  04/18/2024   CC: Chief Complaint  Patient presents with   Annual Exam    Pt is fasting.  For a week numbness in right arm elbow down to wrist,concerns of being in premenopausal.     HPI: Deborah Mcbride is a 38 y.o. female who presents for a routine health maintenance exam.  Labs  collected at time of visit.   Patient reports chronic back pain, now having pain radiating down her right lower extremity and right arm.  Pain is waking her up in the middle the night.  Imaging in April 2025 did show mild degenerative changes in the thoracolumbar spine.  Patient has been advised to follow-up with orthopedics for further evaluation and imaging, but she has not done so yet.  Referral was placed in April 2025.    HEALTH SCREENINGS: - Pap smear: up to date -Patient is considering a hysterectomy as she has history of ovarian cysts and dysmenorrhea.  She has been consulting with OB/GYN, but surgery has been postponed as they would like her to lose weight before surgery. - Mammogram (40+): Not applicable  - Colonoscopy (45+): Not applicable  - Bone Density (65+): Not applicable  - Lung CA screening with low-dose CT:  Not applicable Adults age 36-80 who are current cigarette smokers or quit within the last 15 years. Must have 20 pack year history.   IMMUNIZATIONS: - Tdap: Tetanus vaccination status reviewed: declined. - Influenza: declined   Past medical history, surgical history, medications, allergies, family history and social history reviewed with patient today and changes made to appropriate areas of the chart.   Social History   Socioeconomic History   Marital status: Married    Spouse name: Not on file   Number of children: Not on file   Years of education: Not on file   Highest education level: 12th grade  Occupational History   Not on file  Tobacco Use   Smoking status: Never   Smokeless tobacco: Never  Vaping Use   Vaping  status: Never Used  Substance and Sexual Activity   Alcohol use: No   Drug use: No   Sexual activity: Not on file  Other Topics Concern   Not on file  Social History Narrative   Not on file   Social Drivers of Health   Financial Resource Strain: Low Risk  (02/21/2024)   Overall Financial Resource Strain (CARDIA)    Difficulty of Paying Living Expenses: Not hard at all  Food Insecurity: No Food Insecurity (02/21/2024)   Hunger Vital Sign    Worried About Running Out of Food in the Last Year: Never true    Ran Out of Food in the Last Year: Never true  Transportation Needs: No Transportation Needs (02/21/2024)   PRAPARE - Administrator, Civil Service (Medical): No    Lack of Transportation (Non-Medical): No  Physical Activity: Unknown (02/21/2024)   Exercise Vital Sign    Days of Exercise per Week: Patient declined    Minutes of Exercise per Session: Not on file  Stress: Patient Declined (02/21/2024)   [employer] of Occupational Health - Occupational Stress Questionnaire    Feeling of Stress: Patient declined  Social Connections: Socially Integrated (02/21/2024)   Social Connection and Isolation Panel    Frequency of Communication with Friends and Family: More than three times a week    Frequency of Social Gatherings with Friends and Family: More than three times a  week    Attends Religious Services: More than 4 times per year    Active Member of Clubs or Organizations: Yes    Attends Banker Meetings: More than 4 times per year    Marital Status: Married  Catering manager Violence: Not At Risk (12/10/2022)   Received from [facility]   HITS    Over the last 12 months how often did your partner physically hurt you?: Never    Over the last 12 months how often did your partner insult you or talk down to you?: Never    Over the last 12 months how often did your partner threaten you with physical harm?: Never    Over the last 12 months how often did  your partner scream or curse at you?: Never     Past Medical History:  Diagnosis Date   Anemia    Back pain    GERD (gastroesophageal reflux disease)    Joint pain    Obesity    Sleep apnea    Vitamin D  deficiency     Past Surgical History:  Procedure Laterality Date   CESAREAN SECTION     CHOLECYSTECTOMY     TONSILLECTOMY     TUBAL LIGATION      Current Outpatient Medications on File Prior to Visit  Medication Sig   acetaminophen  (TYLENOL ) 500 MG tablet Take 2 tablets (1,000 mg total) by mouth every 6 (six) hours as needed for mild pain (pain score 1-3) or moderate pain (pain score 4-6).   famotidine  (PEPCID ) 40 MG tablet Take 1 tablet (40 mg total) by mouth daily. (Patient taking differently: Take 40 mg by mouth as needed.)   hydrocortisone  2.5 % cream Apply topically 2 (two) times daily.   ibuprofen  (ADVIL ) 800 MG tablet Take 1 tablet (800 mg total) by mouth every 8 (eight) hours as needed for mild pain (pain score 1-3) or moderate pain (pain score 4-6).   lidocaine  (XYLOCAINE ) 2 % solution TAKE 5 MLS BY MOUTH THREE TIMES DAILY AS NEEDED FOR PAIN. SWISH AND GARGLE. KEEP IN MOUTH FOR 30 SECONDS AND SPIT (Patient taking differently: as needed.)   lisinopril  (ZESTRIL ) 2.5 MG tablet Take 2 tablets (5 mg total) by mouth daily. (Patient taking differently: Take 5 mg by mouth as needed.)   ondansetron  (ZOFRAN ) 4 MG tablet Take 1 tablet (4 mg total) by mouth every 8 (eight) hours as needed for nausea or vomiting.   oxyCODONE -acetaminophen  (PERCOCET/ROXICET) 5-325 MG tablet Take 1 tablet by mouth every 4 (four) hours as needed for severe pain (pain score 7-10).   triamcinolone  ointment (KENALOG ) 0.5 % Apply 1 Application topically 2 (two) times daily as needed (itching).   Ubrogepant  (UBRELVY ) 100 MG TABS Take 1 tablet by mouth at onset of migraine. May repeat dose in 2 hours if migraine persists. Max daily dose 2 tablets/day.   Vitamin D , Ergocalciferol , (DRISDOL ) 1.25 MG (50000 UNIT)  CAPS capsule Take 1 capsule (50,000 Units total) by mouth every 7 (seven) days.   cetirizine  (ZYRTEC ) 10 MG tablet Take 1 tablet (10 mg total) by mouth daily. For itching (Patient not taking: Reported on 04/18/2024)   famotidine  (PEPCID ) 20 MG tablet Take 1 tablet (20 mg total) by mouth as needed for heartburn or indigestion. (Patient not taking: Reported on 04/18/2024)   Fremanezumab -vfrm (AJOVY ) 225 MG/1.5ML SOAJ Inject 1.5 mLs (225 mg) into the skin every 30 (thirty) days.   Iron , Ferrous Sulfate , 325 (65 Fe) MG TABS Take 1 tablet (325mg )  by mouth daily. (Patient not taking: Reported on 04/18/2024)   nitrofurantoin, macrocrystal-monohydrate, (MACROBID) 100 MG capsule Take 100 mg by mouth 2 (two) times daily. (Patient not taking: Reported on 04/18/2024)   omeprazole  (PRILOSEC) 40 MG capsule Take 1 capsule (40 mg total) by mouth 2 (two) times daily. (Patient not taking: Reported on 04/18/2024)   tirzepatide  (ZEPBOUND ) 10 MG/0.5ML Pen Inject 10 mg into the skin once a week. (Patient not taking: Reported on 04/18/2024)   tirzepatide  (ZEPBOUND ) 2.5 MG/0.5ML Pen Inject 2.5 mg into the skin once a week. For 4 weeks. (Patient not taking: Reported on 04/18/2024)   No current facility-administered medications on file prior to visit.    Allergies  Allergen Reactions   Fentanyl  Shortness Of Breath, Itching and Nausea And Vomiting    rash   Hydromorphone  Itching   Dilaudid  [Hydromorphone  Hcl] Itching   Morphine  Other (See Comments)    Family History  Problem Relation Age of Onset   Diabetes Father    High blood pressure Father    Sleep apnea Father    Obesity Father      ROS: No fever, fatigue, unexplained weight loss/gain, hearing or vision changes, cardiac or respiratory complaints. Denies neurological deficits,gastrointestinal or genitourinary complaints,  and skin changes.   Objective:   Today's Vitals   04/18/24 1029  BP: 122/70  Pulse: 83  Temp: 97.8 F (36.6 C)  TempSrc: Temporal   SpO2: 98%  Weight: (!) 330 lb 12.8 oz (150 kg)  Height: 5' 4 (1.626 m)    GENERAL APPEARANCE: Well-appearing, in NAD. Well nourished.  SKIN: Pink, warm and dry. Turgor normal. No rash, lesion, ulceration, or ecchymoses. Hair evenly distributed.  HEENT: HEAD: Normocephalic.  EYES: PERRLA. EOMI. Lids intact w/o defect. Sclera white, Conjunctiva pink w/o exudate.  EARS: External ear w/o redness, swelling, masses or lesions. EAC clear. TM's intact, translucent w/o bulging, appropriate landmarks visualized. Appropriate acuity to conversational tones.  NOSE: Septum midline w/o deformity. Nares patent, mucosa pink and non-inflamed w/o drainage.  THROAT: Uvula midline. Oropharynx clear. Tonsils absent. Oral mucosa pink and moist.  NECK: Supple, Trachea midline. Full ROM w/o pain or tenderness. No lymphadenopathy. Thyroid  non-tender w/o enlargement or palpable masses.  RESPIRATORY: Chest wall symmetrical w/o masses. Respirations even and non-labored. Breath sounds clear to auscultation bilaterally. No wheezes, rales, rhonchi, or crackles. CARDIAC: S1, S2 present, regular rate and rhythm. No gallops, murmurs, rubs, or clicks. Peripheral pulses 2+ bilaterally. GI: Abdomen soft w/o distention. Normoactive bowel sounds. No palpable masses or tenderness. No guarding or rebound tenderness. Liver and spleen w/o tenderness or enlargement. No CVA tenderness.  MSK: Muscle tone and strength appropriate for age, w/o atrophy or abnormal movement.  EXTREMITIES: Active ROM intact, w/o tenderness, crepitus, or contracture. No obvious joint deformities or effusions. No clubbing, edema, or cyanosis.  NEUROLOGIC: CN's II-XII intact. Motor strength symmetrical with no obvious weakness. No sensory deficits.  Steady, even gait.  PSYCH/MENTAL STATUS: Alert, oriented x 3. Cooperative, appropriate mood and affect.    Depression and Anxiety Screen done today and results listed below:     04/18/2024   10:42 AM 02/21/2024     1:59 PM 12/19/2023    3:47 PM 11/14/2023    3:13 PM 10/17/2023    3:24 PM  Depression screen PHQ 2/9  Decreased Interest 0 0 0 0 0  Down, Depressed, Hopeless 0 0 0 0 0  PHQ - 2 Score 0 0 0 0 0  Altered sleeping 0  Tired, decreased energy 0      Change in appetite 0      Feeling bad or failure about yourself  0      Trouble concentrating 0      Moving slowly or fidgety/restless 0      Suicidal thoughts 0      PHQ-9 Score 0      Difficult doing work/chores Not difficult at all          04/18/2024   10:42 AM 01/19/2023    3:43 PM  GAD 7 : Generalized Anxiety Score  Nervous, Anxious, on Edge 1 0  Control/stop worrying 1 0  Worry too much - different things 0 0  Trouble relaxing 1 0  Restless 1 0  Easily annoyed or irritable 1 0  Afraid - awful might happen 1 0  Total GAD 7 Score 6 0  Anxiety Difficulty Not difficult at all Not difficult at all     Assessment & Plan:  Annual physical exam -     CBC with Differential/Platelet -     Comprehensive metabolic panel with GFR -     TSH -     Lipid panel  Chronic bilateral low back pain, unspecified whether sciatica present - Advised patient she needs to contact the orthopedist that she will need to receive further imaging and evaluation.  Patient verbalized understanding and will call orthopedics to schedule appointment.   Orders Placed This Encounter  Procedures   CBC with Differential/Platelet   Comprehensive metabolic panel with GFR   TSH   Lipid panel    PATIENT COUNSELING:  - Encourage a healthy well-balanced diet. Patient may adjust caloric intake to maintain or achieve ideal body weight. May reduce intake of dietary saturated fat and total fat and have adequate dietary potassium and calcium preferably from fresh fruits, vegetables, and low-fat dairy products.    -  importance of regular exercise  NEXT PREVENTATIVE PHYSICAL DUE IN 1 YEAR.  Return in about 6 months (around 10/16/2024) for Chronic Condition follow  up.  Rosina Senters, FNP

## 2024-04-21 ENCOUNTER — Other Ambulatory Visit (HOSPITAL_COMMUNITY): Payer: Self-pay

## 2024-04-26 NOTE — Progress Notes (Deleted)
 NEUROLOGY CONSULTATION NOTE  Deborah Mcbride MRN: [id number] DOB: [birth year]  Referring provider: Rosina Senters, FNP Primary care provider: Rosina Senters, FNP  Reason for consult:  migraine  Assessment/Plan:   ***   Subjective:  Deborah Mcbride is a 38 year old ***-handed female who presents for migraines.  History supplemented by referring provider's note.  Onset:  *** Location:  *** Quality:  *** Intensity:  ***.  Aura:  *** Prodrome:  *** Postdrome:  *** Associated symptoms:  ***.  *** denies associated unilateral numbness or weakness. Duration:  *** Frequency:  *** Frequency of abortive medication: *** Triggers:  *** Relieving factors:  *** Activity:  ***  In September 2022, she had a migraine presenting as occipital headache with episode of unresponsiveness followed by right sided numbness and weakness.  Evaluated in the ED where CT perfusion of head was normal.    Past NSAIDS/analgesics:  naproxen , meloxicam , tramadol , Toradol  IM Past abortive triptans:  *** Past abortive ergotamine:  none Past muscle relaxants:  Flexeril , Robaxin  Past anti-emetic:  Zofran  4mg  Past antihypertensive medications:  *** Past antidepressant medications:  *** Past anticonvulsant medications:  *** Past anti-CGRP:  Qulipta  60mg  ***, Aimovig  70mg  *** Past vitamins/Herbal/Supplements:  *** Past antihistamines/decongestants:  Zyrtec , Flonase , Benadryl  Other past therapies:  ***  Current NSAIDS/analgesics:  acetaminophen , ibuprofen  800mg , Percocet (***) Current triptans:  none Current ergotamine:  none Current anti-emetic:  Zofran  4mg  Current muscle relaxants:  none Current Antihypertensive medications:  lisinopril  5mg  daily PRN Current Antidepressant medications:  none Current Anticonvulsant medications:  none Current anti-CGRP:  Ajovy , Ubrelvy  100mg  Current Vitamins/Herbal/Supplements:  none Current Antihistamines/Decongestants:  none Other therapy:  none Birth control:   ***   Caffeine:  *** Alcohol:  *** Smoker:  *** Diet:  *** Exercise:  *** Depression:  ***; Anxiety:  *** Other pain:  chronic back pain Sleep hygiene:  ***  History of TBI/concussion:  *** Family history of headache:  *** Family history of cerebral aneurysm:  ***      PAST MEDICAL HISTORY: Past Medical History:  Diagnosis Date   Anemia    Back pain    GERD (gastroesophageal reflux disease)    Joint pain    Obesity    Sleep apnea    Vitamin D  deficiency     PAST SURGICAL HISTORY: Past Surgical History:  Procedure Laterality Date   CESAREAN SECTION     CHOLECYSTECTOMY     TONSILLECTOMY     TUBAL LIGATION      MEDICATIONS: Current Outpatient Medications on File Prior to Visit  Medication Sig Dispense Refill   acetaminophen  (TYLENOL ) 500 MG tablet Take 2 tablets (1,000 mg total) by mouth every 6 (six) hours as needed for mild pain (pain score 1-3) or moderate pain (pain score 4-6). 100 tablet 0   cetirizine  (ZYRTEC ) 10 MG tablet Take 1 tablet (10 mg total) by mouth daily. For itching (Patient not taking: Reported on 04/18/2024) 30 tablet 0   famotidine  (PEPCID ) 20 MG tablet Take 1 tablet (20 mg total) by mouth as needed for heartburn or indigestion. (Patient not taking: Reported on 04/18/2024) 90 tablet 1   famotidine  (PEPCID ) 40 MG tablet Take 1 tablet (40 mg total) by mouth daily. (Patient taking differently: Take 40 mg by mouth as needed.) 90 tablet 1   Fremanezumab -vfrm (AJOVY ) 225 MG/1.5ML SOAJ Inject 1.5 mLs (225 mg) into the skin every 30 (thirty) days. 1.5 mL 3   hydrocortisone  2.5 % cream Apply topically 2 (two) times daily.  30 g 0   ibuprofen  (ADVIL ) 800 MG tablet Take 1 tablet (800 mg total) by mouth every 8 (eight) hours as needed for mild pain (pain score 1-3) or moderate pain (pain score 4-6). 30 tablet 0   Iron , Ferrous Sulfate , 325 (65 Fe) MG TABS Take 1 tablet (325mg ) by mouth daily. (Patient not taking: Reported on 04/18/2024) 100 tablet 1   lidocaine   (XYLOCAINE ) 2 % solution TAKE 5 MLS BY MOUTH THREE TIMES DAILY AS NEEDED FOR PAIN. SWISH AND GARGLE. KEEP IN MOUTH FOR 30 SECONDS AND SPIT (Patient taking differently: as needed.)     lisinopril  (ZESTRIL ) 2.5 MG tablet Take 2 tablets (5 mg total) by mouth daily. (Patient taking differently: Take 5 mg by mouth as needed.)     nitrofurantoin, macrocrystal-monohydrate, (MACROBID) 100 MG capsule Take 100 mg by mouth 2 (two) times daily. (Patient not taking: Reported on 04/18/2024)     omeprazole  (PRILOSEC) 40 MG capsule Take 1 capsule (40 mg total) by mouth 2 (two) times daily. (Patient not taking: Reported on 04/18/2024) 60 capsule 1   ondansetron  (ZOFRAN ) 4 MG tablet Take 1 tablet (4 mg total) by mouth every 8 (eight) hours as needed for nausea or vomiting. 20 tablet 0   oxyCODONE -acetaminophen  (PERCOCET/ROXICET) 5-325 MG tablet Take 1 tablet by mouth every 4 (four) hours as needed for severe pain (pain score 7-10). 24 tablet 0   tirzepatide  (ZEPBOUND ) 10 MG/0.5ML Pen Inject 10 mg into the skin once a week. (Patient not taking: Reported on 04/18/2024) 2 mL 2   tirzepatide  (ZEPBOUND ) 2.5 MG/0.5ML Pen Inject 2.5 mg into the skin once a week. For 4 weeks. (Patient not taking: Reported on 04/18/2024) 2 mL 0   triamcinolone  ointment (KENALOG ) 0.5 % Apply 1 Application topically 2 (two) times daily as needed (itching). 30 g 1   Ubrogepant  (UBRELVY ) 100 MG TABS Take 1 tablet by mouth at onset of migraine. May repeat dose in 2 hours if migraine persists. Max daily dose 2 tablets/day. 16 tablet 2   Vitamin D , Ergocalciferol , (DRISDOL ) 1.25 MG (50000 UNIT) CAPS capsule Take 1 capsule (50,000 Units total) by mouth every 7 (seven) days. 12 capsule 1   No current facility-administered medications on file prior to visit.    ALLERGIES: Allergies  Allergen Reactions   Fentanyl  Shortness Of Breath, Itching and Nausea And Vomiting    rash   Hydromorphone  Itching   Dilaudid  [Hydromorphone  Hcl] Itching   Morphine  Other  (See Comments)    FAMILY HISTORY: Family History  Problem Relation Age of Onset   Diabetes Father    High blood pressure Father    Sleep apnea Father    Obesity Father     Objective:  *** General: No acute distress.  Patient appears well-groomed.   Head:  Normocephalic/atraumatic Eyes:  fundi examined but not visualized Neck: supple, no paraspinal tenderness, full range of motion Heart: regular rate and rhythm Neurological Exam: Mental status: alert and oriented to person, place, and time, speech fluent and not dysarthric, language intact. Cranial nerves: CN I: not tested CN II: pupils equal, round and reactive to light, visual fields intact CN III, IV, VI:  full range of motion, no nystagmus, no ptosis CN V: facial sensation intact. CN VII: upper and lower face symmetric CN VIII: hearing intact CN IX, X: gag intact, uvula midline CN XI: sternocleidomastoid and trapezius muscles intact CN XII: tongue midline Bulk & Tone: normal, no fasciculations. Motor:  muscle strength 5/5 throughout Sensation:  Pinprick and  vibratory sensation intact. Deep Tendon Reflexes:  2+ throughout,  toes downgoing.   Finger to nose testing:  Without dysmetria.   Gait:  Normal station and stride.  Romberg negative.    Thank you for allowing me to take part in the care of this patient.  Juliene Dunnings, DO  CC: Rosina Senters, FNP

## 2024-04-29 ENCOUNTER — Ambulatory Visit: Admitting: Neurology

## 2024-04-29 ENCOUNTER — Encounter: Payer: Self-pay | Admitting: Neurology

## 2024-05-13 ENCOUNTER — Other Ambulatory Visit: Payer: Self-pay | Admitting: Internal Medicine

## 2024-05-13 ENCOUNTER — Other Ambulatory Visit (HOSPITAL_COMMUNITY): Payer: Self-pay

## 2024-05-14 ENCOUNTER — Other Ambulatory Visit (HOSPITAL_COMMUNITY): Payer: Self-pay

## 2024-05-14 ENCOUNTER — Other Ambulatory Visit: Payer: Self-pay | Admitting: Internal Medicine

## 2024-05-14 MED ORDER — ZEPBOUND 5 MG/0.5ML ~~LOC~~ SOAJ
5.0000 mg | SUBCUTANEOUS | 1 refills | Status: AC
  Filled 2024-05-14 – 2024-07-05 (×4): qty 2, 28d supply, fill #0

## 2024-05-14 NOTE — Telephone Encounter (Signed)
 Pt requesting refill for tirzepatide  (ZEPBOUND ) 2.5 MG/0.5ML Pen   LOV 04/18/24 FOV 10/16/24 LRF 03/27/24

## 2024-05-15 ENCOUNTER — Other Ambulatory Visit (HOSPITAL_COMMUNITY): Payer: Self-pay

## 2024-05-16 ENCOUNTER — Other Ambulatory Visit (HOSPITAL_COMMUNITY): Payer: Self-pay

## 2024-05-16 ENCOUNTER — Ambulatory Visit: Admitting: Neurology

## 2024-05-21 NOTE — Progress Notes (Unsigned)
 NEUROLOGY CONSULTATION NOTE  Deborah  KAHLEAH Mcbride MRN: [id number] DOB: 22-Jul-1986  Referring provider: Rosina Senters, FNP Primary care provider: Rosina Senters, FNP  Reason for consult:  migraine  Assessment/Plan:   Migraine without aura, without status migrainosus, intractable Probable bilateral carpal tunnel syndrome   Migraine prevention:  Plan to start Qulipta  60mg  daily.  If no improvement in 3 months, contact me Migraine rescue:  She will try samples of Nurtec.  Zofran  for nausea. Lifestyle modification: Limit use of pain relievers to no more than 9 days out of the month to prevent risk of rebound or medication-overuse headache. Diet modification/hydration/caffeine cessation Routine exercise Sleep hygiene Consider vitamins/supplements:  magnesium citrate 400mg  daily, riboflavin 400mg  daily, CoQ10 100mg  three times daily Keep headache diary Advised to wear wrist splints at night Follow up 7 months.    Subjective:  Deborah  D Mcbride is a 38 year old right-handed female who presents for migraines.  History supplemented by referring provider's note.  Migraines: Onset:  7th or 8th grade Location:  varies - bilateral frontal, holocephalic, unilateral either side Quality:  squeezing Intensity:  8-10/10.  Aura:  absent Prodrome:  absent Associated symptoms:  photophobia, phonophobia, sometimes nausea, once blurred vision.  She denies associated unilateral numbness or weakness. Duration:  Often wakes up with them.  3 days Frequency:  average 9 days a month (sometimes once a week or 3 times a week) Triggers:  looking at the computer screen, menstrual cycle Relieving factors:  stepping away from the computer, sleep Activity:  aggravates  In September 2022, she had a migraine presenting as occipital headache with episode of unresponsiveness followed by right sided numbness and weakness.  Evaluated in the ED where CT perfusion of head was normal.    Bilateral hand numbness: For  the past 2 weeks, she has noted numbness in both hands, involving the entire hand.  Notices it when in bed, driving or typing at the computer.  Denies weakness.  Denies neck pain.  Notes some aching in the upper arms and shoulders as well.  Past medications: Past NSAIDS/analgesics:  naproxen , meloxicam , tramadol , Toradol  IM Past abortive triptans:  sumatriptan tab/[place] Past abortive ergotamine:  none Past muscle relaxants:  Flexeril , Robaxin  Past anti-emetic:  Zofran  4mg  Past antihypertensive medications:  metoprolol Past antidepressant medications:  amitriptyline Past anticonvulsant medications:  topiramate Past anti-CGRP:  Aimovig  70mg , Ajovy  Past vitamins/Herbal/Supplements:  none Past antihistamines/decongestants:  Zyrtec , Flonase , Benadryl  Other past therapies:  none  Current NSAIDS/analgesics:  acetaminophen , ibuprofen  800mg  Current triptans:  none Current ergotamine:  none Current anti-emetic:  Zofran  4mg  Current muscle relaxants:  none Current Antihypertensive medications:  lisinopril  5mg  daily PRN Current Antidepressant medications:  none Current Anticonvulsant medications:  none Current anti-CGRP:  Ubrelvy  100mg  Current Vitamins/Herbal/Supplements:  D Current Antihistamines/Decongestants:  none Other therapy:  none Birth control:  none  She has had a formal dilated eye exam which was okay.  Caffeine:  none Depression:  no; Anxiety:  yes Working on weight loss.   Other pain:  chronic back pain Sleep hygiene:  Has OSA.  Using a mouth guard.  Previously on CPAP  History of TBI/concussion:  none Family history of headache:  mother Family history of cerebral aneurysm:  none      PAST MEDICAL HISTORY: Past Medical History:  Diagnosis Date   Anemia    Back pain    GERD (gastroesophageal reflux disease)    Joint pain    Obesity    Sleep apnea    Vitamin D  deficiency  PAST SURGICAL HISTORY: Past Surgical History:  Procedure Laterality Date   CESAREAN  SECTION     CHOLECYSTECTOMY     TONSILLECTOMY     TUBAL LIGATION      MEDICATIONS: Current Outpatient Medications on File Prior to Visit  Medication Sig Dispense Refill   acetaminophen  (TYLENOL ) 500 MG tablet Take 2 tablets (1,000 mg total) by mouth every 6 (six) hours as needed for mild pain (pain score 1-3) or moderate pain (pain score 4-6). 100 tablet 0   cetirizine  (ZYRTEC ) 10 MG tablet Take 1 tablet (10 mg total) by mouth daily. For itching 30 tablet 0   famotidine  (PEPCID ) 20 MG tablet Take 1 tablet (20 mg total) by mouth as needed for heartburn or indigestion. 90 tablet 1   famotidine  (PEPCID ) 40 MG tablet Take 1 tablet (40 mg total) by mouth daily. 90 tablet 1   Fremanezumab -vfrm (AJOVY ) 225 MG/1.5ML SOAJ Inject 1.5 mLs (225 mg) into the skin every 30 (thirty) days. 1.5 mL 3   hydrocortisone  2.5 % cream Apply topically 2 (two) times daily. 30 g 0   ibuprofen  (ADVIL ) 800 MG tablet Take 1 tablet (800 mg total) by mouth every 8 (eight) hours as needed for mild pain (pain score 1-3) or moderate pain (pain score 4-6). 30 tablet 0   lisinopril  (ZESTRIL ) 2.5 MG tablet Take 2 tablets (5 mg total) by mouth daily.     omeprazole  (PRILOSEC) 40 MG capsule Take 1 capsule (40 mg total) by mouth 2 (two) times daily. 60 capsule 1   ondansetron  (ZOFRAN ) 4 MG tablet Take 1 tablet (4 mg total) by mouth every 8 (eight) hours as needed for nausea or vomiting. 20 tablet 0   oxyCODONE -acetaminophen  (PERCOCET/ROXICET) 5-325 MG tablet Take 1 tablet by mouth every 4 (four) hours as needed for severe pain (pain score 7-10). 24 tablet 0   tirzepatide  (ZEPBOUND ) 5 MG/0.5ML Pen Inject 5 mg into the skin once a week. 2 mL 1   triamcinolone  ointment (KENALOG ) 0.5 % Apply 1 Application topically 2 (two) times daily as needed (itching). 30 g 1   Ubrogepant  (UBRELVY ) 100 MG TABS Take 1 tablet by mouth at onset of migraine. May repeat dose in 2 hours if migraine persists. Max daily dose 2 tablets/day. 16 tablet 2    Vitamin D , Ergocalciferol , (DRISDOL ) 1.25 MG (50000 UNIT) CAPS capsule Take 1 capsule (50,000 Units total) by mouth every 7 (seven) days. 12 capsule 1   Iron , Ferrous Sulfate , 325 (65 Fe) MG TABS Take 1 tablet (325mg ) by mouth daily. (Patient not taking: Reported on 05/22/2024) 100 tablet 1   lidocaine  (XYLOCAINE ) 2 % solution TAKE 5 MLS BY MOUTH THREE TIMES DAILY AS NEEDED FOR PAIN. SWISH AND GARGLE. KEEP IN MOUTH FOR 30 SECONDS AND SPIT (Patient taking differently: as needed.)     No current facility-administered medications on file prior to visit.     ALLERGIES: Allergies  Allergen Reactions   Fentanyl  Shortness Of Breath, Itching and Nausea And Vomiting    rash   Hydromorphone  Itching   Dilaudid  [Hydromorphone  Hcl] Itching   Morphine  Other (See Comments)    FAMILY HISTORY: Family History  Problem Relation Age of Onset   Diabetes Father    High blood pressure Father    Sleep apnea Father    Obesity Father     Objective:  Blood pressure 135/63, pulse 87, height 5' 4 (1.626 m), weight (!) 335 lb 6.4 oz (152.1 kg), SpO2 98%. General: No acute distress.  Patient  appears well-groomed.   Head:  Normocephalic/atraumatic Eyes:  fundi examined but not visualized Neck: supple, no paraspinal tenderness, full range of motion Heart: regular rate and rhythm Neurological Exam: Mental status: alert and oriented to person, place, and time, speech fluent and not dysarthric, language intact. Cranial nerves: CN I: not tested CN II: pupils equal, round and reactive to light, visual fields intact CN III, IV, VI:  full range of motion, no nystagmus, no ptosis CN V: facial sensation intact. CN VII: upper and lower face symmetric CN VIII: hearing intact CN IX, X: gag intact, uvula midline CN XI: sternocleidomastoid and trapezius muscles intact CN XII: tongue midline Bulk & Tone: normal, no fasciculations. Motor:  muscle strength 5/5 throughout Sensation:  Pinprick and vibratory sensation  intact. Deep Tendon Reflexes:  2+ throughout,  toes downgoing.   Finger to nose testing:  Without dysmetria.   Gait:  Normal station and stride.  Romberg negative.    Thank you for allowing me to take part in the care of this patient.  Juliene Dunnings, DO  CC: Rosina Senters, FNP

## 2024-05-22 ENCOUNTER — Other Ambulatory Visit (HOSPITAL_COMMUNITY): Payer: Self-pay

## 2024-05-22 ENCOUNTER — Ambulatory Visit (INDEPENDENT_AMBULATORY_CARE_PROVIDER_SITE_OTHER): Admitting: Neurology

## 2024-05-22 ENCOUNTER — Encounter: Payer: Self-pay | Admitting: Neurology

## 2024-05-22 ENCOUNTER — Telehealth (HOSPITAL_COMMUNITY): Payer: Self-pay

## 2024-05-22 VITALS — BP 135/63 | HR 87 | Ht 64.0 in | Wt 335.4 lb

## 2024-05-22 DIAGNOSIS — G43019 Migraine without aura, intractable, without status migrainosus: Secondary | ICD-10-CM

## 2024-05-22 MED ORDER — QULIPTA 60 MG PO TABS
60.0000 mg | ORAL_TABLET | Freq: Every day | ORAL | 11 refills | Status: AC
  Filled 2024-05-22 – 2024-06-06 (×2): qty 30, 30d supply, fill #0
  Filled 2024-06-26 – 2024-07-05 (×2): qty 30, 30d supply, fill #1
  Filled 2024-07-17 – 2024-08-01 (×3): qty 30, 30d supply, fill #2
  Filled 2024-08-28: qty 30, 30d supply, fill #3

## 2024-05-22 NOTE — Patient Instructions (Signed)
  Start QULIPTA  60MG  DAILY.  Contact us  in 3 months  with update and we can increase dose if needed. Take NURTEC at earliest onset of headache.  Maximum 1 tablet in 24 hours.  Let me know if it works Limit use of pain relievers to no more than 9 days out of the month.  These medications include acetaminophen , NSAIDs (ibuprofen /Advil /Motrin , naproxen /Aleve , triptans (Imitrex/sumatriptan), Excedrin, and narcotics.  This will help reduce risk of rebound headaches. Be aware of common food triggers Routine exercise Stay adequately hydrated (aim for 64 oz water daily) Keep headache diary Maintain proper stress management Maintain proper sleep hygiene Do not skip meals Consider supplements:  magnesium citrate 400mg  daily, riboflavin 400mg  daily, coenzyme Q10 100mg  three times daily.

## 2024-05-23 ENCOUNTER — Encounter: Payer: Self-pay | Admitting: Neurology

## 2024-05-24 ENCOUNTER — Other Ambulatory Visit (HOSPITAL_COMMUNITY): Payer: Self-pay

## 2024-05-28 ENCOUNTER — Telehealth: Payer: Self-pay

## 2024-05-28 NOTE — Telephone Encounter (Signed)
   CLINICAL USE BELOW THIS LINE (use X to signify action taken)  _X__ Form received and placed in providers office for signature. ___ Form completed and faxed to LOA Dept.  ___ Form completed & LVM to notify patient ready for pick up.  ___ Charge sheet and copy of form in front office folder for office supervisor.    Once signed, fax to [phone].

## 2024-05-29 NOTE — Telephone Encounter (Signed)
 Spoke with pt she understands that her PCP is not responsible for this paperwork and she would reach out to her pulmonologist.

## 2024-06-03 ENCOUNTER — Other Ambulatory Visit (HOSPITAL_COMMUNITY): Payer: Self-pay

## 2024-06-03 ENCOUNTER — Encounter: Payer: Self-pay | Admitting: Radiology

## 2024-06-03 ENCOUNTER — Other Ambulatory Visit: Payer: Self-pay | Admitting: Neurology

## 2024-06-03 ENCOUNTER — Telehealth (HOSPITAL_COMMUNITY): Payer: Self-pay

## 2024-06-03 ENCOUNTER — Telehealth: Payer: Self-pay

## 2024-06-03 MED ORDER — NURTEC 75 MG PO TBDP
1.0000 | ORAL_TABLET | Freq: Every day | ORAL | 5 refills | Status: AC | PRN
  Filled 2024-06-03: qty 8, 30d supply, fill #0
  Filled 2024-06-06: qty 16, 16d supply, fill #0
  Filled 2024-06-06: qty 16, 30d supply, fill #0
  Filled 2024-06-26: qty 16, 16d supply, fill #1
  Filled 2024-07-05: qty 16, 30d supply, fill #1
  Filled 2024-07-17 – 2024-08-01 (×3): qty 16, 30d supply, fill #2
  Filled 2024-08-28: qty 16, 30d supply, fill #3

## 2024-06-03 NOTE — Telephone Encounter (Signed)
 Medication Samples have been provided to the patient.  Drug name: nurtec       Strength: 75mg         Qty: 4  LOT: [id number]  Exp.Date: 06/2027  Dosing instructions: take as needed  The patient has been instructed regarding the correct time, dose, and frequency of taking this medication, including desired effects and most common side effects.   Medication Samples have been provided to the patient.  Drug name: qulipta        Strength: 75mg         Qty: 5  LOT: [id number]  Exp.Date: 04/2026  Dosing instructions: take as directed   The patient has been instructed regarding the correct time, dose, and frequency of taking this medication, including desired effects and most common side effects.

## 2024-06-03 NOTE — Telephone Encounter (Signed)
 Pt needs a PA for her nurtec and Qulipta 

## 2024-06-04 ENCOUNTER — Telehealth: Payer: Self-pay | Admitting: Pharmacy Technician

## 2024-06-04 ENCOUNTER — Other Ambulatory Visit (HOSPITAL_COMMUNITY): Payer: Self-pay

## 2024-06-04 NOTE — Telephone Encounter (Signed)
 PA has been submitted, and telephone encounter has been created. Please see telephone encounter dated 11.4.25.

## 2024-06-04 NOTE — Telephone Encounter (Signed)
 Pharmacy Patient Advocate Encounter  Received notification from [facility] that Prior Authorization for QULIPTA  60MG  has been APPROVED from 11.4.25 to 1.27.26. Ran test claim, Copay is $0. This test claim was processed through [facility]- copay amounts may vary at other pharmacies due to pharmacy/plan contracts, or as the patient moves through the different stages of their insurance plan.   PA #/Case ID/Reference #: [id number]

## 2024-06-04 NOTE — Telephone Encounter (Signed)
 Pharmacy Patient Advocate Encounter   Received notification from Pt Calls Messages that prior authorization for NURTEC 75MG  is required/requested.   Insurance verification completed.   The patient is insured through [facility].   Per test claim: PA required; PA submitted to above mentioned insurance via Latent Key/confirmation #/EOC [id number] Status is pending

## 2024-06-04 NOTE — Telephone Encounter (Signed)
 Pharmacy Patient Advocate Encounter  Received notification from [facility] that Prior Authorization for NURTEC 75MG  has been APPROVED from 11.4.25 to 1.27.26. Ran test claim, Copay is $0. This test claim was processed through [facility]- copay amounts may vary at other pharmacies due to pharmacy/plan contracts, or as the patient moves through the different stages of their insurance plan.   PA #/Case ID/Reference #: [id number]

## 2024-06-04 NOTE — Telephone Encounter (Signed)
 Pharmacy Patient Advocate Encounter   Received notification from Pt Calls Messages that prior authorization for QULIPTA  60MG  is required/requested.   Insurance verification completed.   The patient is insured through [facility].   Per test claim: PA required; PA submitted to above mentioned insurance via Latent Key/confirmation #/EOC [id number] Status is pending

## 2024-06-06 ENCOUNTER — Other Ambulatory Visit (HOSPITAL_COMMUNITY): Payer: Self-pay

## 2024-06-07 NOTE — Telephone Encounter (Signed)
 Tammy G from [facility] at [phone] stopped by to check on status of a script for cpap supplies. Please advise Tammy.

## 2024-06-11 NOTE — Telephone Encounter (Signed)
 LVM for Tammy to call back 06/11/24

## 2024-06-18 ENCOUNTER — Ambulatory Visit: Admitting: Neurology

## 2024-06-20 ENCOUNTER — Other Ambulatory Visit (HOSPITAL_COMMUNITY): Payer: Self-pay

## 2024-06-20 ENCOUNTER — Encounter (HOSPITAL_COMMUNITY): Payer: Self-pay

## 2024-06-20 ENCOUNTER — Encounter: Payer: Self-pay | Admitting: Internal Medicine

## 2024-06-20 ENCOUNTER — Telehealth (HOSPITAL_COMMUNITY): Payer: Self-pay

## 2024-06-20 DIAGNOSIS — E781 Pure hyperglyceridemia: Secondary | ICD-10-CM

## 2024-06-20 DIAGNOSIS — Z6841 Body Mass Index (BMI) 40.0 and over, adult: Secondary | ICD-10-CM

## 2024-06-20 DIAGNOSIS — E559 Vitamin D deficiency, unspecified: Secondary | ICD-10-CM

## 2024-06-20 DIAGNOSIS — R632 Polyphagia: Secondary | ICD-10-CM

## 2024-06-20 DIAGNOSIS — E88819 Insulin resistance, unspecified: Secondary | ICD-10-CM

## 2024-06-21 MED ORDER — ONDANSETRON HCL 4 MG PO TABS: 4.0000 mg | ORAL_TABLET | Freq: Three times a day (TID) | ORAL | 0 refills | Status: AC | PRN

## 2024-06-21 MED ORDER — VITAMIN D (ERGOCALCIFEROL) 1.25 MG (50000 UNIT) PO CAPS: 50000.0000 [IU] | ORAL_CAPSULE | ORAL | 1 refills | Status: DC

## 2024-06-21 NOTE — Telephone Encounter (Signed)
 Spoke with Tammy about this and stated to her that it was sleep study who needed to complete paperwork.

## 2024-06-21 NOTE — Telephone Encounter (Signed)
 Pt requesting refill for medication   LOV 04/18/24 FOV 10/16/24 LRF Zofran  08/10/23 Historical provider         Vitamin D  10/19/23

## 2024-06-26 ENCOUNTER — Other Ambulatory Visit (HOSPITAL_COMMUNITY): Payer: Self-pay

## 2024-06-28 ENCOUNTER — Other Ambulatory Visit: Payer: Self-pay

## 2024-07-05 ENCOUNTER — Other Ambulatory Visit (HOSPITAL_COMMUNITY): Payer: Self-pay

## 2024-07-05 ENCOUNTER — Other Ambulatory Visit: Payer: Self-pay

## 2024-07-05 NOTE — Telephone Encounter (Signed)
 Harlene RONAL Sor from [facility] called pt was questioning the insurance that her Prior shara was use and wanted to know about the price of 200. 00 dollars she had to pay. Wanting to use medicaid.

## 2024-07-08 DIAGNOSIS — G4733 Obstructive sleep apnea (adult) (pediatric): Secondary | ICD-10-CM | POA: Diagnosis not present

## 2024-07-10 ENCOUNTER — Other Ambulatory Visit (HOSPITAL_COMMUNITY): Payer: Self-pay

## 2024-07-12 ENCOUNTER — Ambulatory Visit: Payer: Self-pay | Admitting: Surgery

## 2024-07-12 ENCOUNTER — Telehealth (HOSPITAL_BASED_OUTPATIENT_CLINIC_OR_DEPARTMENT_OTHER): Payer: Self-pay

## 2024-07-12 DIAGNOSIS — R162 Hepatomegaly with splenomegaly, not elsewhere classified: Secondary | ICD-10-CM | POA: Diagnosis not present

## 2024-07-12 DIAGNOSIS — K219 Gastro-esophageal reflux disease without esophagitis: Secondary | ICD-10-CM | POA: Diagnosis not present

## 2024-07-12 DIAGNOSIS — Z6841 Body Mass Index (BMI) 40.0 and over, adult: Secondary | ICD-10-CM | POA: Diagnosis not present

## 2024-07-12 NOTE — Telephone Encounter (Signed)
° °  Name: Deborah Mcbride  KEYAIRRA KOLINSKI  DOB: [birth year]  MRN: [id number]  Primary Cardiologist: Elmira   Chart reviewed as part of pre-operative protocol coverage. Because of [name]'s past medical history and time since last visit, she will require a follow-up in-office visit in order to better assess preoperative cardiovascular risk. Patient seen for frequent palpitations that were symptomatic. Was told they were benign.  The patient also experienced a brief episode of breathlessness and a moment of disorientation while talking to a nurse on the phone, during which her heart rate dropped to twenty-five and her oxygen level dropped to seventy, as measured by her pulse oximeter. Was given reassurance and to come back prn. Due to previous symptoms and moderate risk surgery, needs to be seen in person.   Pre-op covering staff: - Please schedule appointment and call patient to inform them. If patient already had an upcoming appointment within acceptable timeframe, please add pre-op clearance to the appointment notes so provider is aware. - Please contact requesting surgeon's office via preferred method (i.e, phone, fax) to inform them of need for appointment prior to surgery.    Lamarr Satterfield, NP  07/12/2024, 4:37 PM

## 2024-07-12 NOTE — Telephone Encounter (Signed)
° °  Pre-operative Risk Assessment    Patient Name: Deborah  CHARENE Mcbride  DOB: [birth year] MRN: [id number]   Date of last office visit: 10/12/23 with Dr. Elmira  Date of next office visit: Na  Request for Surgical Clearance    Procedure:  bariatric surgery  Date of Surgery:  Clearance TBD                                 Surgeon:  Dr. Lyndel Surgeon's Group or Practice Name:  [facility] Phone number:  [phone] Fax number:  [phone]   Type of Clearance Requested:   - Medical    Type of Anesthesia:  General    Additional requests/questions:    SignedAugustin JONETTA Daring   07/12/2024, 4:31 PM

## 2024-07-12 NOTE — Telephone Encounter (Signed)
 1st attempt to reach pt regarding surgical clearance and the need for an IN OFFICE appointment. Patient answered, as were were attempting to schedule she placed the call on hold for 10 minutes. Pt will have to call back to schedule.

## 2024-07-15 ENCOUNTER — Other Ambulatory Visit (HOSPITAL_COMMUNITY): Payer: Self-pay | Admitting: Surgery

## 2024-07-15 NOTE — Telephone Encounter (Signed)
 Pt has been scheduled to see Rollo Louder, [place] 08/08/24 @ 1:55 for preop clearance.   I will update all parties involved.

## 2024-07-17 ENCOUNTER — Telehealth: Payer: Self-pay

## 2024-07-17 ENCOUNTER — Other Ambulatory Visit (HOSPITAL_COMMUNITY): Payer: Self-pay

## 2024-07-17 ENCOUNTER — Other Ambulatory Visit: Payer: Self-pay | Admitting: Internal Medicine

## 2024-07-17 DIAGNOSIS — K219 Gastro-esophageal reflux disease without esophagitis: Secondary | ICD-10-CM

## 2024-07-17 NOTE — Telephone Encounter (Signed)
 See refill encounter 07/17/24.

## 2024-07-18 ENCOUNTER — Other Ambulatory Visit (HOSPITAL_COMMUNITY): Payer: Self-pay

## 2024-07-18 ENCOUNTER — Other Ambulatory Visit: Payer: Self-pay | Admitting: Internal Medicine

## 2024-07-18 DIAGNOSIS — E66813 Obesity, class 3: Secondary | ICD-10-CM

## 2024-07-18 MED ORDER — ZEPBOUND 7.5 MG/0.5ML ~~LOC~~ SOAJ
7.5000 mg | SUBCUTANEOUS | 2 refills | Status: AC
  Filled 2024-07-18 – 2024-08-01 (×3): qty 2, 28d supply, fill #0
  Filled 2024-08-28: qty 2, 28d supply, fill #1

## 2024-07-18 NOTE — Telephone Encounter (Signed)
 Prescription refill increased sent in.

## 2024-07-19 ENCOUNTER — Other Ambulatory Visit (HOSPITAL_COMMUNITY)

## 2024-07-19 NOTE — Telephone Encounter (Signed)
 The Zepbound  does not come in 1mg  doses. The next increase in dosage from 5mg  is the 7.5mg  . The 7.5mg  was sent in

## 2024-07-22 ENCOUNTER — Other Ambulatory Visit (HOSPITAL_COMMUNITY): Payer: Self-pay

## 2024-07-22 ENCOUNTER — Encounter (HOSPITAL_COMMUNITY): Payer: Self-pay

## 2024-07-22 ENCOUNTER — Other Ambulatory Visit: Payer: Self-pay

## 2024-07-22 ENCOUNTER — Ambulatory Visit (HOSPITAL_COMMUNITY)

## 2024-07-22 MED ORDER — OMEPRAZOLE 40 MG PO CPDR
40.0000 mg | DELAYED_RELEASE_CAPSULE | Freq: Two times a day (BID) | ORAL | 1 refills | Status: AC
  Filled 2024-07-22 – 2024-08-28 (×2): qty 60, 30d supply, fill #0

## 2024-07-23 ENCOUNTER — Other Ambulatory Visit (HOSPITAL_COMMUNITY): Payer: Self-pay

## 2024-07-29 DIAGNOSIS — R162 Hepatomegaly with splenomegaly, not elsewhere classified: Secondary | ICD-10-CM | POA: Diagnosis not present

## 2024-07-29 DIAGNOSIS — E88819 Insulin resistance, unspecified: Secondary | ICD-10-CM | POA: Diagnosis not present

## 2024-07-29 DIAGNOSIS — I159 Secondary hypertension, unspecified: Secondary | ICD-10-CM | POA: Diagnosis not present

## 2024-07-29 DIAGNOSIS — R6889 Other general symptoms and signs: Secondary | ICD-10-CM | POA: Diagnosis not present

## 2024-07-29 DIAGNOSIS — D509 Iron deficiency anemia, unspecified: Secondary | ICD-10-CM | POA: Diagnosis not present

## 2024-07-29 DIAGNOSIS — Z79899 Other long term (current) drug therapy: Secondary | ICD-10-CM | POA: Diagnosis not present

## 2024-07-29 DIAGNOSIS — K219 Gastro-esophageal reflux disease without esophagitis: Secondary | ICD-10-CM | POA: Diagnosis not present

## 2024-07-29 DIAGNOSIS — E7800 Pure hypercholesterolemia, unspecified: Secondary | ICD-10-CM | POA: Diagnosis not present

## 2024-07-29 DIAGNOSIS — E559 Vitamin D deficiency, unspecified: Secondary | ICD-10-CM | POA: Diagnosis not present

## 2024-07-29 DIAGNOSIS — E781 Pure hyperglyceridemia: Secondary | ICD-10-CM | POA: Diagnosis not present

## 2024-07-29 NOTE — Progress Notes (Unsigned)
" °  Cardiology Office Note   Date:  07/29/2024  ID:  Tanea  D Elektra, Wartman Dec 23, 1985, MRN [id number] PCP: Billy Knee, FNP  [facility] Providers Cardiologist:  Newman JINNY Lawrence, MD    History of Present Illness Deborah Mcbride is a 38 y.o. female with a past medical history of bradycardia, obesity, OSA on CPAP. Patient presents today for a preoperative evaluation   Patient previously wore a cardiac monitor in 09/2023 that showed predominantly normal sinus rhythm with a HR of 74 BPM. There were rare PVCs and PACs. There was one episode of SVT that lasted 7 beats. She was seen by Dr. Lawrence on 10/12/23 for palpitations. She reported having episodes of low HR into the 40s. Was overall asymptomatic with these episodes and they usually occurred while she was at rest. Patient was reassured, instructed to increase physical activity and work on weight loss. Also recommended that she avoided caffeine for palpitations   Patient now pending bariatric surgery. Cardiology clearance recommended   Preop Recommendations   Bradycardia  - Patient reportedly had episodes of bradycardia with HR to the 40s last December/January. Was overall asymptomatic. Cardiac monitor in 09/2023 showed NSR with an average HR of 74 BPM. Was overall reassuring  -   ROS: ***  Studies Reviewed      *** Risk Assessment/Calculations {Does this patient have ATRIAL FIBRILLATION?[phone]} No BP recorded.  {Refresh Note OR Click here to enter BP  :1}***       Physical Exam VS:  There were no vitals taken for this visit.       Wt Readings from Last 3 Encounters:  05/22/24 (!) 335 lb 6.4 oz (152.1 kg)  04/18/24 (!) 330 lb 12.8 oz (150 kg)  02/21/24 (!) 323 lb 12.8 oz (146.9 kg)    GEN: Well nourished, well developed in no acute distress NECK: No JVD; No carotid bruits CARDIAC: ***RRR, no murmurs, rubs, gallops RESPIRATORY:  Clear to auscultation without rales, wheezing or rhonchi  ABDOMEN: Soft,  non-tender, non-distended EXTREMITIES:  No edema; No deformity   ASSESSMENT AND PLAN ***    {Are you ordering a CV Procedure (e.g. stress test, cath, DCCV, TEE, etc)?   Press F2        :[id number]}  Dispo: ***  Signed, Rollo FABIENE Louder, PA-C   "

## 2024-07-30 ENCOUNTER — Ambulatory Visit (HOSPITAL_COMMUNITY)
Admission: RE | Admit: 2024-07-30 | Discharge: 2024-07-30 | Disposition: A | Discharge status: Home / Self Care | Source: Ambulatory Visit | Attending: Surgery | Admitting: Surgery

## 2024-07-30 LAB — LAB REPORT - SCANNED
A1c: 5.3
TSH: 1.15 (ref 0.41–5.90)

## 2024-08-02 ENCOUNTER — Other Ambulatory Visit (HOSPITAL_COMMUNITY): Payer: Self-pay

## 2024-08-05 ENCOUNTER — Other Ambulatory Visit (HOSPITAL_COMMUNITY): Payer: Self-pay

## 2024-08-06 ENCOUNTER — Other Ambulatory Visit (HOSPITAL_COMMUNITY): Payer: Self-pay

## 2024-08-07 ENCOUNTER — Ambulatory Visit (INDEPENDENT_AMBULATORY_CARE_PROVIDER_SITE_OTHER): Admitting: Internal Medicine

## 2024-08-07 ENCOUNTER — Telehealth: Payer: Self-pay

## 2024-08-07 VITALS — BP 132/80 | HR 76 | Temp 98.2°F | Ht 64.0 in | Wt 329.4 lb

## 2024-08-07 DIAGNOSIS — G43701 Chronic migraine without aura, not intractable, with status migrainosus: Secondary | ICD-10-CM | POA: Diagnosis not present

## 2024-08-07 DIAGNOSIS — M79671 Pain in right foot: Secondary | ICD-10-CM | POA: Diagnosis not present

## 2024-08-07 NOTE — Progress Notes (Signed)
 " [facility] [address] [place] [address] Dept: [phone] Dept Fax: [phone]  Acute Care Office Visit  Subjective:   Deborah Mcbride August 12, 1985 08/07/2024  Chief Complaint  Patient presents with   paperwork    FMLA need to be completed    HPI:  Discussed the use of AI scribe software for clinical note transcription with the patient, who gave verbal consent to proceed.  History of Present Illness   Deborah Mcbride is a 39 year old female who presents to discuss FMLA paperwork renewal.  She requires renewal of her FMLA paperwork due to migraines.  Each migraine episode can last one to three days and occur up to three times a month. She is taking Qulipta  for migraine prevention and Nurtec for acute attacks. While the frequency of migraines has decreased with medication, they persist.  She experiences pain in the middle of her right foot for the past three to four days. The pain is constant and severe, causing her to walk on her tiptoes. She has tried wearing slippers and using arch supports, but the pain persists.       The following portions of the patient's history were reviewed and updated as appropriate: past medical history, past surgical history, family history, social history, allergies, medications, and problem list.   Patient Active Problem List   Diagnosis Date Noted   Hypertension 02/05/2024   Elevated LDL cholesterol level 10/12/2023   Precordial pain 10/11/2023   Chronic migraine without aura with status migrainosus, not intractable 09/05/2023   Iron  deficiency anemia 08/01/2023   Insulin  resistance 07/31/2023   Hypertriglyceridemia 07/31/2023   Vitamin D  deficiency 07/31/2023   Hepatosplenomegaly 04/10/2023   Nasal congestion 01/19/2023   Sinus tachycardia 12/07/2022   OSA (obstructive sleep apnea) 10/21/2022   BMI 50.0-59.9, adult (HCC) 08/03/2022   Morbid obesity (HCC) 07/07/2022   OSA  treated with BiPAP 07/07/2022   Gastroesophageal reflux disease without esophagitis 07/07/2022   Past Medical History:  Diagnosis Date   Anemia    Back pain    GERD (gastroesophageal reflux disease)    Joint pain    Obesity    Sleep apnea    Vitamin D  deficiency    Past Surgical History:  Procedure Laterality Date   CESAREAN SECTION     CHOLECYSTECTOMY     TONSILLECTOMY     TUBAL LIGATION     Family History  Problem Relation Age of Onset   Diabetes Father    High blood pressure Father    Sleep apnea Father    Obesity Father    Current Medications[1] Allergies[2]   ROS: A complete ROS was performed with pertinent positives/negatives noted in the HPI. The remainder of the ROS are negative.    Objective:   Today's Vitals   08/07/24 1409  BP: 132/80  Pulse: 76  Temp: 98.2 F (36.8 C)  TempSrc: Temporal  SpO2: 98%  Weight: (!) 329 lb 6.4 oz (149.4 kg)  Height: 5' 4 (1.626 m)    GENERAL: Well-appearing, in NAD. Well nourished.  SKIN: Pink, warm and dry. No rash or ecchymosis.  RESPIRATORY: Chest wall symmetrical. Respirations even and non-labored.  MSK: Pain to plantar aspect of right midfoot. No obvious deformities. No swelling, redness, open wounds/sores.  PSYCH/MENTAL STATUS: Alert, oriented x 3. Cooperative, appropriate mood and affect.    No results found for any visits on 08/07/24.    Assessment & Plan:  Assessment and Plan  Chronic migraine without aura with status migrainosus Chronic migraines well-controlled with Qulipta  and Nurtec. Episodes last one to three days. FMLA paperwork renewal needed. - Renewed FMLA paperwork for one year. - Continue Qulipta  for prevention. - Use Nurtec for acute attacks. - Continue routine follow-up with neurology  Right foot pain Acute right foot pain for three to four days, localized to midfoot. Differential includes overuse injury or plantar fasciitis, latter less likely.  Pes planus. - Take ibuprofen   400mg -600mg  up to three times daily for five days. - Apply ice with frozen water bottle. - Perform stretches three to four times daily. - Retry arch supports if pain improves. - Refer to podiatry if pain persists for further evaluation and possible imaging.       No orders of the defined types were placed in this encounter.  No orders of the defined types were placed in this encounter.  Lab Orders  No laboratory test(s) ordered today   No images are attached to the encounter or orders placed in the encounter.  Return for Scheduled Routine Office Visits and as needed.   Rosina Senters, FNP    [1]  Current Outpatient Medications:    acetaminophen  (TYLENOL ) 500 MG tablet, Take 2 tablets (1,000 mg total) by mouth every 6 (six) hours as needed for mild pain (pain score 1-3) or moderate pain (pain score 4-6)., Disp: 100 tablet, Rfl: 0   Atogepant  (QULIPTA ) 60 MG TABS, Take 1 tablet (60 mg total) by mouth daily., Disp: 30 tablet, Rfl: 11   cetirizine  (ZYRTEC ) 10 MG tablet, Take 1 tablet (10 mg total) by mouth daily. For itching, Disp: 30 tablet, Rfl: 0   famotidine  (PEPCID ) 20 MG tablet, Take 1 tablet (20 mg total) by mouth as needed for heartburn or indigestion., Disp: 90 tablet, Rfl: 1   famotidine  (PEPCID ) 40 MG tablet, Take 1 tablet (40 mg total) by mouth daily., Disp: 90 tablet, Rfl: 1   hydrocortisone  2.5 % cream, Apply topically 2 (two) times daily., Disp: 30 g, Rfl: 0   ibuprofen  (ADVIL ) 800 MG tablet, Take 1 tablet (800 mg total) by mouth every 8 (eight) hours as needed for mild pain (pain score 1-3) or moderate pain (pain score 4-6)., Disp: 30 tablet, Rfl: 0   lisinopril  (ZESTRIL ) 2.5 MG tablet, Take 2 tablets (5 mg total) by mouth daily., Disp: , Rfl:    omeprazole  (PRILOSEC) 40 MG capsule, Take 1 capsule (40 mg total) by mouth 2 (two) times daily., Disp: 60 capsule, Rfl: 1   ondansetron  (ZOFRAN ) 4 MG tablet, Take 1 tablet (4 mg total) by mouth every 8 (eight) hours as needed for  nausea or vomiting., Disp: 20 tablet, Rfl: 0   oxyCODONE -acetaminophen  (PERCOCET/ROXICET) 5-325 MG tablet, Take 1 tablet by mouth every 4 (four) hours as needed for severe pain (pain score 7-10)., Disp: 24 tablet, Rfl: 0   Rimegepant Sulfate  (NURTEC) 75 MG TBDP, Take 1 tablet (75 mg total) by mouth daily as needed., Disp: 16 tablet, Rfl: 5   tirzepatide  (ZEPBOUND ) 7.5 MG/0.5ML Pen, Inject 7.5 mg into the skin once a week., Disp: 2 mL, Rfl: 2   triamcinolone  ointment (KENALOG ) 0.5 %, Apply 1 Application topically 2 (two) times daily as needed (itching)., Disp: 30 g, Rfl: 1   Vitamin D , Ergocalciferol , (DRISDOL ) 1.25 MG (50000 UNIT) CAPS capsule, Take 1 capsule (50,000 Units total) by mouth every 7 (seven) days., Disp: 12 capsule, Rfl: 1   Iron , Ferrous Sulfate , 325 (65 Fe) MG TABS, Take 1 tablet (  325mg ) by mouth daily. (Patient not taking: Reported on 05/22/2024), Disp: 100 tablet, Rfl: 1   lidocaine  (XYLOCAINE ) 2 % solution, TAKE 5 MLS BY MOUTH THREE TIMES DAILY AS NEEDED FOR PAIN. SWISH AND GARGLE. KEEP IN MOUTH FOR 30 SECONDS AND SPIT (Patient taking differently: as needed.), Disp: , Rfl:  [2]  Allergies Allergen Reactions   Fentanyl  Shortness Of Breath, Itching and Nausea And Vomiting    rash   Hydromorphone  Itching   Dilaudid  [Hydromorphone  Hcl] Itching   Morphine  Other (See Comments)   "

## 2024-08-07 NOTE — Telephone Encounter (Signed)
 Pt was in office on 08/07/24 for OV left FMLA paperwork to be completed its not the correct paper work pt was called to fax or bring correct paperwork to office. I sent through my chart pt fax number.

## 2024-08-08 ENCOUNTER — Ambulatory Visit: Admitting: Cardiology

## 2024-08-08 ENCOUNTER — Telehealth: Payer: Self-pay

## 2024-08-08 NOTE — Telephone Encounter (Signed)
"   CLINICAL USE BELOW THIS LINE (use X to signify taken)  __x__Form received and placed in providers office for signature.08/08/24 ____Form completed and faxed to LOA Dept. ____Form completed & LVM to notify pt ready for pick up ____Charge sheet & copy of form in front office folder for office supervisor.   "

## 2024-08-09 ENCOUNTER — Encounter: Payer: Self-pay | Admitting: Cardiology

## 2024-08-09 NOTE — Telephone Encounter (Signed)
"   CLINICAL USE BELOW THIS LINE (use X to signify taken)  ____Form received and placed in providers office for signature. __x__Form completed and faxed to LOA Dept.08/09/24 __x__Form completed & LVM to notify pt ready for pick up 08/09/24 __x__Charge sheet & copy of form in front office folder for office supervisor.  Kristie's office 08/09/24 "

## 2024-08-13 NOTE — Progress Notes (Unsigned)
" °  Cardiology Office Note   Date:  08/13/2024  ID:  Deborah  Kavita, Mcbride 10/05/1985, MRN [id number] PCP: Billy Knee, FNP  [facility] Providers Cardiologist:  Deborah JINNY Lawrence, MD  History of Present Illness Deborah Mcbride is a 39 y.o. female with a past medical history of bradycardia, obesity, OSA on CPAP. Patient presents today for a preoperative evaluation    Patient previously wore a cardiac monitor in 09/2023 that showed predominantly normal sinus rhythm with a HR of 74 BPM. There were rare PVCs and PACs. There was one episode of SVT that lasted 7 beats. She was seen by Dr. Lawrence on 10/12/23 for palpitations. She reported having episodes of low HR into the 40s. Was overall asymptomatic with these episodes and they usually occurred while she was at rest. Patient was reassured, instructed to increase physical activity and work on weight loss. Also recommended that she avoided caffeine for palpitations    Patient now pending bariatric surgery. Cardiology clearance recommended    Preop Recommendations    Bradycardia  - Patient reportedly had episodes of bradycardia with HR to the 40s last December/January. Was overall asymptomatic. Cardiac monitor in 09/2023 showed NSR with an average HR of 74 BPM. Was overall reassuring  -   ROS: ***  Studies Reviewed      *** Risk Assessment/Calculations {Does this patient have ATRIAL FIBRILLATION?[phone]} No BP recorded.  {Refresh Note OR Click here to enter BP  :1}***       Physical Exam VS:  LMP 06/24/2024 (Approximate)        Wt Readings from Last 3 Encounters:  08/07/24 (!) 329 lb 6.4 oz (149.4 kg)  05/22/24 (!) 335 lb 6.4 oz (152.1 kg)  04/18/24 (!) 330 lb 12.8 oz (150 kg)    GEN: Well nourished, well developed in no acute distress NECK: No JVD; No carotid bruits CARDIAC: ***RRR, no murmurs, rubs, gallops RESPIRATORY:  Clear to auscultation without rales, wheezing or rhonchi  ABDOMEN: Soft, non-tender,  non-distended EXTREMITIES:  No edema; No deformity   ASSESSMENT AND PLAN ***    {Are you ordering a CV Procedure (e.g. stress test, cath, DCCV, TEE, etc)?   Press F2        :[id number]}  Dispo: ***  Signed, Rollo FABIENE Louder, PA-C   "

## 2024-08-19 ENCOUNTER — Encounter: Payer: Self-pay | Admitting: Dietician

## 2024-08-19 ENCOUNTER — Encounter: Attending: Surgery | Admitting: Dietician

## 2024-08-19 VITALS — Ht 64.0 in | Wt 326.7 lb

## 2024-08-19 DIAGNOSIS — E88819 Insulin resistance, unspecified: Secondary | ICD-10-CM | POA: Insufficient documentation

## 2024-08-19 DIAGNOSIS — K219 Gastro-esophageal reflux disease without esophagitis: Secondary | ICD-10-CM | POA: Diagnosis present

## 2024-08-19 DIAGNOSIS — E669 Obesity, unspecified: Secondary | ICD-10-CM | POA: Diagnosis present

## 2024-08-19 DIAGNOSIS — E559 Vitamin D deficiency, unspecified: Secondary | ICD-10-CM | POA: Diagnosis present

## 2024-08-19 DIAGNOSIS — D509 Iron deficiency anemia, unspecified: Secondary | ICD-10-CM | POA: Insufficient documentation

## 2024-08-19 NOTE — Progress Notes (Signed)
 Nutrition Assessment for Bariatric Surgery: Pre-Surgery Behavioral and Nutrition Intervention Program   Medical Nutrition Therapy  Appt Start Time: 1604    End Time: 1722  Patient was seen on 08/19/2024 for Pre-Operative Nutrition Assessment. Purpose of todays visit  enhance perioperative outcomes along with a healthy weight maintenance   Referral stated Supervised Weight Loss (SWL) visits needed: 3 months  Planned surgery: Gastric By-Pass Pt expectation of surgery: to be healthy to do more and be around for the children; relief from back and hip pain.  NUTRITION ASSESSMENT   Anthropometrics  Start weight at [facility]: 326.7 lbs (date: 08/19/2024)  Height: 64 in BMI: 56.08 kg/m2     Clinical  Medical hx: obesity, sleep apnea Medications: Prilosec, Zofran  (as needed), lisinopril  (as needed), zepbound , quilipta  Labs: vit D 27.3, iron  saturation 12, triglycerides, HDL 36, VLDL 43 Notable signs/symptoms: none noted Any previous deficiencies? No  Evaluation of Nutritional Deficiencies: Micronutrient Nutrition Focused Physical Exam: Hair: No issues observed Eyes: No issues observed Mouth: No issues observed Neck: No issues observed Nails: No issues observed Skin: No issues observed  Lifestyle & Dietary Hx Pt states she is not taking iron  right now, stating it makes her feel weird. Pt states she needs a new prescription for vit D.  Pt states she would like to get started at the [facility], stating too much sickness going around right now. Pt states she works from home during the day, and also helps her husband, Sherida, at [employer]. 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 Pt states with shots, water fills her up, stating she is not hungry after drinking water. Pt states her Dad is an alcoholic, stating she hates that mess.  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 Pt states she notices a difference. Pt states she goes to [facility].  Current Patient Perceived Stress Level as stated by pt on a scale of 1-10:  6       Stress Management Techniques: listen to christian music, talk to Orlinda (husband)  According to the Dietary Guidelines for Americans Recommendation: equivalent 1.5-2 cups fruits per day, equivalent 2-3 cups vegetables per day and at least half all grains whole  Fruit servings per day (on average): 1-2, meeting 66-100% recommendation  Non-starchy vegetable servings per day (on average): 1-2, meeting 66-100% recommendation  Whole Grains per day (on average): 1  Number of meals missed/skipped per week out of 21: 5-7  24-Hr Dietary Recall First Meal: skip (weekdays) or chicken or sausage biscuit (weekends)... may not eat the biscuit Snack:  Second Meal: raviolli/ beef a roni or chick-fil-a grilled nuggets Snack: sometimes plain potato chips or cookie Third Meal: chicken (grilled) or shrimp, broccoli and carrots or salad Snack: part of a McFlurry sometimes Beverages: diet soda, zero sugar drink, plain water lately  Alcoholic beverages per week: 0   Estimated Energy Needs Calories: 1500  NUTRITION DIAGNOSIS  Overweight/obesity ([place]-3.3) related to past poor dietary habits and physical inactivity as evidenced by patient w/ planned gastric by-pass surgery following dietary guidelines for continued weight loss.  NUTRITION INTERVENTION  Nutrition counseling (C-1) and education (E-2) to facilitate bariatric surgery goals.  Educated pt on micronutrient deficiencies post-surgery and behavioral/dietary  strategies to start in order to mitigate that risk  Behavioral and Dietary Interventions Pre-Op Goals Reviewed with the Patient Nutrition: Healthy Eating Behaviors Switch to non-caloric, non-carbonated and non-caffeinated beverages such as  water, unsweetened tea, Crystal Light and zero calorie beverages (aim for 64 oz. per day) Cut out grazing between meals or at night  Find a protein shake you like Eat every 3-5 hours        Eliminate distractions while eating (TV, computer, reading, driving, texting) Take 79-69 minutes to eat a meal  Decrease high sugar foods/decrease high fat/fried foods Eliminate alcoholic beverages Increase protein intake (eggs, fish, chicken, yogurt) before surgery Eat non starchy vegetables 2 times a day 7 days a week Eat complex carbohydrates such as whole grains and fruits   Behavioral Modification: Physical Activity Increase my usual daily activity (use stairs, park farther, etc.) Engage in _______________________  activity  _______ minutes ______ times per week  Other:    *Goals that are bolded indicate the pt would like to start working towards these  Handouts Provided Include  Bariatric Surgery handouts (Nutrition Visits, Pre Surgery Behavioral Change Goals, Protein Shakes Brands to Choose From, Vitamins & Mineral Supplementation)  Learning Style & Readiness for Change Teaching method utilized: Visual, Auditory, and hands on  Demonstrated degree of understanding via: Teach Back  Readiness Level: preparation Barriers to learning/adherence to lifestyle change: preparation  RD's Notes for Next Visit Patient progress towards chosen goals.   MONITORING & EVALUATION Dietary intake, weekly physical activity, body weight, and preoperative behavioral change goals   Next Steps  Patient is to follow up at [facility] in 2-4 weeks for first SWL visit.

## 2024-08-22 ENCOUNTER — Telehealth: Payer: Self-pay

## 2024-08-22 NOTE — Telephone Encounter (Signed)
"   CLINICAL USE BELOW THIS LINE (use X to signify taken)  __x__Form received and placed in providers office for signature. 08/22/24 ____Form completed and faxed to LOA Dept. ____Form completed & LVM to notify pt ready for pick up ____Charge sheet & copy of form in front office folder for office supervisor.   "

## 2024-08-23 ENCOUNTER — Other Ambulatory Visit: Payer: Self-pay

## 2024-08-23 DIAGNOSIS — E559 Vitamin D deficiency, unspecified: Secondary | ICD-10-CM

## 2024-08-23 MED ORDER — VITAMIN D (ERGOCALCIFEROL) 1.25 MG (50000 UNIT) PO CAPS: 50000.0000 [IU] | ORAL_CAPSULE | ORAL | 1 refills | Status: AC

## 2024-08-23 NOTE — Telephone Encounter (Signed)
 Forms have been faxed pt has been notified. Pt requested copy it is in file in office according to pt last name. At this phone call pt stated she needed refill of Vitamin D , Ergocalciferol , (DRISDOL ) 1.25 MG

## 2024-08-26 ENCOUNTER — Ambulatory Visit: Admitting: Cardiology

## 2024-08-27 NOTE — Telephone Encounter (Signed)
 MC message received from pt:  Good morning are you able to send in a refill for my vitamin D  medication? Im completely out and I need it if you can send it to the community pharmacy on [address] thank you.   RF sent 08/23/24.

## 2024-08-27 NOTE — Telephone Encounter (Addendum)
 See 08/23/24 Med refill encounter.

## 2024-08-28 ENCOUNTER — Telehealth: Payer: Self-pay | Admitting: Pharmacist

## 2024-08-28 ENCOUNTER — Other Ambulatory Visit (HOSPITAL_COMMUNITY): Payer: Self-pay

## 2024-08-28 NOTE — Telephone Encounter (Signed)
 error

## 2024-09-02 ENCOUNTER — Encounter: Admitting: Dietician

## 2024-09-02 ENCOUNTER — Ambulatory Visit (HOSPITAL_COMMUNITY): Payer: Self-pay | Admitting: Licensed Clinical Social Worker

## 2024-09-02 DIAGNOSIS — F4320 Adjustment disorder, unspecified: Secondary | ICD-10-CM

## 2024-09-02 NOTE — Progress Notes (Signed)
 SABRA

## 2024-09-03 ENCOUNTER — Encounter (HOSPITAL_COMMUNITY): Payer: Self-pay | Admitting: Surgery

## 2024-09-05 ENCOUNTER — Encounter: Admitting: Dietician

## 2024-09-05 ENCOUNTER — Telehealth: Payer: Self-pay | Admitting: Neurology

## 2024-09-05 ENCOUNTER — Encounter: Payer: Self-pay | Admitting: Dietician

## 2024-09-05 VITALS — Ht 64.0 in | Wt 325.3 lb

## 2024-09-05 DIAGNOSIS — K219 Gastro-esophageal reflux disease without esophagitis: Secondary | ICD-10-CM

## 2024-09-05 DIAGNOSIS — E669 Obesity, unspecified: Secondary | ICD-10-CM

## 2024-09-05 NOTE — Progress Notes (Signed)
 Supervised Weight Loss Visit Bariatric Nutrition Education Appt Start Time: 1601    End Time: 1635  Planned surgery: Gastric By-Pass Pt expectation of surgery: to be healthy to do more and be around for the children; relief from back and hip pain.  Referral stated Supervised Weight Loss (SWL) visits needed: 3 months  1 out of 3 SWL Appointments  NUTRITION ASSESSMENT   Anthropometrics  Start weight at [facility]: 326.7 lbs (date: 08/19/2024)  Height: 64 in Weight today: 325.3 lbs BMI: 55.84 kg/m2     Clinical  Medical hx: obesity, sleep apnea Medications: Prilosec, Zofran  (as needed), lisinopril  (as needed), zepbound , quilipta  Labs: vit D 27.3, iron  saturation 12, triglycerides, HDL 36, VLDL 43 Notable signs/symptoms: none noted Any previous deficiencies? No  Lifestyle & Dietary Hx Pt states she is drinking more, just plain water. Pt states she has been satisfied with smaller portions, stating she is recognizing satisfaction. Pt states she has been eating breakfast, stating it is difficult. Pt states she wants to prep more at home, stating with the snow/ice storms, there was not much left in the grocery stores. Pt states she ordered a lunch box for herself for meal prepping.  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 may not eat the biscuit just the meat Snack:  Second Meal: raviolli/ beef a roni or chick-fil-a grilled nuggets Snack: sometimes plain potato chips or cookie Third Meal: chicken (grilled) or shrimp, broccoli and carrots or salad Snack: part of a McFlurry sometimes Beverages: diet soda, zero sugar drink/tea, plain water lately   Estimated Energy Needs Calories: 1500  NUTRITION DIAGNOSIS   Overweight/obesity ([place]-3.3) related to past poor dietary habits and physical inactivity as evidenced by patient w/ planned gastric by-pass surgery following dietary guidelines for continued weight loss.  NUTRITION INTERVENTION  Nutrition counseling (C-1) and education (E-2) to facilitate bariatric surgery goals.  Tracking your protein with a goal of about 60 grams per day helps you stay consistent and understand how much youre truly getting from food. A simple guideline is that 1 ounce of cooked meat provides roughly 7 grams of protein, and you can estimate a 3-ounce portion--about 21 grams--by comparing it to the size of a deck of cards. Using the Nutrition Facts label makes tracking easier, since it shows protein per serving and helps you adjust portions as needed. This approach keeps your intake balanced and grounded in real food rather than relying too heavily on supplements.   Protein plays a central role both before and after bariatric surgery because it supports healing, preserves muscle mass, and helps you stay fuller on smaller portions. Before surgery, building the habit of prioritizing protein prepares you for the structured eating pattern youll need afterward. After surgery, when your intake is limited, protein becomes even more important for recovery, maintaining strength, and keeping your metabolism steady. Making protein the anchor of each meal or snack sets you up for long-term success with your nutrition goals.  Pre-Op Goals Progress & New Goals New: meal plan and prep; get to the grocery store (first step) New: track protein; aim for 60 grams per day; aim for a protein with every meal or snack Continue: switch to non-caloric, non-carbonated and non-caffeinated beverages such as  water, unsweetened tea, Crystal Light and zero calorie beverages (  aim for 64 oz. per day) Continue: eat every 3-5 hours Future: continue to practice not drinking with meals (how is that going?)  Handouts  Provided Include  Goals Printed  Learning Style & Readiness for Change Teaching method utilized: Visual & Auditory  Demonstrated degree of understanding via: Teach Back  Readiness Level: preparation Barriers to learning/adherence to lifestyle change: nothing identified  RD's Notes for next Visit  Patient progress towards chosen goals  MONITORING & EVALUATION Dietary intake, weekly physical activity, body weight, and pre-op goals.  Next Steps  Patient is to return to [facility] in 1 month for next SWL visit.

## 2024-09-05 NOTE — Telephone Encounter (Signed)
 Deborah Mcbride  came in wanting to know if the Rx Qulipta   comes in liquid form due to upcoming surgery its requiring her not to swallow or if she can chop it up, or chewable. She is requesting a call back.    PH: [phone]

## 2024-09-06 NOTE — Telephone Encounter (Signed)
 Patient advised of Dr.Jaffe note, Unfortunately there isn't any liquid form. I am not aware or can find any specific guidance on whether it can be crushed, but as long as it is dissolved in water, it should be fine. There is always the possibility that she may not get the full dose, but it would just be a tiny amount loss and nothing significant.   Patient will discuss with her weight loss provider to see if she can try an injection.

## 2024-09-16 ENCOUNTER — Ambulatory Visit (HOSPITAL_COMMUNITY): Admission: RE | Admit: 2024-09-16 | Source: Home / Self Care | Admitting: Surgery

## 2024-09-16 ENCOUNTER — Encounter (HOSPITAL_COMMUNITY): Admission: RE | Payer: Self-pay | Source: Home / Self Care

## 2024-10-03 ENCOUNTER — Encounter: Admitting: Dietician

## 2024-10-16 ENCOUNTER — Ambulatory Visit: Admitting: Internal Medicine

## 2024-12-30 ENCOUNTER — Ambulatory Visit: Admitting: Neurology
